# Patient Record
Sex: Female | Born: 1950 | Race: Black or African American | Hispanic: No | Marital: Single | Smoking: Current every day smoker
Health system: Southern US, Community
[De-identification: ages and names within clinical notes are randomized; demographics above are authoritative.]

## PROBLEM LIST (undated history)

## (undated) DIAGNOSIS — N189 Chronic kidney disease, unspecified: Secondary | ICD-10-CM

## (undated) DIAGNOSIS — E785 Hyperlipidemia, unspecified: Secondary | ICD-10-CM

## (undated) DIAGNOSIS — F17200 Nicotine dependence, unspecified, uncomplicated: Secondary | ICD-10-CM

## (undated) DIAGNOSIS — D241 Benign neoplasm of right breast: Secondary | ICD-10-CM

## (undated) DIAGNOSIS — E119 Type 2 diabetes mellitus without complications: Secondary | ICD-10-CM

## (undated) DIAGNOSIS — I10 Essential (primary) hypertension: Secondary | ICD-10-CM

## (undated) HISTORY — DX: Hyperlipidemia, unspecified: E78.5

## (undated) HISTORY — DX: Essential (primary) hypertension: I10

## (undated) HISTORY — DX: Type 2 diabetes mellitus without complications: E11.9

## (undated) HISTORY — PX: BREAST EXCISIONAL BIOPSY: SUR124

---

## 1998-10-22 ENCOUNTER — Encounter: Payer: Self-pay | Admitting: Family Medicine

## 1998-10-22 ENCOUNTER — Ambulatory Visit (HOSPITAL_COMMUNITY): Admission: RE | Admit: 1998-10-22 | Discharge: 1998-10-22 | Payer: Self-pay | Admitting: Family Medicine

## 1998-12-04 HISTORY — PX: TOTAL ABDOMINAL HYSTERECTOMY: SHX209

## 1998-12-13 ENCOUNTER — Other Ambulatory Visit: Admission: RE | Admit: 1998-12-13 | Discharge: 1998-12-13 | Payer: Self-pay | Admitting: Obstetrics

## 1999-01-05 ENCOUNTER — Inpatient Hospital Stay (HOSPITAL_COMMUNITY): Admission: AD | Admit: 1999-01-05 | Discharge: 1999-01-09 | Payer: Self-pay | Admitting: Family Medicine

## 1999-01-17 ENCOUNTER — Encounter: Admission: RE | Admit: 1999-01-17 | Discharge: 1999-04-17 | Payer: Self-pay | Admitting: Family Medicine

## 1999-01-25 ENCOUNTER — Inpatient Hospital Stay (HOSPITAL_COMMUNITY): Admission: RE | Admit: 1999-01-25 | Discharge: 1999-01-28 | Payer: Self-pay | Admitting: Obstetrics

## 1999-01-25 ENCOUNTER — Encounter: Payer: Self-pay | Admitting: Obstetrics

## 2000-03-01 ENCOUNTER — Other Ambulatory Visit: Admission: RE | Admit: 2000-03-01 | Discharge: 2000-03-01 | Payer: Self-pay | Admitting: Obstetrics

## 2000-03-13 ENCOUNTER — Ambulatory Visit (HOSPITAL_COMMUNITY): Admission: RE | Admit: 2000-03-13 | Discharge: 2000-03-13 | Payer: Self-pay | Admitting: Obstetrics

## 2000-03-13 ENCOUNTER — Encounter: Payer: Self-pay | Admitting: Obstetrics

## 2000-03-20 ENCOUNTER — Encounter: Admission: RE | Admit: 2000-03-20 | Discharge: 2000-03-20 | Payer: Self-pay | Admitting: Obstetrics

## 2000-03-20 ENCOUNTER — Encounter: Payer: Self-pay | Admitting: Obstetrics

## 2002-05-21 ENCOUNTER — Encounter: Payer: Self-pay | Admitting: Family Medicine

## 2002-05-21 ENCOUNTER — Ambulatory Visit (HOSPITAL_COMMUNITY): Admission: RE | Admit: 2002-05-21 | Discharge: 2002-05-21 | Payer: Self-pay | Admitting: Family Medicine

## 2002-09-11 ENCOUNTER — Other Ambulatory Visit: Admission: RE | Admit: 2002-09-11 | Discharge: 2002-09-11 | Payer: Self-pay | Admitting: Family Medicine

## 2008-06-17 ENCOUNTER — Ambulatory Visit: Payer: Self-pay | Admitting: Internal Medicine

## 2008-06-17 LAB — CONVERTED CEMR LAB
ALT: 8 units/L (ref 0–35)
Albumin: 4 g/dL (ref 3.5–5.2)
Basophils Absolute: 0 10*3/uL (ref 0.0–0.1)
CO2: 24 meq/L (ref 19–32)
Calcium: 9.5 mg/dL (ref 8.4–10.5)
Chloride: 101 meq/L (ref 96–112)
Creatinine, Ser: 0.56 mg/dL (ref 0.40–1.20)
Hemoglobin: 11.4 g/dL — ABNORMAL LOW (ref 12.0–15.0)
Lymphocytes Relative: 36 % (ref 12–46)
Monocytes Absolute: 0.3 10*3/uL (ref 0.1–1.0)
Neutro Abs: 2.6 10*3/uL (ref 1.7–7.7)
Neutrophils Relative %: 58 % (ref 43–77)
Potassium: 3.6 meq/L (ref 3.5–5.3)
RBC: 3.87 M/uL (ref 3.87–5.11)
RDW: 13.5 % (ref 11.5–15.5)
TSH: 1.273 microintl units/mL (ref 0.350–4.50)
Total Protein: 7 g/dL (ref 6.0–8.3)

## 2008-06-18 ENCOUNTER — Ambulatory Visit (HOSPITAL_COMMUNITY): Admission: RE | Admit: 2008-06-18 | Discharge: 2008-06-18 | Payer: Self-pay | Admitting: Family Medicine

## 2008-06-22 ENCOUNTER — Ambulatory Visit (HOSPITAL_COMMUNITY): Admission: RE | Admit: 2008-06-22 | Discharge: 2008-06-22 | Payer: Self-pay | Admitting: Internal Medicine

## 2008-06-24 ENCOUNTER — Ambulatory Visit: Payer: Self-pay | Admitting: Internal Medicine

## 2008-06-29 ENCOUNTER — Ambulatory Visit: Payer: Self-pay | Admitting: *Deleted

## 2008-07-10 ENCOUNTER — Ambulatory Visit: Payer: Self-pay | Admitting: Internal Medicine

## 2008-12-17 ENCOUNTER — Ambulatory Visit: Payer: Self-pay | Admitting: Internal Medicine

## 2008-12-17 LAB — CONVERTED CEMR LAB
AST: 10 units/L (ref 0–37)
Albumin: 4 g/dL (ref 3.5–5.2)
Alkaline Phosphatase: 45 units/L (ref 39–117)
Basophils Relative: 0 % (ref 0–1)
Eosinophils Absolute: 0.1 10*3/uL (ref 0.0–0.7)
Eosinophils Relative: 2 % (ref 0–5)
HCT: 36.4 % (ref 36.0–46.0)
HDL: 59 mg/dL (ref >39)
LDL Cholesterol: 109 mg/dL — ABNORMAL HIGH (ref 0–99)
MCHC: 34.1 g/dL (ref 30.0–36.0)
MCV: 87.5 fL (ref 78.0–100.0)
Neutrophils Relative %: 41 % — ABNORMAL LOW (ref 43–77)
Platelets: 230 10*3/uL (ref 150–400)
Potassium: 3.7 meq/L (ref 3.5–5.3)
RDW: 13.5 % (ref 11.5–15.5)
Sodium: 144 meq/L (ref 135–145)
Total Bilirubin: 0.7 mg/dL (ref 0.3–1.2)
Total Protein: 7 g/dL (ref 6.0–8.3)
Triglycerides: 77 mg/dL (ref <150)
VLDL: 15 mg/dL (ref 0–40)

## 2008-12-18 ENCOUNTER — Encounter (INDEPENDENT_AMBULATORY_CARE_PROVIDER_SITE_OTHER): Payer: Self-pay | Admitting: Internal Medicine

## 2008-12-23 ENCOUNTER — Ambulatory Visit: Payer: Self-pay | Admitting: Internal Medicine

## 2008-12-24 ENCOUNTER — Ambulatory Visit (HOSPITAL_COMMUNITY): Admission: RE | Admit: 2008-12-24 | Discharge: 2008-12-24 | Payer: Self-pay | Admitting: Internal Medicine

## 2009-01-20 ENCOUNTER — Ambulatory Visit: Payer: Self-pay | Admitting: Internal Medicine

## 2009-01-20 ENCOUNTER — Encounter: Payer: Self-pay | Admitting: Internal Medicine

## 2009-02-01 ENCOUNTER — Encounter: Admission: RE | Admit: 2009-02-01 | Discharge: 2009-02-25 | Payer: Self-pay | Admitting: Internal Medicine

## 2009-07-07 ENCOUNTER — Ambulatory Visit (HOSPITAL_COMMUNITY): Admission: RE | Admit: 2009-07-07 | Discharge: 2009-07-07 | Payer: Self-pay | Admitting: Internal Medicine

## 2009-08-05 ENCOUNTER — Ambulatory Visit: Payer: Self-pay | Admitting: Internal Medicine

## 2009-08-12 ENCOUNTER — Ambulatory Visit: Payer: Self-pay | Admitting: Internal Medicine

## 2009-08-30 ENCOUNTER — Ambulatory Visit: Payer: Self-pay | Admitting: Internal Medicine

## 2009-08-30 LAB — CONVERTED CEMR LAB
ALT: <8 units/L (ref 0–35)
AST: 11 units/L (ref 0–37)
CO2: 24 meq/L (ref 19–32)
Chloride: 110 meq/L (ref 96–112)
Cholesterol: 118 mg/dL (ref 0–200)
Creatinine, Ser: 0.71 mg/dL (ref 0.40–1.20)
Sodium: 144 meq/L (ref 135–145)
Total Bilirubin: 0.5 mg/dL (ref 0.3–1.2)
Total CHOL/HDL Ratio: 2.2
Total Protein: 6.7 g/dL (ref 6.0–8.3)

## 2009-09-09 ENCOUNTER — Ambulatory Visit: Payer: Self-pay | Admitting: Internal Medicine

## 2009-09-09 LAB — CONVERTED CEMR LAB
Cholesterol: 109 mg/dL (ref 0–200)
HDL: 59 mg/dL (ref >39)
Triglycerides: 50 mg/dL (ref <150)

## 2010-03-30 ENCOUNTER — Ambulatory Visit: Payer: Self-pay | Admitting: Family Medicine

## 2010-06-29 ENCOUNTER — Ambulatory Visit: Payer: Self-pay | Admitting: Internal Medicine

## 2010-08-11 ENCOUNTER — Ambulatory Visit: Payer: Self-pay | Admitting: Internal Medicine

## 2010-08-11 LAB — CONVERTED CEMR LAB
BUN: 22 mg/dL (ref 6–23)
CO2: 25 meq/L (ref 19–32)
Chloride: 107 meq/L (ref 96–112)
Creatinine, Ser: 0.88 mg/dL (ref 0.40–1.20)
Glucose, Bld: 138 mg/dL — ABNORMAL HIGH (ref 70–99)
Hgb A1c MFr Bld: 6.2 % — ABNORMAL HIGH (ref <5.7)
LDL Cholesterol: 56 mg/dL (ref 0–99)
Potassium: 5.3 meq/L (ref 3.5–5.3)
VLDL: 11 mg/dL (ref 0–40)

## 2010-09-01 ENCOUNTER — Ambulatory Visit (HOSPITAL_COMMUNITY): Admission: RE | Admit: 2010-09-01 | Discharge: 2010-09-01 | Payer: Self-pay | Admitting: Internal Medicine

## 2011-09-11 ENCOUNTER — Other Ambulatory Visit (HOSPITAL_COMMUNITY): Payer: Self-pay | Admitting: Family Medicine

## 2011-09-11 DIAGNOSIS — Z1231 Encounter for screening mammogram for malignant neoplasm of breast: Secondary | ICD-10-CM

## 2011-09-22 ENCOUNTER — Ambulatory Visit (HOSPITAL_COMMUNITY)
Admission: RE | Admit: 2011-09-22 | Discharge: 2011-09-22 | Disposition: A | Discharge status: Home / Self Care | Payer: Self-pay | Source: Ambulatory Visit | Attending: Family Medicine | Admitting: Family Medicine

## 2011-09-22 DIAGNOSIS — Z1231 Encounter for screening mammogram for malignant neoplasm of breast: Secondary | ICD-10-CM | POA: Insufficient documentation

## 2012-12-26 ENCOUNTER — Emergency Department (HOSPITAL_COMMUNITY)
Admission: EM | Admit: 2012-12-26 | Discharge: 2012-12-26 | Disposition: A | Discharge status: Home / Self Care | Payer: No Typology Code available for payment source | Source: Home / Self Care | Attending: Family Medicine | Admitting: Family Medicine

## 2012-12-26 ENCOUNTER — Encounter (HOSPITAL_COMMUNITY): Payer: Self-pay

## 2012-12-26 DIAGNOSIS — I10 Essential (primary) hypertension: Secondary | ICD-10-CM

## 2012-12-26 DIAGNOSIS — E119 Type 2 diabetes mellitus without complications: Secondary | ICD-10-CM

## 2012-12-26 DIAGNOSIS — E785 Hyperlipidemia, unspecified: Secondary | ICD-10-CM

## 2012-12-26 LAB — LIPID PANEL
Cholesterol: 124 mg/dL (ref 0–200)
HDL: 61 mg/dL (ref >39)
LDL Cholesterol: 54 mg/dL (ref 0–99)
Triglycerides: 45 mg/dL (ref <150)

## 2012-12-26 LAB — COMPREHENSIVE METABOLIC PANEL
ALT: 8 U/L (ref 0–35)
Alkaline Phosphatase: 61 U/L (ref 39–117)
Chloride: 107 mEq/L (ref 96–112)
GFR calc Af Amer: 43 mL/min — ABNORMAL LOW (ref >90)
Glucose, Bld: 74 mg/dL (ref 70–99)
Potassium: 5.3 mEq/L — ABNORMAL HIGH (ref 3.5–5.1)
Sodium: 142 mEq/L (ref 135–145)
Total Bilirubin: 0.2 mg/dL — ABNORMAL LOW (ref 0.3–1.2)
Total Protein: 8.4 g/dL — ABNORMAL HIGH (ref 6.0–8.3)

## 2012-12-26 MED ORDER — METFORMIN HCL 1000 MG PO TABS: 1000.0000 mg | ORAL_TABLET | Freq: Two times a day (BID) | ORAL | Status: DC

## 2012-12-26 MED ORDER — GLIPIZIDE 5 MG PO TABS: 5.0000 mg | ORAL_TABLET | ORAL | Status: DC

## 2012-12-26 MED ORDER — HYDROCHLOROTHIAZIDE 12.5 MG PO TABS: 12.5000 mg | ORAL_TABLET | Freq: Every day | ORAL | Status: DC

## 2012-12-26 MED ORDER — PRAVASTATIN SODIUM 20 MG PO TABS: 20.0000 mg | ORAL_TABLET | Freq: Every day | ORAL | Status: DC

## 2012-12-26 MED ORDER — LISINOPRIL 40 MG PO TABS: 40.0000 mg | ORAL_TABLET | Freq: Every day | ORAL | Status: DC

## 2012-12-26 NOTE — ED Notes (Signed)
[facility] client- history of DM hypertension-needs medication refills

## 2012-12-26 NOTE — Progress Notes (Signed)
Quick Note:  Please call patient and later her kidney function has come back abnormal. Her kidneys function is diminished. This may be from acute dehydration but I'm recommending that she stop taking the metformin and lisinopril for the next several days. Don't restart taking the lisinopril or the metformin until she's had repeat labs done and we tell her that is okay to restart it. I would like for her to continue taking her hydrochlorothiazide but I would like for her to take 2 tablets daily because her potassium is elevated at this time. I would like for her to drink a lot of clear fluids for the next several days to flush her kidneys and I would like her to return to have a lab draw to have her labs rechecked in one week. Her kidney function from 2011 was normal. I am not sure if her kidneys have been declining over the past 2 years because we don't have any labs from 2013 or 2012 to compare.   Gerlene Fee, MD, CDE, [place], [place]   ______

## 2012-12-26 NOTE — Progress Notes (Signed)
Quick Note:  I called patient and notified her of the results and to have her stop lisinopril and metformin until we are able to recheck her labs next week after she rehydrates. She says she was never told that she had any renal insufficiency. Please call patient and have her come in to the office for a nurse visit for labs in 1 week. She needs a CMP test done.    Gerlene Fee, MD, CDE, [place], [place]   ______

## 2012-12-26 NOTE — ED Provider Notes (Signed)
History     CSN: [id number]  Arrival date & time 12/26/12  1034   First MD Initiated Contact with Patient 12/26/12 1120      Chief Complaint  Patient presents with  . Medication Refill    (Consider location/radiation/quality/duration/timing/severity/associated sxs/prior treatment) HPI Pt says that she has been working hard to control her diet.  Pt says that she is taking her medications as prescribed.  She had her labs done with [facility] but it is time to have labs done again.      History reviewed. No pertinent past medical history.  History reviewed. No pertinent past surgical history.  No family history on file.  History  Substance Use Topics  . Smoking status: Light Tobacco Smoker  . Smokeless tobacco: Not on file  . Alcohol Use: No    OB History    Grav Para Term Preterm Abortions TAB SAB Ect Mult Living                 Review of Systems  HENT: Positive for congestion and sneezing.   Musculoskeletal: Positive for arthralgias.  All other systems reviewed and are negative.    Allergies  Review of patient's allergies indicates no known allergies.  Home Medications   Current Outpatient Rx  Name  Route  Sig  Dispense  Refill  . ASPIRIN 81 MG PO TABS   Oral   Take 81 mg by mouth daily.         . ATORVASTATIN CALCIUM 10 MG PO TABS   Oral   Take 10 mg by mouth daily.         Marland Kitchen GLIPIZIDE 5 MG PO TABS   Oral   Take 5 mg by mouth 1 day or 1 dose.         Marland Kitchen LISINOPRIL 40 MG PO TABS   Oral   Take 40 mg by mouth daily.         Marland Kitchen METFORMIN HCL ER (OSM) 1000 MG PO TB24   Oral   Take 1,000 mg by mouth 2 (two) times daily with a meal.           BP 172/72  Pulse 76  Temp 98.1 F (36.7 C) (Oral)  Resp 18  SpO2 100%  Physical Exam  Nursing note and vitals reviewed. Constitutional: She is oriented to person, place, and time. She appears well-developed and well-nourished.  HENT:  Head: Normocephalic and atraumatic.  Eyes: EOM are normal.  Pupils are equal, round, and reactive to light.  Neck: Normal range of motion. Neck supple.  Cardiovascular: Normal rate, regular rhythm and normal heart sounds.   Pulmonary/Chest: Effort normal and breath sounds normal.  Abdominal: Soft. Bowel sounds are normal.  Musculoskeletal: Normal range of motion. She exhibits no edema and no tenderness.  Neurological: She is alert and oriented to person, place, and time.  Skin: Skin is warm and dry.  Psychiatric: She has a normal mood and affect. Her behavior is normal. Judgment and thought content normal.    ED Course  Procedures (including critical care time)  Labs Reviewed - No data to display No results found.   No diagnosis found.   MDM  IMPRESSION  HTN  Hyperlipidemia  Type 2 DM  RECOMMENDATIONS / PLAN Check labs today Added HCTZ 12.5 mg po daily Refilled lisinopril 40 mg po daily and pravastatin Followup lab results  FOLLOW UP 3 months   The patient was given clear instructions to go to ER or return to  medical center if symptoms don't improve, worsen or new problems develop.  The patient verbalized understanding.  The patient was told to call to get lab results if they haven't heard anything in the next week.            Murlean Iba, MD 12/26/12 1216

## 2012-12-31 ENCOUNTER — Telehealth (HOSPITAL_COMMUNITY): Payer: Self-pay

## 2012-12-31 NOTE — Telephone Encounter (Signed)
Message copied by Dorothe Pea on Tue Dec 31, 2012  9:43 AM ------      Message from: Murlean Iba      Created: Thu Dec 26, 2012  6:51 PM       Please call patient and later her kidney function has come back abnormal.  Her kidneys function is diminished.  This may be from acute dehydration but I'm recommending that she stop taking the metformin and lisinopril for the next several days.  Don't restart taking the lisinopril or the metformin until she's had repeat labs done and we tell her that is okay to restart it.  I would like for her to continue taking her hydrochlorothiazide but I would like for her to take 2 tablets daily because her potassium is elevated at this time.  I would like for her to drink a lot of clear fluids for the next several days to flush her kidneys and I would like her to return to have a lab draw to have her labs rechecked in one week.  Her kidney function from 2011 was normal.  I am not sure if her kidneys have been declining over the past 2 years because we don't have any labs from 2013 or 2012 to compare.                  Gerlene Fee, MD, CDE, [place], [place]

## 2013-01-06 ENCOUNTER — Emergency Department (INDEPENDENT_AMBULATORY_CARE_PROVIDER_SITE_OTHER)
Admission: EM | Admit: 2013-01-06 | Discharge: 2013-01-06 | Disposition: A | Discharge status: Home / Self Care | Payer: No Typology Code available for payment source | Source: Home / Self Care

## 2013-01-06 ENCOUNTER — Encounter (HOSPITAL_COMMUNITY): Payer: Self-pay

## 2013-01-06 DIAGNOSIS — I10 Essential (primary) hypertension: Secondary | ICD-10-CM

## 2013-01-06 LAB — COMPREHENSIVE METABOLIC PANEL
AST: 19 U/L (ref 0–37)
CO2: 24 mEq/L (ref 19–32)
Calcium: 10.1 mg/dL (ref 8.4–10.5)
Creatinine, Ser: 1.33 mg/dL — ABNORMAL HIGH (ref 0.50–1.10)
GFR calc Af Amer: 48 mL/min — ABNORMAL LOW (ref >90)
GFR calc non Af Amer: 42 mL/min — ABNORMAL LOW (ref >90)
Glucose, Bld: 63 mg/dL — ABNORMAL LOW (ref 70–99)
Total Protein: 8.2 g/dL (ref 6.0–8.3)

## 2013-01-06 NOTE — ED Notes (Signed)
Pat;ient here for a repeat blood test-cmp

## 2013-01-08 NOTE — Progress Notes (Signed)
Quick Note:  Please call patient and tell her that her kidney function still came back abnormal. Her kidneys have been damaged by her high blood pressure and diabetes. She needs to [place] now because of her diminished kidney function. She can take her blood pressure meds. Her potassium has returned to normal. She should be taking 25 mg of HCTZ (hydrochlorothiazide) now as we had discussed on the phone and lisinopril. She needs to come in for an office visit in 1 month and get labs done when you come in to the clinic in 1 month to recheck BMP.   Gerlene Fee, MD, CDE, [place], [place]   ______

## 2013-01-09 ENCOUNTER — Telehealth (HOSPITAL_COMMUNITY): Payer: Self-pay

## 2013-01-09 NOTE — Telephone Encounter (Signed)
Message copied by Dorothe Pea on Thu Jan 09, 2013  9:30 AM ------      Message from: Murlean Iba      Created: Wed Jan 08, 2013  8:47 AM       Please call patient and tell her that her kidney function still came back abnormal.  Her kidneys have been damaged by her high blood pressure and diabetes.  She needs to [place] now because of her diminished kidney function.  She can take her blood pressure meds.  Her potassium has returned to normal. She should be taking 25 mg of HCTZ (hydrochlorothiazide) now as we had discussed on the phone and lisinopril.  She needs to come in for an office visit in 1 month and get labs done when you come in to the clinic in 1 month to recheck BMP.              Gerlene Fee, MD, CDE, [place], [place]

## 2013-05-07 ENCOUNTER — Ambulatory Visit: Payer: No Typology Code available for payment source | Attending: Family Medicine | Admitting: Family Medicine

## 2013-05-07 VITALS — BP 200/84 | HR 82 | Temp 98.7°F | Resp 14 | Ht 61.75 in | Wt 141.0 lb

## 2013-05-07 DIAGNOSIS — N184 Chronic kidney disease, stage 4 (severe): Secondary | ICD-10-CM | POA: Insufficient documentation

## 2013-05-07 DIAGNOSIS — N183 Chronic kidney disease, stage 3 unspecified: Secondary | ICD-10-CM

## 2013-05-07 DIAGNOSIS — F17200 Nicotine dependence, unspecified, uncomplicated: Secondary | ICD-10-CM

## 2013-05-07 DIAGNOSIS — N189 Chronic kidney disease, unspecified: Secondary | ICD-10-CM | POA: Insufficient documentation

## 2013-05-07 DIAGNOSIS — I10 Essential (primary) hypertension: Secondary | ICD-10-CM

## 2013-05-07 DIAGNOSIS — E119 Type 2 diabetes mellitus without complications: Secondary | ICD-10-CM

## 2013-05-07 DIAGNOSIS — I129 Hypertensive chronic kidney disease with stage 1 through stage 4 chronic kidney disease, or unspecified chronic kidney disease: Secondary | ICD-10-CM | POA: Insufficient documentation

## 2013-05-07 LAB — LIPID PANEL
HDL: 62 mg/dL (ref >39)
LDL Cholesterol: 86 mg/dL (ref 0–99)
Triglycerides: 48 mg/dL (ref <150)
VLDL: 10 mg/dL (ref 0–40)

## 2013-05-07 LAB — COMPREHENSIVE METABOLIC PANEL
ALT: <8 U/L (ref 0–35)
AST: 15 U/L (ref 0–37)
Alkaline Phosphatase: 46 U/L (ref 39–117)
Creat: 1.16 mg/dL — ABNORMAL HIGH (ref 0.50–1.10)
Total Bilirubin: 0.3 mg/dL (ref 0.3–1.2)

## 2013-05-07 MED ORDER — GLIPIZIDE 5 MG PO TABS: 5.0000 mg | ORAL_TABLET | Freq: Every day | ORAL | Status: DC

## 2013-05-07 MED ORDER — ASPIRIN 81 MG PO TABS: 81.0000 mg | ORAL_TABLET | Freq: Every day | ORAL | Status: DC

## 2013-05-07 MED ORDER — HYDROCHLOROTHIAZIDE 12.5 MG PO TABS: 12.5000 mg | ORAL_TABLET | Freq: Every day | ORAL | Status: DC

## 2013-05-07 MED ORDER — LISINOPRIL 40 MG PO TABS: 40.0000 mg | ORAL_TABLET | Freq: Every day | ORAL | Status: DC

## 2013-05-07 MED ORDER — PRAVASTATIN SODIUM 20 MG PO TABS: 20.0000 mg | ORAL_TABLET | Freq: Every day | ORAL | Status: DC

## 2013-05-07 NOTE — Patient Instructions (Signed)
Low Blood Sugar Low blood sugar (hypoglycemia) means that the level of sugar in your blood is lower than it should be. Signs of low blood sugar include:  Getting sweaty.  Feeling hungry.  Feeling dizzy or weak.  Feeling sleepier than normal.  Feeling nervous.  Headaches.  Having a fast heartbeat. Low blood sugar can happen fast and can be an emergency. Your doctor can do tests to check your blood sugar level. You can have low blood sugar and not have diabetes. HOME CARE  Check your blood sugar as told by your doctor. If it is less than 70 mg/dl or as told by your doctor, take 1 of the following:  3 to 4 glucose tablets.   cup clear juice.   cup soda pop, not diet.  1 cup milk.  5 to 6 hard candies.  Recheck blood sugar after 15 minutes. Repeat until it is at the right level.  Eat a snack if it is more than 1 hour until the next meal.  Only take medicine as told by your doctor.  Do not skip meals. Eat on time.  Do not drink alcohol except with meals.  Check your blood glucose before driving.  Check your blood glucose before and after exercise.  Always carry treatment with you, such as glucose pills.  Always wear a medical alert bracelet if you have diabetes. GET HELP RIGHT AWAY IF:   Your blood glucose goes below 70 mg/dl or as told by your doctor, and you:  Are confused.  Are not able to swallow.  Pass out (faint).  You cannot treat yourself. You may need someone to help you.  You have low blood sugar problems often.  You have problems from your medicines.  You are not feeling better after 3 to 4 days.  You have vision changes. MAKE SURE YOU:   Understand these instructions.  Will watch this condition.  Will get help right away if you are not doing well or get worse. Document Released: 02/14/2010 Document Revised: 02/12/2012 Document Reviewed: 02/14/2010 [facility] Patient Information 2014 [place], [place]. Blood Sugar Monitoring,  Adult GLUCOSE METERS FOR SELF-MONITORING OF BLOOD GLUCOSE  It is important to be able to correctly measure your blood sugar (glucose). You can use a blood glucose monitor (a small battery-operated device) to check your glucose level at any time. This allows you and your caregiver to monitor your diabetes and to determine how well your treatment plan is working. The process of monitoring your blood glucose with a glucose meter is called self-monitoring of blood glucose (SMBG). When people with diabetes control their blood sugar, they have better health. To test for glucose with a typical glucose meter, place the disposable strip in the meter. Then place a small sample of blood on the "test strip." The test strip is coated with chemicals that combine with glucose in blood. The meter measures how much glucose is present. The meter displays the glucose level as a number. Several new models can record and store a number of test results. Some models can connect to personal computers to store test results or print them out.  Newer meters are often easier to use than older models. Some meters allow you to get blood from places other than your fingertip. Some new models have automatic timing, error codes, signals, or barcode readers to help with proper adjustment (calibration). Some meters have a large display screen or spoken instructions for people with visual impairments.  INSTRUCTIONS FOR USING GLUCOSE METERS  Wash your hands with soap and warm water, or clean the area with alcohol. Dry your hands completely.  Prick the side of your fingertip with a lancet (a sharp-pointed tool used by hand).  Hold the hand down and gently milk the finger until a small drop of blood appears. Catch the blood with the test strip.  Follow the instructions for inserting the test strip and using the SMBG meter. Most meters require the meter to be turned on and the test strip to be inserted before applying the blood  sample.  Record the test result.  Read the instructions carefully for both the meter and the test strips that go with it. Meter instructions are found in the user manual. Keep this manual to help you solve any problems that may arise. Many meters use "error codes" when there is a problem with the meter, the test strip, or the blood sample on the strip. You will need the manual to understand these error codes and fix the problem.  New devices are available such as laser lancets and meters that can test blood taken from "alternative sites" of the body, other than fingertips. However, you should use standard fingertip testing if your glucose changes rapidly. Also, use standard testing if:  You have eaten, exercised, or taken insulin in the past 2 hours.  You think your glucose is low.  You tend to not feel symptoms of low blood glucose (hypoglycemia).  You are ill or under stress.  Clean the meter as directed by the manufacturer.  Test the meter for accuracy as directed by the manufacturer.  Take your meter with you to your caregiver's office. This way, you can test your glucose in front of your caregiver to make sure you are using the meter correctly. Your caregiver can also take a sample of blood to test using a routine lab method. If values on the glucose meter are close to the lab results, you and your caregiver will see that your meter is working well and you are using good technique. Your caregiver will advise you about what to do if the results do not match. FREQUENCY OF TESTING  Your caregiver will tell you how often you should check your blood glucose. This will depend on your type of diabetes, your current level of diabetes control, and your types of medicines. The following are general guidelines, but your care plan may be different. Record all your readings and the time of day you took them for review with your caregiver.   Diabetes type 1.  When you are using insulin with good  diabetic control (either multiple daily injections or via a pump), you should check your glucose 4 times a day.  If your diabetes is not well controlled, you may need to monitor more frequently, including before meals and 2 hours after meals, at bedtime, and occasionally between 2 a.m. and 3 a.m.  You should always check your glucose before a dose of insulin or before changing the rate on your insulin pump.  Diabetes type 2.  Guidelines for SMBG in diabetes type 2 are not as well defined.  If you are on insulin, follow the guidelines above.  If you are on medicines, but not insulin, and your glucose is not well controlled, you should test at least twice daily.  If you are not on insulin, and your diabetes is controlled with medicines or diet alone, you should test at least once daily, usually before breakfast.  A weekly profile will  help your caregiver advise you on your care plan. The week before your visit, check your glucose before a meal and 2 hours after a meal at least daily. You may want to test before and after a different meal each day so you and your caregiver can tell how well controlled your blood sugars are throughout the course of a 24 hour period.  Gestational diabetes (diabetes during pregnancy).  Frequent testing is often necessary. Accurate timing is important.  If you are not on insulin, check your glucose 4 times a day. Check it before breakfast and 1 hour after the start of each meal.  If you are on insulin, check your glucose 6 times a day. Check it before each meal and 1 hour after the first bite of each meal.  General guidelines.  More frequent testing is required at the start of insulin treatment. Your caregiver will instruct you.  Test your glucose any time you suspect you have low blood sugar (hypoglycemia).  You should test more often when you change medicines, when you have unusual stress or illness, or in other unusual circumstances. OTHER THINGS TO KNOW  ABOUT GLUCOSE METERS  Measurement Range. Most glucose meters are able to read glucose levels over a broad range of values from as low as 0 to as high as 600 mg/dL. If you get an extremely high or low reading from your meter, you should first confirm it with another reading. Report very high or very low readings to your caregiver.  Whole Blood Glucose versus Plasma Glucose. Some older home glucose meters measure glucose in your whole blood. In a lab or when using some newer home glucose meters, the glucose is measured in your plasma (one component of blood). The difference can be important. It is important for you and your caregiver to know whether your meter gives its results as "whole blood equivalent" or "plasma equivalent."  Display of High and Low Glucose Values. Part of learning how to operate a meter is understanding what the meter results mean. Know how high and low glucose concentrations are displayed on your meter.  Factors that Affect Glucose Meter Performance. The accuracy of your test results depends on many factors and varies depending on the brand and type of meter. These factors include:  Low red blood cell count (anemia).  Substances in your blood (such as uric acid, vitamin C, and others).  Environmental factors (temperature, humidity, altitude).  Name-brand versus generic test strips.  Calibration. Make sure your meter is set up properly. It is a good idea to do a calibration test with a control solution recommended by the manufacturer of your meter whenever you begin using a fresh bottle of test strips. This will help verify the accuracy of your meter.  Improperly stored, expired, or defective test strips. Keep your strips in a dry place with the lid on.  Soiled meter.  Inadequate blood sample. NEW TECHNOLOGIES FOR GLUCOSE TESTING Alternative site testing Some glucose meters allow testing blood from alternative sites. These include the:  Upper arm.  Forearm.  Base  of the thumb.  Thigh. Sampling blood from alternative sites may be desirable. However, it may have some limitations. Blood in the fingertips show changes in glucose levels more quickly than blood in other parts of the body. This means that alternative site test results may be different from fingertip test results, not because of the meter's ability to test accurately, but because the actual glucose concentration can be different.  Continuous Glucose  Monitoring Devices to measure your blood glucose continuously are available, and others are in development. These methods can be more expensive than self-monitoring with a glucose meter. However, it is uncertain how effective and reliable these devices are. Your caregiver will advise you if this approach makes sense for you. IF BLOOD SUGARS ARE CONTROLLED, PEOPLE WITH DIABETES REMAIN HEALTHIER.  SMBG is an important part of the treatment plan of patients with diabetes mellitus. Below are reasons for using SMBG:   It confirms that your glucose is at a specific, healthy level.  It detects hypoglycemia and severe hyperglycemia.  It allows you and your caregiver to make adjustments in response to changes in lifestyle for individuals requiring medicine.  It determines the need for starting insulin therapy in temporary diabetes that happens during pregnancy (gestational diabetes). Document Released: 11/23/2003 Document Revised: 02/12/2012 Document Reviewed: 03/16/2011 [facility] Patient Information 2014 [place].  Hypertension Hypertension is another name for high blood pressure. High blood pressure may mean that your heart needs to work harder to pump blood. Blood pressure consists of two numbers, which includes a higher number over a lower number (example: 110/72). HOME CARE   Make lifestyle changes as told by your doctor. This may include weight loss and exercise.  Take your blood pressure medicine every day.  Limit how much salt you  use.  Stop smoking if you smoke.  Do not use drugs.  Talk to your doctor if you are using decongestants or birth control pills. These medicines might make blood pressure higher.  Females should not drink more than 1 alcoholic drink per day. Males should not drink more than 2 alcoholic drinks per day.  See your doctor as told. GET HELP RIGHT AWAY IF:   You have a blood pressure reading with a top number of 180 or higher.  You get a very bad headache.  You get blurred or changing vision.  You feel confused.  You feel weak, numb, or faint.  You get chest or belly (abdominal) pain.  You throw up (vomit).  You cannot breathe very well. MAKE SURE YOU:   Understand these instructions.  Will watch your condition.  Will get help right away if you are not doing well or get worse. Document Released: 05/08/2008 Document Revised: 02/12/2012 Document Reviewed: 05/08/2008 [facility] Patient Information 2014 [place], [place]. Chronic Kidney Disease Chronic kidney disease occurs when the kidneys are damaged over a long period. The kidneys are two organs that lie on either side of the spine between the middle of the back and the front of the abdomen. The kidneys:   Remove wastes and extra water from the blood.   Produce important hormones. These help keep bones strong, regulate blood pressure, and help create red blood cells.   Balance the fluids and chemicals in the blood and tissues. A small amount of kidney damage may not cause problems, but a large amount of damage may make it difficult or impossible for the kidneys to work the way they should. If steps are not taken to slow down the kidney damage or stop it from getting worse, the kidneys may stop working permanently. Most of the time, chronic kidney disease does not go away. However, it can often be controlled, and those with the disease can usually live normal lives. CAUSES  The most common causes of chronic kidney disease are  diabetes and high blood pressure (hypertension). Chronic kidney disease may also be caused by:   Diseases that cause kidneys' filters to become  inflamed.   Diseases that affect the immune system.   Genetic diseases.   Medicines that damage the kidneys, such as anti-inflammatory medicines.  Poisoning or exposure to toxic substances.   A reoccurring kidney or urinary infection.   A problem with urine flow. This may be caused by:   Cancer.   Kidney stones.   An enlarged prostate in males. SYMPTOMS  Because the kidney damage in chronic kidney disease occurs slowly, symptoms develop slowly and may not be obvious until the kidney damage becomes severe. A person may have a kidney disease for years without showing any symptoms. Symptoms can include:   Swelling (edema) of the legs, ankles, or feet.   Tiredness (lethargy).   Nausea or vomiting.   Confusion.   Problems with urination, such as:   Decreased urine production.   Frequent urination, especially at night.   Frequent accidents in children who are potty trained.   Muscle twitches and cramps.   Shortness of breath.  Weakness.   Persistent itchiness.   Loss of appetite.  Metallic taste in the mouth.  Trouble sleeping.  Slowed development in children.  Short stature in children. DIAGNOSIS  Chronic kidney disease may be detected and diagnosed by tests, including blood, urine, imaging, or kidney biopsy tests.  TREATMENT  Most chronic kidney diseases cannot be cured. Treatment usually involves relieving symptoms and preventing or slowing the progression of the disease. Treatment may include:   A special diet. You may need to avoid alcohol and foods thatare salty and high in potassium.   Medicines. These may:   Lower blood pressure.   Relieve anemia.   Relieve swelling.   Protect the bones. HOME CARE INSTRUCTIONS   Follow your prescribed diet.   Only take over-the-counter  or prescription medicines as directed by your caregiver.  Do not take any new medicines (prescription, over-the-counter, or nutritional supplements) unless approved by your caregiver. Many medicines can worsen your kidney damage or need to have the dose adjusted.   Quit smoking if you are a smoker. Talk to your caregiver about a smoking cessation program.   Keep all follow-up appointments as directed by your caregiver. SEEK IMMEDIATE MEDICAL CARE IF:  Your symptoms get worse or you develop new symptoms.   You develop symptoms of end-stage kidney disease. These include:   Headaches.   Abnormally dark or light skin.   Numbness in the hands or feet.   Easy bruising.   Frequent hiccups.   Menstruation stops.   You have a fever.   You have decreased urine production.   You havepain or bleeding when urinating. MAKE SURE YOU:  Understand these instructions.  Will watch your condition.  Will get help right away if you are not doing well or get worse. FOR MORE INFORMATION  American Association of Kidney Patients: BombTimer.gl National Kidney Foundation: www.kidney.[place]: https://mathis.com/ [facility]: www.lifeoptions.org and www.kidneyschool.org Document Released: 08/29/2008 Document Revised: 11/06/2012 Document Reviewed: 07/19/2012 [facility] Patient Information 2014 [place], [place].

## 2013-05-07 NOTE — Progress Notes (Signed)
Patient ID: Andrea Brown, female   DOB: 1951/04/01, 62 y.o.   MRN: [id number]  CC: follow up   HPI: Pt reports that she has been without her medications for several days and requesting refills.  Pt says that she has not been having headaches but no CP, no SOB.  She is still smoking but not willing to try quitting at this time.  Pt denies low blood sugars.  Pt says that she could not afford to get her meds at [facility] after [facility] closed down.   No Known Allergies History reviewed. No pertinent past medical history. Current Outpatient Prescriptions on File Prior to Visit  Medication Sig Dispense Refill  . [DISCONTINUED] atorvastatin (LIPITOR) 10 MG tablet Take 10 mg by mouth daily.       No current facility-administered medications on file prior to visit.   Family History  Problem Relation Age of Onset  . Diabetes Mother   . Hypertension Mother    History   Social History  . Marital Status: Single    Spouse Name: N/A    Number of Children: N/A  . Years of Education: N/A   Occupational History  . Not on file.   Social History Main Topics  . Smoking status: Light Tobacco Smoker  . Smokeless tobacco: Not on file  . Alcohol Use: No  . Drug Use: Not on file  . Sexually Active: Not on file   Other Topics Concern  . Not on file   Social History Narrative  . No narrative on file    Review of Systems  Constitutional: Negative for fever, chills, diaphoresis, activity change, appetite change and fatigue.  HENT: Negative for ear pain, nosebleeds, congestion, facial swelling, rhinorrhea, neck pain, neck stiffness and ear discharge.   Eyes: Negative for pain, discharge, redness, itching and visual disturbance.  Respiratory: Negative for cough, choking, chest tightness, shortness of breath, wheezing and stridor.   Cardiovascular: Negative for chest pain, palpitations and leg swelling.  Gastrointestinal: Negative for abdominal distention.  Genitourinary: Negative for dysuria,  urgency, frequency, hematuria, flank pain, decreased urine volume, difficulty urinating and dyspareunia.  Musculoskeletal: Negative for back pain, joint swelling, arthralgias and gait problem.  Neurological: Negative for dizziness, tremors, seizures, syncope, facial asymmetry, speech difficulty, weakness, light-headedness, numbness and positive headaches.  Hematological: Negative for adenopathy. Does not bruise/bleed easily.  Psychiatric/Behavioral: Negative for hallucinations, behavioral problems, confusion, dysphoric mood, decreased concentration and agitation.    Objective:   Filed Vitals:   05/07/13 0929  BP: 200/84  Pulse: 82  Temp: 98.7 F (37.1 C)  Resp: 14    Physical Exam  Constitutional: Appears well-developed and well-nourished. No distress.  HENT: Normocephalic. External right and left ear normal. Oropharynx is clear and moist. poor dentition Eyes: Conjunctivae and EOM are normal. PERRLA, no scleral icterus.  Neck: Normal ROM. Neck supple. No JVD. No tracheal deviation. No thyromegaly.  CVS: RRR, S1/S2 +, no murmurs, no gallops, no carotid bruit.  Pulmonary: Effort and breath sounds normal, no stridor, rhonchi, wheezes, rales.  Abdominal: Soft. BS +,  no distension, tenderness, rebound or guarding.  Musculoskeletal: Normal range of motion. No edema and no tenderness.  Lymphadenopathy: No lymphadenopathy noted, cervical, inguinal. Neuro: Alert. Normal reflexes, muscle tone coordination. No cranial nerve deficit. Skin: Skin is warm and dry. No rash noted. Not diaphoretic. No erythema. No pallor.  Psychiatric: Normal mood and affect. Behavior, judgment, thought content normal.   Lab Results  Component Value Date   WBC 4.1  12/17/2008   HGB 12.4 12/17/2008   HCT 36.4 12/17/2008   MCV 87.5 12/17/2008   PLT 230 12/17/2008   Lab Results  Component Value Date   CREATININE 1.33* 01/06/2013   BUN 31* 01/06/2013   NA 140 01/06/2013   K 4.9 01/06/2013   CL 105 01/06/2013   CO2 24  01/06/2013    Lab Results  Component Value Date   HGBA1C 5.9* 12/26/2012   Lipid Panel     Component Value Date/Time   CHOL 124 12/26/2012 1152   TRIG 45 12/26/2012 1152   HDL 61 12/26/2012 1152   CHOLHDL 2.0 12/26/2012 1152   VLDL 9 12/26/2012 1152   LDLCALC 54 12/26/2012 1152       Assessment and plan:   Patient Active Problem List   Diagnosis Date Noted  . Type II or unspecified type diabetes mellitus without mention of complication, not stated as uncontrolled 05/07/2013  . CKD (chronic kidney disease) 05/07/2013  . Unspecified essential hypertension 05/07/2013  . Smoker unmotivated to quit 05/07/2013   Pt advised to DC the metformin secondary to CKD and refer to nephrology for evaluation and for assistance with controlling her blood pressure Consider Add amlodipine 5 mg po daily to her blood pressure regimen if no improvement after she restarts her meds Refilled her meds Cautioned pt on hypoglycemia and diabetic foot care instructions given  The patient was counseled on the dangers of tobacco use, and was advised to quit.  Reviewed strategies to maximize success, including removing cigarettes and smoking materials from environment.  Check labs today and follow results  Follow up in 2 weeks for BP recheck   The patient was given clear instructions to go to ER or return to medical center if symptoms don't improve, worsen or new problems develop.  The patient verbalized understanding.  The patient was told to call to get lab results if they haven't heard anything in the next week.    Gerlene Fee, MD, CDE, [place], [place]

## 2013-05-07 NOTE — Progress Notes (Signed)
Patient states she has not had her medication since Sunday. Needs refills on all medication. Patient wants pharmacy changed to [facility]. I have changed in system.

## 2013-05-08 ENCOUNTER — Telehealth: Payer: Self-pay | Admitting: *Deleted

## 2013-05-08 NOTE — Progress Notes (Signed)
Quick Note:  Please inform patient that her labs came back OK. Her kidney function has improved slightly. Her diabetes test A1c came back at 5.6 which is really good. If she has any low blood sugars less than 80 she should stop taking her diabetes pill and call our office and report it. Please work on getting her blood pressure better controlled. Recheck labs in 3 months.   Gerlene Fee, MD, CDE, [place], [place]   ______

## 2013-05-08 NOTE — Telephone Encounter (Signed)
05/08/13 Patient not available message left with daughter Steffanie Dunn  To have patient call clinic for lab results. P.Shadoe Bethel,RN

## 2013-05-14 ENCOUNTER — Telehealth: Payer: Self-pay | Admitting: *Deleted

## 2013-05-14 NOTE — Telephone Encounter (Signed)
05/14/13 Attempt to reach patient not available at [phone] P.[facility]

## 2013-05-15 ENCOUNTER — Telehealth: Payer: Self-pay | Admitting: *Deleted

## 2013-05-15 NOTE — Telephone Encounter (Signed)
05/15/13 Attempt to reach patient at [phone] no answer no voice mail. P,Bette Brienza,RN

## 2013-05-21 ENCOUNTER — Ambulatory Visit: Payer: No Typology Code available for payment source | Attending: Family Medicine

## 2013-05-21 MED ORDER — AMLODIPINE BESYLATE 5 MG PO TABS: 5.0000 mg | ORAL_TABLET | Freq: Every day | ORAL | Status: DC

## 2013-05-21 NOTE — Progress Notes (Unsigned)
Patient presents for BP check. Patient instructed by Dr. Christen Bame to start taking Norvasc 5 mg and return to clinic in one week for office visit.

## 2013-05-28 ENCOUNTER — Encounter: Payer: Self-pay | Admitting: Family Medicine

## 2013-05-28 ENCOUNTER — Ambulatory Visit: Payer: No Typology Code available for payment source | Attending: Family Medicine | Admitting: Family Medicine

## 2013-05-28 VITALS — BP 204/79 | HR 69 | Temp 97.0°F | Resp 18 | Ht 61.5 in | Wt 145.0 lb

## 2013-05-28 DIAGNOSIS — F17200 Nicotine dependence, unspecified, uncomplicated: Secondary | ICD-10-CM

## 2013-05-28 DIAGNOSIS — I10 Essential (primary) hypertension: Secondary | ICD-10-CM

## 2013-05-28 DIAGNOSIS — N183 Chronic kidney disease, stage 3 unspecified: Secondary | ICD-10-CM

## 2013-05-28 DIAGNOSIS — E119 Type 2 diabetes mellitus without complications: Secondary | ICD-10-CM

## 2013-05-28 MED ORDER — AMLODIPINE BESYLATE 5 MG PO TABS: 5.0000 mg | ORAL_TABLET | Freq: Every day | ORAL | Status: DC

## 2013-05-28 NOTE — Progress Notes (Signed)
Patient ID: Andrea Brown, female   DOB: Feb 19, 1951, 62 y.o.   MRN: [id number]  CC: follow up   HPI: Pt says she needs a new Rx for amlodipine because [facility] was not able to fill the prescription.  Pt says that she otherwise has been well.  No Known Allergies No past medical history on file. Current Outpatient Prescriptions on File Prior to Visit  Medication Sig Dispense Refill  . aspirin 81 MG tablet Take 1 tablet (81 mg total) by mouth daily.  30 tablet    . glipiZIDE (GLUCOTROL) 5 MG tablet Take 1 tablet (5 mg total) by mouth daily with breakfast.  30 tablet  4  . hydrochlorothiazide (HYDRODIURIL) 12.5 MG tablet Take 1 tablet (12.5 mg total) by mouth daily.  30 tablet  4  . lisinopril (PRINIVIL,ZESTRIL) 40 MG tablet Take 1 tablet (40 mg total) by mouth daily.  30 tablet  4  . pravastatin (PRAVACHOL) 20 MG tablet Take 1 tablet (20 mg total) by mouth daily.  30 tablet  4  . [DISCONTINUED] atorvastatin (LIPITOR) 10 MG tablet Take 10 mg by mouth daily.       No current facility-administered medications on file prior to visit.   Family History  Problem Relation Age of Onset  . Diabetes Mother   . Hypertension Mother    History   Social History  . Marital Status: Single    Spouse Name: N/A    Number of Children: N/A  . Years of Education: N/A   Occupational History  . Not on file.   Social History Main Topics  . Smoking status: Light Tobacco Smoker  . Smokeless tobacco: Not on file  . Alcohol Use: No  . Drug Use: Not on file  . Sexually Active: Not on file   Other Topics Concern  . Not on file   Social History Narrative  . No narrative on file    Review of Systems  Constitutional: Negative for fever, chills, diaphoresis, activity change, appetite change and fatigue.  HENT: Negative for ear pain, nosebleeds, congestion, facial swelling, rhinorrhea, neck pain, neck stiffness and ear discharge.   Eyes: Negative for pain, discharge, redness, itching and visual disturbance.   Respiratory: Negative for cough, choking, chest tightness, shortness of breath, wheezing and stridor.   Cardiovascular: Negative for chest pain, palpitations and leg swelling.  Gastrointestinal: Negative for abdominal distention.  Genitourinary: Negative for dysuria, urgency, frequency, hematuria, flank pain, decreased urine volume, difficulty urinating and dyspareunia.  Musculoskeletal: Negative for back pain, joint swelling, arthralgias and gait problem.  Neurological: Negative for dizziness, tremors, seizures, syncope, facial asymmetry, speech difficulty, weakness, light-headedness, numbness and headaches.  Hematological: Negative for adenopathy. Does not bruise/bleed easily.  Psychiatric/Behavioral: Negative for hallucinations, behavioral problems, confusion, dysphoric mood, decreased concentration and agitation.    Objective:   Filed Vitals:   05/28/13 1117  BP: 204/79  Pulse: 69  Temp: 97 F (36.1 C)  Resp: 18    Physical Exam  Constitutional: Appears well-developed and well-nourished. No distress.  HENT: Normocephalic. External right and left ear normal. Oropharynx is clear and moist.  Eyes: Conjunctivae and EOM are normal. PERRLA, no scleral icterus.  Neck: Normal ROM. Neck supple. No JVD. No tracheal deviation. No thyromegaly.  CVS: RRR, S1/S2 +, no murmurs, no gallops, no carotid bruit.  Pulmonary: Effort and breath sounds normal, no stridor, rhonchi, wheezes, rales.  Abdominal: Soft. BS +,  no distension, tenderness, rebound or guarding.  Musculoskeletal: Normal range of motion. No edema  and no tenderness.  Lymphadenopathy: No lymphadenopathy noted, cervical, inguinal. Neuro: Alert. Normal reflexes, muscle tone coordination. No cranial nerve deficit. Skin: Skin is warm and dry. No rash noted. Not diaphoretic. No erythema. No pallor.  Psychiatric: Normal mood and affect. Behavior, judgment, thought content normal.   Lab Results  Component Value Date   WBC 4.1  12/17/2008   HGB 12.4 12/17/2008   HCT 36.4 12/17/2008   MCV 87.5 12/17/2008   PLT 230 12/17/2008   Lab Results  Component Value Date   CREATININE 1.16* 05/07/2013   BUN 19 05/07/2013   NA 144 05/07/2013   K 5.0 05/07/2013   CL 111 05/07/2013   CO2 23 05/07/2013    Lab Results  Component Value Date   HGBA1C 5.6 05/07/2013   Lipid Panel     Component Value Date/Time   CHOL 158 05/07/2013 1000   TRIG 48 05/07/2013 1000   HDL 62 05/07/2013 1000   CHOLHDL 2.5 05/07/2013 1000   VLDL 10 05/07/2013 1000   LDLCALC 86 05/07/2013 1000       Assessment and plan:   Patient Active Problem List   Diagnosis Date Noted  . Type II or unspecified type diabetes mellitus without mention of complication, not stated as uncontrolled 05/07/2013  . CKD (chronic kidney disease) 05/07/2013  . Unspecified essential hypertension 05/07/2013  . Smoker unmotivated to quit 05/07/2013   Sent new prescription for amlodipine 5 mg po daily and refills. Reviewed labs with patient today Continue diet plan with low sodium diet discussed with patient today.  Continue monitoring blood pressure closely at home.  strongly encouraged tobacco cessation and counseled extensively today.  RTC in 3 months  The patient was given clear instructions to go to ER or return to medical center if symptoms don't improve, worsen or new problems develop.  The patient verbalized understanding.  The patient was told to call to get any lab results if not heard anything in the next week.    Andrea Fee, MD, CDE, [place], [place]

## 2013-05-28 NOTE — Patient Instructions (Addendum)
DASH Diet The DASH diet stands for "Dietary Approaches to Stop Hypertension." It is a healthy eating plan that has been shown to reduce high blood pressure (hypertension) in as little as 14 days, while also possibly providing other significant health benefits. These other health benefits include reducing the risk of breast cancer after menopause and reducing the risk of type 2 diabetes, heart disease, colon cancer, and stroke. Health benefits also include weight loss and slowing kidney failure in patients with chronic kidney disease.  DIET GUIDELINES  Limit salt (sodium). Your diet should contain less than 1500 mg of sodium daily.  Limit refined or processed carbohydrates. Your diet should include mostly whole grains. Desserts and added sugars should be used sparingly.  Include small amounts of heart-healthy fats. These types of fats include nuts, oils, and tub margarine. Limit saturated and trans fats. These fats have been shown to be harmful in the body. CHOOSING FOODS  The following food groups are based on a 2000 calorie diet. See your Registered Dietitian for individual calorie needs. Grains and Grain Products (6 to 8 servings daily)  Eat More Often: Whole-wheat bread, brown rice, whole-grain or wheat pasta, quinoa, popcorn without added fat or salt (air popped).  Eat Less Often: White bread, white pasta, white rice, cornbread. Vegetables (4 to 5 servings daily)  Eat More Often: Fresh, frozen, and canned vegetables. Vegetables may be raw, steamed, roasted, or grilled with a minimal amount of fat.  Eat Less Often/Avoid: Creamed or fried vegetables. Vegetables in a cheese sauce. Fruit (4 to 5 servings daily)  Eat More Often: All fresh, canned (in natural juice), or frozen fruits. Dried fruits without added sugar. One hundred percent fruit juice ( cup [237 mL] daily).  Eat Less Often: Dried fruits with added sugar. Canned fruit in light or heavy syrup. [employer], Fish, and Poultry (2  servings or less daily. One serving is 3 to 4 oz [85-114 g]).  Eat More Often: Ninety percent or leaner ground beef, tenderloin, sirloin. Round cuts of beef, chicken breast, [place] breast. All fish. Grill, bake, or broil your meat. Nothing should be fried.  Eat Less Often/Avoid: Fatty cuts of meat, [place], or chicken leg, thigh, or wing. Fried cuts of meat or fish. Dairy (2 to 3 servings)  Eat More Often: Low-fat or fat-free milk, low-fat plain or light yogurt, reduced-fat or part-skim cheese.  Eat Less Often/Avoid: Milk (whole, 2%).Whole milk yogurt. Full-fat cheeses. Nuts, Seeds, and Legumes (4 to 5 servings per week)  Eat More Often: All without added salt.  Eat Less Often/Avoid: Salted nuts and seeds, canned beans with added salt. Fats and Sweets (limited)  Eat More Often: Vegetable oils, tub margarines without trans fats, sugar-free gelatin. Mayonnaise and salad dressings.  Eat Less Often/Avoid: Coconut oils, palm oils, butter, stick margarine, cream, half and half, cookies, candy, pie. FOR MORE INFORMATION The Dash Diet Eating Plan: www.dashdiet.org Document Released: 11/09/2011 Document Revised: 02/12/2012 Document Reviewed: 11/09/2011 [facility] Patient Information 2014 [place], [place]. Hypertension Hypertension is another name for high blood pressure. High blood pressure may mean that your heart needs to work harder to pump blood. Blood pressure consists of two numbers, which includes a higher number over a lower number (example: 110/72). HOME CARE   Make lifestyle changes as told by your doctor. This may include weight loss and exercise.  Take your blood pressure medicine every day.  Limit how much salt you use.  Stop smoking if you smoke.  Do not use drugs.  Talk  to your doctor if you are using decongestants or birth control pills. These medicines might make blood pressure higher.  Females should not drink more than 1 alcoholic drink per day. Males should not drink  more than 2 alcoholic drinks per day.  See your doctor as told. GET HELP RIGHT AWAY IF:   You have a blood pressure reading with a top number of 180 or higher.  You get a very bad headache.  You get blurred or changing vision.  You feel confused.  You feel weak, numb, or faint.  You get chest or belly (abdominal) pain.  You throw up (vomit).  You cannot breathe very well. MAKE SURE YOU:   Understand these instructions.  Will watch your condition.  Will get help right away if you are not doing well or get worse. Document Released: 05/08/2008 Document Revised: 02/12/2012 Document Reviewed: 05/08/2008 [facility] Patient Information 2014 [place], [place]. Hypoglycemia (Low Blood Sugar) Hypoglycemia is when the glucose (sugar) in your blood is too low. Hypoglycemia can happen for many reasons. It can happen to people with or without diabetes. Hypoglycemia can develop quickly and can be a medical emergency.  CAUSES  Having hypoglycemia does not mean that you will develop diabetes. Different causes include:  Missed or delayed meals or not enough carbohydrates eaten.  Medication overdose. This could be by accident or deliberate. If by accident, your medication may need to be adjusted or changed.  Exercise or increased activity without adjustments in carbohydrates or medications.  A nerve disorder that affects body functions like your heart rate, blood pressure and digestion (autonomic neuropathy).  A condition where the stomach muscles do not function properly (gastroparesis). Therefore, medications may not absorb properly.  The inability to recognize the signs of hypoglycemia (hypoglycemic unawareness).  Absorption of insulin  may be altered.  Alcohol consumption.  Pregnancy/menstrual cycles/postpartum. This may be due to hormones.  Certain kinds of tumors. This is very rare. SYMPTOMS   Sweating.  Hunger.  Dizziness.  Blurred  vision.  Drowsiness.  Weakness.  Headache.  Rapid heart beat.  Shakiness.  Nervousness. DIAGNOSIS  Diagnosis is made by monitoring blood glucose in one or all of the following ways:  Fingerstick blood glucose monitoring.  Laboratory results. TREATMENT  If you think your blood glucose is low:  Check your blood glucose, if possible. If it is less than 70 mg/dl, take one of the following:  3-4 glucose tablets.   cup juice (prefer clear like apple).   cup "regular" soda pop.  1 cup milk.  -1 tube of glucose gel.  5-6 hard candies.  Do not over treat because your blood glucose (sugar) will only go too high.  Wait 15 minutes and recheck your blood glucose. If it is still less than 70 mg/dl (or below your target range), repeat treatment.  Eat a snack if it is more than one hour until your next meal. Sometimes, your blood glucose may go so low that you are unable to treat yourself. You may need someone to help you. You may even pass out or be unable to swallow. This may require you to get an injection of glucagon, which raises the blood glucose. HOME CARE INSTRUCTIONS  Check blood glucose as recommended by your caregiver.  Take medication as prescribed by your caregiver.  Follow your meal plan. Do not skip meals. Eat on time.  If you are going to drink alcohol, drink it only with meals.  Check your blood glucose before driving.  Check  your blood glucose before and after exercise. If you exercise longer or different than usual, be sure to check blood glucose more frequently.  Always carry treatment with you. Glucose tablets are the easiest to carry.  Always wear medical alert jewelry or carry some form of identification that states that you have diabetes. This will alert people that you have diabetes. If you have hypoglycemia, they will have a better idea on what to do. SEEK MEDICAL CARE IF:   You are having problems keeping your blood sugar at target  range.  You are having frequent episodes of hypoglycemia.  You feel you might be having side effects from your medicines.  You have symptoms of an illness that is not improving after 3-4 days.  You notice a change in vision or a new problem with your vision. SEEK IMMEDIATE MEDICAL CARE IF:   You are a family member or friend of a person whose blood glucose goes below 70 mg/dl and is accompanied by:  Confusion.  A change in mental status.  The inability to swallow.  Passing out. Document Released: 11/20/2005 Document Revised: 02/12/2012 Document Reviewed: 03/18/2012 [facility] Patient Information 2014 [place], [place]. Hypertension As your heart beats, it forces blood through your arteries. This force is your blood pressure. If the pressure is too high, it is called hypertension (HTN) or high blood pressure. HTN is dangerous because you may have it and not know it. High blood pressure may mean that your heart has to work harder to pump blood. Your arteries may be narrow or stiff. The extra work puts you at risk for heart disease, stroke, and other problems.  Blood pressure consists of two numbers, a higher number over a lower, 110/72, for example. It is stated as "110 over 72." The ideal is below 120 for the top number (systolic) and under 80 for the bottom (diastolic). Write down your blood pressure today. You should pay close attention to your blood pressure if you have certain conditions such as:  Heart failure.  Prior heart attack.  Diabetes  Chronic kidney disease.  Prior stroke.  Multiple risk factors for heart disease. To see if you have HTN, your blood pressure should be measured while you are seated with your arm held at the level of the heart. It should be measured at least twice. A one-time elevated blood pressure reading (especially in the Emergency Department) does not mean that you need treatment. There may be conditions in which the blood pressure is different between  your right and left arms. It is important to see your caregiver soon for a recheck. Most people have essential hypertension which means that there is not a specific cause. This type of high blood pressure may be lowered by changing lifestyle factors such as:  Stress.  Smoking.  Lack of exercise.  Excessive weight.  Drug/tobacco/alcohol use.  Eating less salt. Most people do not have symptoms from high blood pressure until it has caused damage to the body. Effective treatment can often prevent, delay or reduce that damage. TREATMENT  When a cause has been identified, treatment for high blood pressure is directed at the cause. There are a large number of medications to treat HTN. These fall into several categories, and your caregiver will help you select the medicines that are best for you. Medications may have side effects. You should review side effects with your caregiver. If your blood pressure stays high after you have made lifestyle changes or started on medicines,   Your  medication(s) may need to be changed.  Other problems may need to be addressed.  Be certain you understand your prescriptions, and know how and when to take your medicine.  Be sure to follow up with your caregiver within the time frame advised (usually within two weeks) to have your blood pressure rechecked and to review your medications.  If you are taking more than one medicine to lower your blood pressure, make sure you know how and at what times they should be taken. Taking two medicines at the same time can result in blood pressure that is too low. SEEK IMMEDIATE MEDICAL CARE IF:  You develop a severe headache, blurred or changing vision, or confusion.  You have unusual weakness or numbness, or a faint feeling.  You have severe chest or abdominal pain, vomiting, or breathing problems. MAKE SURE YOU:   Understand these instructions.  Will watch your condition.  Will get help right away if you are not  doing well or get worse. Document Released: 11/20/2005 Document Revised: 02/12/2012 Document Reviewed: 07/10/2008 [facility] Patient Information 2014 [place].

## 2013-05-28 NOTE — Progress Notes (Signed)
Patient states she was prescribed amlodipine but was not allowed to get Rx due to no instructions on how to take medication by prescriber.

## 2013-08-07 ENCOUNTER — Encounter: Payer: Self-pay | Admitting: Internal Medicine

## 2013-08-07 ENCOUNTER — Ambulatory Visit: Payer: Medicare Other | Attending: Internal Medicine | Admitting: Internal Medicine

## 2013-08-07 VITALS — BP 163/77 | HR 75 | Temp 97.7°F | Resp 16 | Ht 61.0 in | Wt 149.0 lb

## 2013-08-07 DIAGNOSIS — I10 Essential (primary) hypertension: Secondary | ICD-10-CM

## 2013-08-07 DIAGNOSIS — N183 Chronic kidney disease, stage 3 (moderate): Secondary | ICD-10-CM

## 2013-08-07 DIAGNOSIS — N189 Chronic kidney disease, unspecified: Secondary | ICD-10-CM

## 2013-08-07 DIAGNOSIS — F17200 Nicotine dependence, unspecified, uncomplicated: Secondary | ICD-10-CM | POA: Insufficient documentation

## 2013-08-07 DIAGNOSIS — E119 Type 2 diabetes mellitus without complications: Secondary | ICD-10-CM | POA: Insufficient documentation

## 2013-08-07 DIAGNOSIS — E785 Hyperlipidemia, unspecified: Secondary | ICD-10-CM | POA: Insufficient documentation

## 2013-08-07 LAB — POCT GLYCOSYLATED HEMOGLOBIN (HGB A1C): Hemoglobin A1C: 6.5

## 2013-08-07 LAB — GLUCOSE, POCT (MANUAL RESULT ENTRY): POC Glucose: 60 mg/dl — AB (ref 70–99)

## 2013-08-07 MED ORDER — HYDROCHLOROTHIAZIDE 12.5 MG PO TABS: 12.5000 mg | ORAL_TABLET | Freq: Every day | ORAL | Status: DC

## 2013-08-07 MED ORDER — AMLODIPINE BESYLATE 10 MG PO TABS: 10.0000 mg | ORAL_TABLET | Freq: Every day | ORAL | Status: DC

## 2013-08-07 MED ORDER — ASPIRIN 81 MG PO TABS: 81.0000 mg | ORAL_TABLET | Freq: Every day | ORAL | Status: DC

## 2013-08-07 MED ORDER — PRAVASTATIN SODIUM 20 MG PO TABS: 20.0000 mg | ORAL_TABLET | Freq: Every day | ORAL | Status: DC

## 2013-08-07 MED ORDER — LISINOPRIL 40 MG PO TABS: 40.0000 mg | ORAL_TABLET | Freq: Every day | ORAL | Status: DC

## 2013-08-07 MED ORDER — GLIPIZIDE 5 MG PO TABS: 5.0000 mg | ORAL_TABLET | Freq: Every day | ORAL | Status: DC

## 2013-08-07 NOTE — Progress Notes (Signed)
Patient ID: Andrea Brown, female   DOB: [birth year], 62 y.o.   MRN: [id number] Patient Demographics  Andrea Brown, is a 62 y.o. female  [id number]  [id number]  DOB - 08-Apr-1951  Chief Complaint  Patient presents with  . Follow-up  . Hypertension  . Diabetes        Subjective:   Andrea Brown today is here for a follow up visit. Patient has No headache, No chest pain, No abdominal pain - No Nausea, No new weakness tingling or numbness, No Cough - SOB. Spot CBG check 60, patient denies any hypoglycemic symptoms. She states that she did not eat breakfast today. BP has been running a little bit high 163/77 today, denies any headache, dizziness, blurry vision. Declines flu shot. She states she never had one before and she will does not want it.  Objective:    Filed Vitals:   08/07/13 0938  BP: 163/77  Pulse: 75  Temp: 97.7 F (36.5 C)  TempSrc: Oral  Resp: 16  Height: 5\' 1"  (1.549 m)  Weight: 149 lb (67.586 kg)  SpO2: 97%     ALLERGIES:  No Known Allergies  PAST MEDICAL HISTORY: History reviewed. No pertinent past medical history.  MEDICATIONS AT HOME: Prior to Admission medications   Medication Sig Start Date End Date Taking? Authorizing Provider  amLODipine (NORVASC) 10 MG tablet Take 1 tablet (10 mg total) by mouth daily. 08/07/13  Yes Ripudeep Krystal Eaton, MD  aspirin 81 MG tablet Take 1 tablet (81 mg total) by mouth daily. 08/07/13  Yes Ripudeep Krystal Eaton, MD  glipiZIDE (GLUCOTROL) 5 MG tablet Take 1 tablet (5 mg total) by mouth daily with breakfast. 08/07/13  Yes Ripudeep K Rai, MD  hydrochlorothiazide (HYDRODIURIL) 12.5 MG tablet Take 1 tablet (12.5 mg total) by mouth daily. 08/07/13  Yes Ripudeep Krystal Eaton, MD  lisinopril (PRINIVIL,ZESTRIL) 40 MG tablet Take 1 tablet (40 mg total) by mouth daily. 08/07/13  Yes Ripudeep Krystal Eaton, MD  pravastatin (PRAVACHOL) 20 MG tablet Take 1 tablet (20 mg total) by mouth daily. 08/07/13  Yes Ripudeep Krystal Eaton, MD     Exam  General appearance :Awake,  alert, NAD, Speech Clear.  HEENT: Atraumatic and Normocephalic, PERLA Neck: supple, no JVD. No cervical lymphadenopathy.  Chest: Clear to auscultation bilaterally, no wheezing, rales or rhonchi CVS: S1 S2 regular, no murmurs.  Abdomen: soft, NBS, NT, ND, no gaurding, rigidity or rebound. Extremities: no cyanosis or clubbing, B/L Lower Ext shows no edema Neurology: Awake alert, and oriented X 3, CN II-XII intact, Non focal Skin: No Rash or lesions Wounds:N/A    Data Review   Basic Metabolic Panel: No results found for this basename: NA, K, CL, CO2, GLUCOSE, BUN, CREATININE, CALCIUM, MG, PHOS,  in the last 168 hours Liver Function Tests: No results found for this basename: AST, ALT, ALKPHOS, BILITOT, PROT, ALBUMIN,  in the last 168 hours  CBC: No results found for this basename: WBC, NEUTROABS, HGB, HCT, MCV, PLT,  in the last 168 hours  ------------------------------------------------------------------------------------------------------------------ No results found for this basename: HGBA1C,  in the last 72 hours ------------------------------------------------------------------------------------------------------------------ No results found for this basename: CHOL, HDL, LDLCALC, TRIG, CHOLHDL, LDLDIRECT,  in the last 72 hours ------------------------------------------------------------------------------------------------------------------ No results found for this basename: TSH, T4TOTAL, FREET3, T3FREE, THYROIDAB,  in the last 72 hours ------------------------------------------------------------------------------------------------------------------ No results found for this basename: VITAMINB12, FOLATE, FERRITIN, TIBC, IRON, RETICCTPCT,  in the last 72 hours  Coagulation profile  No results found for this basename: INR, PROTIME,  in the last 168 hours    Assessment & Plan   Active Problems: Hypertension: Somewhat elevated - Increase amlodipine to 10 mg daily, continue  lisinopril and HCTZ - Advised patient to continue diet control and exercise daily  Diabetes mellitus: Spot CBG 60, hemoglobin A1c 6.5% Will continue glipizide for now, advised patient to have strict carb modified diet, exercise daily  Hyperlipidemia- Continue pravastatin  Nicotine dependence: - Consultation on smoking cessation, she states that she is already trying to quit by herself  Health maintenance : - Flu shot was advised to the patient however she declined it (states that she never had one and does not want it  - need ophthalmology examination, ambulatory referral sent -  Will check labs at next visit, CBC, BMET, lipid panel   Recommendations: check labs at next visit, ambulatory referral, amlodipine increased to 10 mg  Follow-up in4 months.     RAI,RIPUDEEP M.D. 08/07/2013, 10:03 AM

## 2013-08-07 NOTE — Progress Notes (Signed)
PT HERE FOR 3 MNTH  F/U DIABETES AND HTN WITH MEDICATION MANAGEMENT CONTINUES TO SMOKE BUT STATES SHE HAS CUT BACK TO 1 PACK PER 3 DYS TAKING PRESCRIBED MEDS NEEDS A1C,CBG

## 2013-12-08 ENCOUNTER — Ambulatory Visit: Payer: Medicare Other | Attending: Internal Medicine | Admitting: Internal Medicine

## 2013-12-08 ENCOUNTER — Encounter: Payer: Self-pay | Admitting: Internal Medicine

## 2013-12-08 VITALS — BP 161/71 | HR 75 | Temp 97.5°F | Resp 14 | Ht 61.0 in | Wt 150.0 lb

## 2013-12-08 DIAGNOSIS — N644 Mastodynia: Secondary | ICD-10-CM

## 2013-12-08 DIAGNOSIS — I10 Essential (primary) hypertension: Secondary | ICD-10-CM | POA: Insufficient documentation

## 2013-12-08 DIAGNOSIS — E119 Type 2 diabetes mellitus without complications: Secondary | ICD-10-CM | POA: Insufficient documentation

## 2013-12-08 LAB — CBC WITH DIFFERENTIAL/PLATELET
BASOS ABS: 0 10*3/uL (ref 0.0–0.1)
BASOS PCT: 0 % (ref 0–1)
EOS PCT: 2 % (ref 0–5)
Eosinophils Absolute: 0.1 10*3/uL (ref 0.0–0.7)
HEMATOCRIT: 29.6 % — AB (ref 36.0–46.0)
Hemoglobin: 10 g/dL — ABNORMAL LOW (ref 12.0–15.0)
Lymphocytes Relative: 36 % (ref 12–46)
Lymphs Abs: 1.5 10*3/uL (ref 0.7–4.0)
MCH: 28.2 pg (ref 26.0–34.0)
MCHC: 33.8 g/dL (ref 30.0–36.0)
MCV: 83.4 fL (ref 78.0–100.0)
MONO ABS: 0.2 10*3/uL (ref 0.1–1.0)
Monocytes Relative: 5 % (ref 3–12)
Neutro Abs: 2.4 10*3/uL (ref 1.7–7.7)
Neutrophils Relative %: 57 % (ref 43–77)
PLATELETS: 205 10*3/uL (ref 150–400)
RBC: 3.55 MIL/uL — ABNORMAL LOW (ref 3.87–5.11)
RDW: 15.2 % (ref 11.5–15.5)
WBC: 4.3 10*3/uL (ref 4.0–10.5)

## 2013-12-08 LAB — COMPLETE METABOLIC PANEL WITH GFR
ALBUMIN: 4.3 g/dL (ref 3.5–5.2)
ALK PHOS: 60 U/L (ref 39–117)
ALT: 9 U/L (ref 0–35)
AST: 19 U/L (ref 0–37)
BUN: 39 mg/dL — ABNORMAL HIGH (ref 6–23)
CALCIUM: 9.1 mg/dL (ref 8.4–10.5)
CHLORIDE: 107 meq/L (ref 96–112)
CO2: 24 mEq/L (ref 19–32)
Creat: 1.36 mg/dL — ABNORMAL HIGH (ref 0.50–1.10)
GFR, EST NON AFRICAN AMERICAN: 41 mL/min — AB
GFR, Est African American: 47 mL/min — ABNORMAL LOW
GLUCOSE: 136 mg/dL — AB (ref 70–99)
POTASSIUM: 4.8 meq/L (ref 3.5–5.3)
SODIUM: 141 meq/L (ref 135–145)
TOTAL PROTEIN: 7.1 g/dL (ref 6.0–8.3)
Total Bilirubin: 0.4 mg/dL (ref 0.3–1.2)

## 2013-12-08 LAB — TSH: TSH: 1.529 u[IU]/mL (ref 0.350–4.500)

## 2013-12-08 LAB — GLUCOSE, POCT (MANUAL RESULT ENTRY): POC Glucose: 146 mg/dl — AB (ref 70–99)

## 2013-12-08 LAB — POCT GLYCOSYLATED HEMOGLOBIN (HGB A1C): Hemoglobin A1C: 6.8

## 2013-12-08 MED ORDER — GLIPIZIDE 5 MG PO TABS: 5.0000 mg | ORAL_TABLET | Freq: Every day | ORAL | Status: DC

## 2013-12-08 MED ORDER — AMLODIPINE BESYLATE 10 MG PO TABS: 10.0000 mg | ORAL_TABLET | Freq: Every day | ORAL | Status: DC

## 2013-12-08 MED ORDER — ASPIRIN 81 MG PO TABS: 81.0000 mg | ORAL_TABLET | Freq: Every day | ORAL | Status: DC

## 2013-12-08 MED ORDER — LISINOPRIL 40 MG PO TABS: 40.0000 mg | ORAL_TABLET | Freq: Every day | ORAL | Status: DC

## 2013-12-08 MED ORDER — HYDROCHLOROTHIAZIDE 12.5 MG PO TABS: 12.5000 mg | ORAL_TABLET | Freq: Every day | ORAL | Status: DC

## 2013-12-08 MED ORDER — PRAVASTATIN SODIUM 20 MG PO TABS: 20.0000 mg | ORAL_TABLET | Freq: Every day | ORAL | Status: DC

## 2013-12-08 NOTE — Progress Notes (Signed)
Pt is here for a f/u with a diabetes check up and BP monitoring. BP today is 161/71.

## 2013-12-08 NOTE — Progress Notes (Signed)
Patient ID: Andrea Brown, female   DOB: May 02, 1951, 63 y.o.   MRN: [id number]   CC:  HPI:  63 year old female here for followup of hypertension diabetes, blood pressure still not controlled and is elevated in the 160s. CBG of 140 today. A1c is pending She denies any other symptoms, she is requesting a refill on her medications   No Known Allergies No past medical history on file. Current Outpatient Prescriptions on File Prior to Visit  Medication Sig Dispense Refill  . [DISCONTINUED] atorvastatin (LIPITOR) 10 MG tablet Take 10 mg by mouth daily.       No current facility-administered medications on file prior to visit.   Family History  Problem Relation Age of Onset  . Diabetes Mother   . Hypertension Mother    History   Social History  . Marital Status: Single    Spouse Name: N/A    Number of Children: N/A  . Years of Education: N/A   Occupational History  . Not on file.   Social History Main Topics  . Smoking status: Light Tobacco Smoker  . Smokeless tobacco: Not on file  . Alcohol Use: No  . Drug Use: Not on file  . Sexual Activity: Not on file   Other Topics Concern  . Not on file   Social History Narrative  . No narrative on file    Review of Systems  Constitutional: Negative for fever, chills, diaphoresis, activity change, appetite change and fatigue.  HENT: Negative for ear pain, nosebleeds, congestion, facial swelling, rhinorrhea, neck pain, neck stiffness and ear discharge.   Eyes: Negative for pain, discharge, redness, itching and visual disturbance.  Respiratory: Negative for cough, choking, chest tightness, shortness of breath, wheezing and stridor.   Cardiovascular: Negative for chest pain, palpitations and leg swelling.  Gastrointestinal: Negative for abdominal distention.  Genitourinary: Negative for dysuria, urgency, frequency, hematuria, flank pain, decreased urine volume, difficulty urinating and dyspareunia.  Musculoskeletal: Negative for back  pain, joint swelling, arthralgias and gait problem.  Neurological: Negative for dizziness, tremors, seizures, syncope, facial asymmetry, speech difficulty, weakness, light-headedness, numbness and headaches.  Hematological: Negative for adenopathy. Does not bruise/bleed easily.  Psychiatric/Behavioral: Negative for hallucinations, behavioral problems, confusion, dysphoric mood, decreased concentration and agitation.    Objective:   Filed Vitals:   12/08/13 0915  BP: 161/71  Pulse: 75  Temp: 97.5 F (36.4 C)  Resp: 14    Physical Exam  Constitutional: Appears well-developed and well-nourished. No distress.  HENT: Normocephalic. External right and left ear normal. Oropharynx is clear and moist.  Eyes: Conjunctivae and EOM are normal. PERRLA, no scleral icterus.  Neck: Normal ROM. Neck supple. No JVD. No tracheal deviation. No thyromegaly.  CVS: RRR, S1/S2 +, no murmurs, no gallops, no carotid bruit.  Pulmonary: Effort and breath sounds normal, no stridor, rhonchi, wheezes, rales.  Abdominal: Soft. BS +,  no distension, tenderness, rebound or guarding.  Musculoskeletal: Normal range of motion. No edema and no tenderness.  Lymphadenopathy: No lymphadenopathy noted, cervical, inguinal. Neuro: Alert. Normal reflexes, muscle tone coordination. No cranial nerve deficit. Skin: Skin is warm and dry. No rash noted. Not diaphoretic. No erythema. No pallor.  Psychiatric: Normal mood and affect. Behavior, judgment, thought content normal.   Lab Results  Component Value Date   WBC 4.1 12/17/2008   HGB 12.4 12/17/2008   HCT 36.4 12/17/2008   MCV 87.5 12/17/2008   PLT 230 12/17/2008   Lab Results  Component Value Date   CREATININE 1.16* 05/07/2013  BUN 19 05/07/2013   NA 144 05/07/2013   K 5.0 05/07/2013   CL 111 05/07/2013   CO2 23 05/07/2013    Lab Results  Component Value Date   HGBA1C 6.5 08/07/2013   Lipid Panel     Component Value Date/Time   CHOL 158 05/07/2013 1000   TRIG 48 05/07/2013 1000    HDL 62 05/07/2013 1000   CHOLHDL 2.5 05/07/2013 1000   VLDL 10 05/07/2013 1000   LDLCALC 86 05/07/2013 1000       Assessment and plan:   Patient Active Problem List   Diagnosis Date Noted  . Nicotine dependence 08/07/2013  . Other and unspecified hyperlipidemia 08/07/2013  . Type II or unspecified type diabetes mellitus without mention of complication, not stated as uncontrolled 05/07/2013  . CKD (chronic kidney disease) 05/07/2013  . Unspecified essential hypertension 05/07/2013  . Smoker unmotivated to quit 05/07/2013        Diabetes Continue current regimen She states that her metformin was discontinued last year    Hypertension Blood pressure somewhat elevated however the changes in the medications are being made given the fact the patient has gained some weight and is motivated to lose weight before her next appointment  Establish care Refusing colonoscopy Refusing flu vaccination Mammogram scheduled Refusing colonoscopy   The patient was given clear instructions to go to ER or return to medical center if symptoms don't improve, worsen or new problems develop. The patient verbalized understanding. The patient was told to call to get any lab results if not heard anything in the next week.

## 2013-12-10 ENCOUNTER — Telehealth: Payer: Self-pay | Admitting: *Deleted

## 2013-12-10 NOTE — Telephone Encounter (Signed)
Scheduled appointment for pt to come in for a follow up appointment to complete a CBC and anemia panel for 01/12/2014 at 9:15am.

## 2013-12-10 NOTE — Telephone Encounter (Signed)
Message copied by Dene Nazir, [place] R on Wed Dec 10, 2013  3:40 PM ------      Message from: Allyson Sabal MD, [facility]      Created: Wed Dec 10, 2013  3:20 PM       Please notify patient kidney function is stable for last one year. Patient is mildly anemic. Patient would need repeat CBC, anemia panel in one month. please schedule this ------

## 2013-12-12 ENCOUNTER — Ambulatory Visit
Admission: RE | Admit: 2013-12-12 | Discharge: 2013-12-12 | Disposition: A | Discharge status: Home / Self Care | Payer: Medicare HMO | Source: Ambulatory Visit | Attending: Internal Medicine | Admitting: Internal Medicine

## 2013-12-12 ENCOUNTER — Other Ambulatory Visit: Payer: Self-pay | Admitting: Internal Medicine

## 2013-12-12 DIAGNOSIS — E119 Type 2 diabetes mellitus without complications: Secondary | ICD-10-CM

## 2013-12-12 DIAGNOSIS — I10 Essential (primary) hypertension: Secondary | ICD-10-CM

## 2013-12-12 DIAGNOSIS — N644 Mastodynia: Secondary | ICD-10-CM

## 2013-12-15 ENCOUNTER — Ambulatory Visit (INDEPENDENT_AMBULATORY_CARE_PROVIDER_SITE_OTHER): Payer: Medicare Other | Admitting: Home Health Services

## 2013-12-15 DIAGNOSIS — E119 Type 2 diabetes mellitus without complications: Secondary | ICD-10-CM

## 2013-12-15 NOTE — Progress Notes (Signed)
DIABETES Pt came in to have a retinal scan per diabetic care.   Image was taken and submitted to [facility]-DR. Cathren Laine for reading.    Results will be available in 1-2 weeks.  Results will be given to PCP for review and to contact patient.  Vinnie Level

## 2013-12-16 ENCOUNTER — Ambulatory Visit: Payer: Medicare Other | Admitting: Home Health Services

## 2013-12-23 ENCOUNTER — Telehealth: Payer: Self-pay | Admitting: *Deleted

## 2013-12-23 NOTE — Telephone Encounter (Signed)
Message copied by Lamonta Cypress, [place] R on Tue Dec 23, 2013  1:06 PM ------      Message from: Allyson Sabal MD, [facility]      Created: Mon Dec 22, 2013 10:54 AM       Mammogram normal, repeat mammogram in one year ------

## 2014-01-12 ENCOUNTER — Ambulatory Visit: Payer: Medicare Other | Attending: Internal Medicine

## 2014-01-12 DIAGNOSIS — R7989 Other specified abnormal findings of blood chemistry: Secondary | ICD-10-CM

## 2014-01-12 LAB — CBC WITH DIFFERENTIAL/PLATELET
BASOS ABS: 0 10*3/uL (ref 0.0–0.1)
BASOS PCT: 0 % (ref 0–1)
Eosinophils Absolute: 0.1 10*3/uL (ref 0.0–0.7)
Eosinophils Relative: 2 % (ref 0–5)
HCT: 30.3 % — ABNORMAL LOW (ref 36.0–46.0)
Hemoglobin: 10.1 g/dL — ABNORMAL LOW (ref 12.0–15.0)
Lymphocytes Relative: 42 % (ref 12–46)
Lymphs Abs: 2.1 10*3/uL (ref 0.7–4.0)
MCH: 27.6 pg (ref 26.0–34.0)
MCHC: 33.3 g/dL (ref 30.0–36.0)
MCV: 82.8 fL (ref 78.0–100.0)
MONOS PCT: 6 % (ref 3–12)
Monocytes Absolute: 0.3 10*3/uL (ref 0.1–1.0)
NEUTROS ABS: 2.5 10*3/uL (ref 1.7–7.7)
NEUTROS PCT: 50 % (ref 43–77)
Platelets: 210 10*3/uL (ref 150–400)
RBC: 3.66 MIL/uL — ABNORMAL LOW (ref 3.87–5.11)
RDW: 15.3 % (ref 11.5–15.5)
WBC: 5.1 10*3/uL (ref 4.0–10.5)

## 2014-01-12 LAB — ANEMIA PANEL
%SAT: 21 % (ref 20–55)
ABS RETIC: 43.9 10*3/uL (ref 19.0–186.0)
FERRITIN: 32 ng/mL (ref 10–291)
Folate: 14.2 ng/mL
Iron: 70 ug/dL (ref 42–145)
RBC.: 3.66 MIL/uL — ABNORMAL LOW (ref 3.87–5.11)
RETIC CT PCT: 1.2 % (ref 0.4–2.3)
TIBC: 326 ug/dL (ref 250–470)
UIBC: 256 ug/dL (ref 125–400)
Vitamin B-12: 296 pg/mL (ref 211–911)

## 2014-01-16 ENCOUNTER — Telehealth: Payer: Self-pay | Admitting: Emergency Medicine

## 2014-01-16 NOTE — Telephone Encounter (Signed)
Message copied by Ricci Barker on Fri Jan 16, 2014  3:59 PM ------      Message from: Tresa Garter      Created: Fri Jan 16, 2014 12:08 PM       Please inform patient that her hemoglobin level is still low at 10.1 but stable from what it was one month ago ------

## 2014-01-16 NOTE — Telephone Encounter (Signed)
Pt given lab results. Verbalized understanding

## 2014-03-09 ENCOUNTER — Ambulatory Visit: Payer: Medicare HMO | Attending: Internal Medicine | Admitting: Internal Medicine

## 2014-03-09 ENCOUNTER — Encounter: Payer: Self-pay | Admitting: Internal Medicine

## 2014-03-09 VITALS — BP 159/78 | HR 74 | Temp 98.0°F | Resp 16 | Ht 61.0 in | Wt 152.0 lb

## 2014-03-09 DIAGNOSIS — F17200 Nicotine dependence, unspecified, uncomplicated: Secondary | ICD-10-CM

## 2014-03-09 DIAGNOSIS — Z09 Encounter for follow-up examination after completed treatment for conditions other than malignant neoplasm: Secondary | ICD-10-CM | POA: Insufficient documentation

## 2014-03-09 DIAGNOSIS — I10 Essential (primary) hypertension: Secondary | ICD-10-CM

## 2014-03-09 DIAGNOSIS — N189 Chronic kidney disease, unspecified: Secondary | ICD-10-CM | POA: Insufficient documentation

## 2014-03-09 DIAGNOSIS — I129 Hypertensive chronic kidney disease with stage 1 through stage 4 chronic kidney disease, or unspecified chronic kidney disease: Secondary | ICD-10-CM | POA: Insufficient documentation

## 2014-03-09 DIAGNOSIS — E119 Type 2 diabetes mellitus without complications: Secondary | ICD-10-CM | POA: Insufficient documentation

## 2014-03-09 LAB — POCT GLYCOSYLATED HEMOGLOBIN (HGB A1C): HEMOGLOBIN A1C: 7.3

## 2014-03-09 LAB — GLUCOSE, POCT (MANUAL RESULT ENTRY): POC Glucose: 151 mg/dl — AB (ref 70–99)

## 2014-03-09 MED ORDER — GLIPIZIDE 5 MG PO TABS: 5.0000 mg | ORAL_TABLET | Freq: Two times a day (BID) | ORAL | Status: DC

## 2014-03-09 NOTE — Progress Notes (Signed)
Pt is here following up on her diabetes and HTN.  

## 2014-03-09 NOTE — Progress Notes (Signed)
Patient ID: Andrea Brown, female   DOB: 15-May-1951, 63 y.o.   MRN: [id number]   Andrea Brown, is a 63 y.o. female  [id number]  [id number]  DOB - Apr 13, 1951  Chief Complaint  Patient presents with  . Follow-up        Subjective:   Andrea Brown is a 63 y.o. female here today for a follow up visit. Patient is here for medication refills and for routine followup of diabetes and hypertension. She has no complaint today. She has not be compliant with medication especially the antihypertensives, she continues to smoke heavily about one pack of cigarettes per day and she's not ready to quit. She reports no side effects to medication, she has good urinary output, no change in bowel habit. She had her mammogram and ophthalmologic examination in January of 2015, she also had her Pap smear up to date. Patient has No headache, No chest pain, No abdominal pain - No Nausea, No new weakness tingling or numbness, No Cough - SOB.  Problem  Diabetes  Essential Hypertension, Benign    ALLERGIES: No Known Allergies  PAST MEDICAL HISTORY: History reviewed. No pertinent past medical history.  MEDICATIONS AT HOME: Prior to Admission medications   Medication Sig Start Date End Date Taking? Authorizing Provider  amLODipine (NORVASC) 10 MG tablet Take 1 tablet (10 mg total) by mouth daily. 12/08/13  Yes Reyne Dumas, MD  aspirin 81 MG tablet Take 1 tablet (81 mg total) by mouth daily. 12/08/13  Yes Reyne Dumas, MD  glipiZIDE (GLUCOTROL) 5 MG tablet Take 1 tablet (5 mg total) by mouth 2 (two) times daily before a meal. 03/09/14  Yes Angelica Chessman, MD  hydrochlorothiazide (HYDRODIURIL) 12.5 MG tablet Take 1 tablet (12.5 mg total) by mouth daily. 12/08/13  Yes Reyne Dumas, MD  lisinopril (PRINIVIL,ZESTRIL) 40 MG tablet Take 1 tablet (40 mg total) by mouth daily. 12/08/13  Yes Reyne Dumas, MD  simvastatin (ZOCOR) 10 MG tablet Take 10 mg by mouth daily.   Yes Historical Provider, MD  pravastatin (PRAVACHOL) 20 MG  tablet Take 1 tablet (20 mg total) by mouth daily. 12/08/13   Reyne Dumas, MD     Objective:   Filed Vitals:   03/09/14 0903  BP: 159/78  Pulse: 74  Temp: 98 F (36.7 C)  TempSrc: Oral  Resp: 16  Height: 5\' 1"  (1.549 m)  Weight: 152 lb (68.947 kg)  SpO2: 97%    Exam General appearance : Awake, alert, not in any distress. Speech Clear. Not toxic looking HEENT: Atraumatic and Normocephalic, pupils equally reactive to light and accomodation Neck: supple, no JVD. No cervical lymphadenopathy.  Chest:Good air entry bilaterally, no added sounds  CVS: S1 S2 regular, no murmurs.  Abdomen: Bowel sounds present, Non tender and not distended with no gaurding, rigidity or rebound. Extremities: B/L Lower Ext shows no edema, both legs are warm to touch Neurology: Awake alert, and oriented X 3, CN II-XII intact, Non focal Skin:No Rash Wounds:N/A  Data Review Lab Results  Component Value Date   HGBA1C 7.3 03/09/2014   HGBA1C 6.8 12/08/2013   HGBA1C 6.5 08/07/2013     Assessment & Plan   1. Diabetes  - Glucose (CBG) - HgB A1c has gone up to 7.3% from 6.8% 3 months ago  2. Essential hypertension, benign Patient has not taken her medications today Patient will continue amlodipine 10 mg tablet by mouth daily and hydrochlorothiazide 12.5 mg tablet by mouth daily with lisinopril 40 mg tablet by mouth daily,  patient was counseled extensively on compliance with medications She has been counseled also about nutrition specifically on DASH diet  3. Nicotine dependence Patient was again counseled extensively on smoking cessation, she said she's not ready to quit  4. CKD (chronic kidney disease) We'll repeat - COMPLETE METABOLIC PANEL WITH GFR - Lipid panel  5. Type II or unspecified type diabetes mellitus without mention of complication, not stated as uncontrolled Blood sugar is uncontrolled, with increased glipizide to 5 mg tablet by mouth twice a day - glipiZIDE (GLUCOTROL) 5 MG tablet;  Take 1 tablet (5 mg total) by mouth 2 (two) times daily before a meal.  Dispense: 180 tablet; Refill: 3    Return in about 3 months (around 06/08/2014), or if symptoms worsen or fail to improve, for Hemoglobin A1C and Follow up, DM, Follow up HTN.  The patient was given clear instructions to go to ER or return to medical center if symptoms don't improve, worsen or new problems develop. The patient verbalized understanding. The patient was told to call to get lab results if they haven't heard anything in the next week.   This note has been created with Surveyor, quantity. Any transcriptional errors are unintentional.    Angelica Chessman, MD, [place], [place], [place] and [place], [place]   03/09/2014, 9:45 AM

## 2014-06-03 ENCOUNTER — Other Ambulatory Visit: Payer: Self-pay | Admitting: Internal Medicine

## 2014-06-08 ENCOUNTER — Ambulatory Visit: Payer: Medicare HMO | Attending: Internal Medicine | Admitting: Internal Medicine

## 2014-06-08 ENCOUNTER — Encounter: Payer: Self-pay | Admitting: Internal Medicine

## 2014-06-08 VITALS — BP 133/75 | HR 75 | Temp 98.6°F | Resp 16 | Ht 61.0 in | Wt 154.0 lb

## 2014-06-08 DIAGNOSIS — I10 Essential (primary) hypertension: Secondary | ICD-10-CM

## 2014-06-08 DIAGNOSIS — Z1211 Encounter for screening for malignant neoplasm of colon: Secondary | ICD-10-CM | POA: Diagnosis not present

## 2014-06-08 DIAGNOSIS — I129 Hypertensive chronic kidney disease with stage 1 through stage 4 chronic kidney disease, or unspecified chronic kidney disease: Secondary | ICD-10-CM | POA: Diagnosis not present

## 2014-06-08 DIAGNOSIS — E785 Hyperlipidemia, unspecified: Secondary | ICD-10-CM | POA: Insufficient documentation

## 2014-06-08 DIAGNOSIS — N183 Chronic kidney disease, stage 3 unspecified: Secondary | ICD-10-CM | POA: Insufficient documentation

## 2014-06-08 DIAGNOSIS — E119 Type 2 diabetes mellitus without complications: Secondary | ICD-10-CM | POA: Diagnosis not present

## 2014-06-08 LAB — GLUCOSE, POCT (MANUAL RESULT ENTRY): POC GLUCOSE: 59 mg/dL — AB (ref 70–99)

## 2014-06-08 LAB — POCT GLYCOSYLATED HEMOGLOBIN (HGB A1C): Hemoglobin A1C: 6.6

## 2014-06-08 MED ORDER — AMLODIPINE BESYLATE 10 MG PO TABS: 10.0000 mg | ORAL_TABLET | Freq: Every day | ORAL | Status: DC

## 2014-06-08 MED ORDER — SIMVASTATIN 10 MG PO TABS: 10.0000 mg | ORAL_TABLET | Freq: Every day | ORAL | Status: DC

## 2014-06-08 MED ORDER — HYDROCHLOROTHIAZIDE 12.5 MG PO TABS: 12.5000 mg | ORAL_TABLET | Freq: Every day | ORAL | Status: DC

## 2014-06-08 MED ORDER — GLIPIZIDE 5 MG PO TABS: 5.0000 mg | ORAL_TABLET | Freq: Two times a day (BID) | ORAL | Status: DC

## 2014-06-08 MED ORDER — LISINOPRIL 40 MG PO TABS: 40.0000 mg | ORAL_TABLET | Freq: Every day | ORAL | Status: DC

## 2014-06-08 NOTE — Patient Instructions (Signed)
DASH Eating Plan DASH stands for "Dietary Approaches to Stop Hypertension." The DASH eating plan is a healthy eating plan that has been shown to reduce high blood pressure (hypertension). Additional health benefits may include reducing the risk of type 2 diabetes mellitus, heart disease, and stroke. The DASH eating plan may also help with weight loss. WHAT DO I NEED TO KNOW ABOUT THE DASH EATING PLAN? For the DASH eating plan, you will follow these general guidelines:  Choose foods with a percent daily value for sodium of less than 5% (as listed on the food label).  Use salt-free seasonings or herbs instead of table salt or sea salt.  Check with your health care provider or pharmacist before using salt substitutes.  Eat lower-sodium products, often labeled as "lower sodium" or "no salt added."  Eat fresh foods.  Eat more vegetables, fruits, and low-fat dairy products.  Choose whole grains. Look for the word "whole" as the first word in the ingredient list.  Choose fish and skinless chicken or turkey more often than red meat. Limit fish, poultry, and meat to 6 oz (170 g) each day.  Limit sweets, desserts, sugars, and sugary drinks.  Choose heart-healthy fats.  Limit cheese to 1 oz (28 g) per day.  Eat more home-cooked food and less restaurant, buffet, and fast food.  Limit fried foods.  Cook foods using methods other than frying.  Limit canned vegetables. If you do use them, rinse them well to decrease the sodium.  When eating at a restaurant, ask that your food be prepared with less salt, or no salt if possible. WHAT FOODS CAN I EAT? Seek help from a dietitian for individual calorie needs. Grains Whole grain or whole wheat bread. Brown rice. Whole grain or whole wheat pasta. Quinoa, bulgur, and whole grain cereals. Low-sodium cereals. Corn or whole wheat flour tortillas. Whole grain cornbread. Whole grain crackers. Low-sodium crackers. Vegetables Fresh or frozen vegetables  (raw, steamed, roasted, or grilled). Low-sodium or reduced-sodium tomato and vegetable juices. Low-sodium or reduced-sodium tomato sauce and paste. Low-sodium or reduced-sodium canned vegetables.  Fruits All fresh, canned (in natural juice), or frozen fruits. Meat and Other Protein Products Ground beef (85% or leaner), grass-fed beef, or beef trimmed of fat. Skinless chicken or turkey. Ground chicken or turkey. Pork trimmed of fat. All fish and seafood. Eggs. Dried beans, peas, or lentils. Unsalted nuts and seeds. Unsalted canned beans. Dairy Low-fat dairy products, such as skim or 1% milk, 2% or reduced-fat cheeses, low-fat ricotta or cottage cheese, or plain low-fat yogurt. Low-sodium or reduced-sodium cheeses. Fats and Oils Tub margarines without trans fats. Light or reduced-fat mayonnaise and salad dressings (reduced sodium). Avocado. Safflower, olive, or canola oils. Natural peanut or almond butter. Other Unsalted popcorn and pretzels. The items listed above may not be a complete list of recommended foods or beverages. Contact your dietitian for more options. WHAT FOODS ARE NOT RECOMMENDED? Grains White bread. White pasta. White rice. Refined cornbread. Bagels and croissants. Crackers that contain trans fat. Vegetables Creamed or fried vegetables. Vegetables in a cheese sauce. Regular canned vegetables. Regular canned tomato sauce and paste. Regular tomato and vegetable juices. Fruits Dried fruits. Canned fruit in light or heavy syrup. Fruit juice. Meat and Other Protein Products Fatty cuts of meat. Ribs, chicken wings, bacon, sausage, bologna, salami, chitterlings, fatback, hot dogs, bratwurst, and packaged luncheon meats. Salted nuts and seeds. Canned beans with salt. Dairy Whole or 2% milk, cream, half-and-half, and cream cheese. Whole-fat or sweetened yogurt. Full-fat   cheeses or blue cheese. Nondairy creamers and whipped toppings. Processed cheese, cheese spreads, or cheese  curds. Condiments Onion and garlic salt, seasoned salt, table salt, and sea salt. Canned and packaged gravies. Worcestershire sauce. Tartar sauce. Barbecue sauce. Teriyaki sauce. Soy sauce, including reduced sodium. Steak sauce. Fish sauce. Oyster sauce. Cocktail sauce. Horseradish. Ketchup and mustard. Meat flavorings and tenderizers. Bouillon cubes. Hot sauce. Tabasco sauce. Marinades. Taco seasonings. Relishes. Fats and Oils Butter, stick margarine, lard, shortening, ghee, and bacon fat. Coconut, palm kernel, or palm oils. Regular salad dressings. Other Pickles and olives. Salted popcorn and pretzels. The items listed above may not be a complete list of foods and beverages to avoid. Contact your dietitian for more information. WHERE CAN I FIND MORE INFORMATION? National Heart, Lung, and Blood Institute: travelstabloid.com Document Released: 11/09/2011 Document Revised: 11/25/2013 Document Reviewed: 09/24/2013 [facility] Patient Information 2015 [place], [place]. This information is not intended to replace advice given to you by your health care provider. Make sure you discuss any questions you have with your health care provider. Hypertension Hypertension, commonly called high blood pressure, is when the force of blood pumping through your arteries is too strong. Your arteries are the blood vessels that carry blood from your heart throughout your body. A blood pressure reading consists of a higher number over a lower number, such as 110/72. The higher number (systolic) is the pressure inside your arteries when your heart pumps. The lower number (diastolic) is the pressure inside your arteries when your heart relaxes. Ideally you want your blood pressure below 120/80. Hypertension forces your heart to work harder to pump blood. Your arteries may become narrow or stiff. Having hypertension puts you at risk for heart disease, stroke, and other problems.  RISK  FACTORS Some risk factors for high blood pressure are controllable. Others are not.  Risk factors you cannot control include:   Race. You may be at higher risk if you are African American.  Age. Risk increases with age.  Gender. Men are at higher risk than women before age 46 years. After age 78, women are at higher risk than men. Risk factors you can control include:  Not getting enough exercise or physical activity.  Being overweight.  Getting too much fat, sugar, calories, or salt in your diet.  Drinking too much alcohol. SIGNS AND SYMPTOMS Hypertension does not usually cause signs or symptoms. Extremely high blood pressure (hypertensive crisis) may cause headache, anxiety, shortness of breath, and nosebleed. DIAGNOSIS  To check if you have hypertension, your health care provider will measure your blood pressure while you are seated, with your arm held at the level of your heart. It should be measured at least twice using the same arm. Certain conditions can cause a difference in blood pressure between your right and left arms. A blood pressure reading that is higher than normal on one occasion does not mean that you need treatment. If one blood pressure reading is high, ask your health care provider about having it checked again. TREATMENT  Treating high blood pressure includes making lifestyle changes and possibly taking medication. Living a healthy lifestyle can help lower high blood pressure. You may need to change some of your habits. Lifestyle changes may include:  Following the DASH diet. This diet is high in fruits, vegetables, and whole grains. It is low in salt, red meat, and added sugars.  Getting at least 2 1/2 hours of brisk physical activity every week.  Losing weight if necessary.  Not smoking.  Limiting alcoholic beverages.  Learning ways to reduce stress. If lifestyle changes are not enough to get your blood pressure under control, your health care provider  may prescribe medicine. You may need to take more than one. Work closely with your health care provider to understand the risks and benefits. HOME CARE INSTRUCTIONS  Have your blood pressure rechecked as directed by your health care provider.   Only take medicine as directed by your health care provider. Follow the directions carefully. Blood pressure medicines must be taken as prescribed. The medicine does not work as well when you skip doses. Skipping doses also puts you at risk for problems.   Do not smoke.   Monitor your blood pressure at home as directed by your health care provider. SEEK MEDICAL CARE IF:   You think you are having a reaction to medicines taken.  You have recurrent headaches or feel dizzy.  You have swelling in your ankles.  You have trouble with your vision. SEEK IMMEDIATE MEDICAL CARE IF:  You develop a severe headache or confusion.  You have unusual weakness, numbness, or feel faint.  You have severe chest or abdominal pain.  You vomit repeatedly.  You have trouble breathing. MAKE SURE YOU:   Understand these instructions.  Will watch your condition.  Will get help right away if you are not doing well or get worse. Document Released: 11/20/2005 Document Revised: 11/25/2013 Document Reviewed: 09/12/2013 [facility] Patient Information 2015 [place], [place]. This information is not intended to replace advice given to you by your health care provider. Make sure you discuss any questions you have with your health care provider. Diabetes and Exercise Exercising regularly is important. It is not just about losing weight. It has many health benefits, such as:  Improving your overall fitness, flexibility, and endurance.  Increasing your bone density.  Helping with weight control.  Decreasing your body fat.  Increasing your muscle strength.  Reducing stress and tension.  Improving your overall health. People with diabetes who exercise gain  additional benefits because exercise:  Reduces appetite.  Improves the body's use of blood sugar (glucose).  Helps lower or control blood glucose.  Decreases blood pressure.  Helps control blood lipids (such as cholesterol and triglycerides).  Improves the body's use of the hormone insulin by:  Increasing the body's insulin sensitivity.  Reducing the body's insulin needs.  Decreases the risk for heart disease because exercising:  Lowers cholesterol and triglycerides levels.  Increases the levels of good cholesterol (such as high-density lipoproteins [HDL]) in the body.  Lowers blood glucose levels. YOUR ACTIVITY PLAN  Choose an activity that you enjoy and set realistic goals. Your health care provider or diabetes educator can help you make an activity plan that works for you. You can break activities into 2 or 3 sessions throughout the day. Doing so is as good as one long session. Exercise ideas include:  Taking the dog for a walk.  Taking the stairs instead of the elevator.  Dancing to your favorite song.  Doing your favorite exercise with a friend. RECOMMENDATIONS FOR EXERCISING WITH TYPE 1 OR TYPE 2 DIABETES   Check your blood glucose before exercising. If blood glucose levels are greater than 240 mg/dL, check for urine ketones. Do not exercise if ketones are present.  Avoid injecting insulin into areas of the body that are going to be exercised. For example, avoid injecting insulin into:  The arms when playing tennis.  The legs when jogging.  Keep a record of:  Food intake before and after you exercise.  Expected peak times of insulin action.  Blood glucose levels before and after you exercise.  The type and amount of exercise you have done.  Review your records with your health care provider. Your health care provider will help you to develop guidelines for adjusting food intake and insulin amounts before and after exercising.  If you take insulin or oral  hypoglycemic agents, watch for signs and symptoms of hypoglycemia. They include:  Dizziness.  Shaking.  Sweating.  Chills.  Confusion.  Drink plenty of water while you exercise to prevent dehydration or heat stroke. Body water is lost during exercise and must be replaced.  Talk to your health care provider before starting an exercise program to make sure it is safe for you. Remember, almost any type of activity is better than none. Document Released: 02/10/2004 Document Revised: 07/23/2013 Document Reviewed: 04/29/2013 [facility] Patient Information 2015 [place], [place]. This information is not intended to replace advice given to you by your health care provider. Make sure you discuss any questions you have with your health care provider. Diabetes Mellitus and Food It is important for you to manage your blood sugar (glucose) level. Your blood glucose level can be greatly affected by what you eat. Eating healthier foods in the appropriate amounts throughout the day at about the same time each day will help you control your blood glucose level. It can also help slow or prevent worsening of your diabetes mellitus. Healthy eating may even help you improve the level of your blood pressure and reach or maintain a healthy weight.  HOW CAN FOOD AFFECT ME? Carbohydrates Carbohydrates affect your blood glucose level more than any other type of food. Your dietitian will help you determine how many carbohydrates to eat at each meal and teach you how to count carbohydrates. Counting carbohydrates is important to keep your blood glucose at a healthy level, especially if you are using insulin or taking certain medicines for diabetes mellitus. Alcohol Alcohol can cause sudden decreases in blood glucose (hypoglycemia), especially if you use insulin or take certain medicines for diabetes mellitus. Hypoglycemia can be a life-threatening condition. Symptoms of hypoglycemia (sleepiness, dizziness, and  disorientation) are similar to symptoms of having too much alcohol.  If your health care provider has given you approval to drink alcohol, do so in moderation and use the following guidelines:  Women should not have more than one drink per day, and men should not have more than two drinks per day. One drink is equal to:  12 oz of beer.  5 oz of wine.  1 oz of hard liquor.  Do not drink on an empty stomach.  Keep yourself hydrated. Have water, diet soda, or unsweetened iced tea.  Regular soda, juice, and other mixers might contain a lot of carbohydrates and should be counted. WHAT FOODS ARE NOT RECOMMENDED? As you make food choices, it is important to remember that all foods are not the same. Some foods have fewer nutrients per serving than other foods, even though they might have the same number of calories or carbohydrates. It is difficult to get your body what it needs when you eat foods with fewer nutrients. Examples of foods that you should avoid that are high in calories and carbohydrates but low in nutrients include:  Trans fats (most processed foods list trans fats on the Nutrition Facts label).  Regular soda.  Juice.  Candy.  Sweets, such as cake, pie, doughnuts, and cookies.  Fried foods. WHAT FOODS CAN I EAT? Have nutrient-rich foods, which will nourish your body and keep you healthy. The food you should eat also will depend on several factors, including:  The calories you need.  The medicines you take.  Your weight.  Your blood glucose level.  Your blood pressure level.  Your cholesterol level. You also should eat a variety of foods, including:  Protein, such as meat, poultry, fish, tofu, nuts, and seeds (lean animal proteins are best).  Fruits.  Vegetables.  Dairy products, such as milk, cheese, and yogurt (low fat is best).  Breads, grains, pasta, cereal, rice, and beans.  Fats such as olive oil, trans fat-free margarine, canola oil, avocado, and  olives. DOES EVERYONE WITH DIABETES MELLITUS HAVE THE SAME MEAL PLAN? Because every person with diabetes mellitus is different, there is not one meal plan that works for everyone. It is very important that you meet with a dietitian who will help you create a meal plan that is just right for you. Document Released: 08/17/2005 Document Revised: 11/25/2013 Document Reviewed: 10/17/2013 [facility] Patient Information 2015 [place], [place]. This information is not intended to replace advice given to you by your health care provider. Make sure you discuss any questions you have with your health care provider.

## 2014-06-08 NOTE — Progress Notes (Signed)
Patient ID: Andrea Brown, female   DOB: Apr 09, 1951, 63 y.o.   MRN: [id number]   Andrea Brown, is a 63 y.o. female  [id number]  [id number]  DOB - [birth year]  Chief Complaint  Patient presents with  . Follow-up        Subjective:   Andrea Brown is a 63 y.o. female here today for a follow up visit. Pleasant woman known to have high blood pressure, diabetes, and dyslipidemia. She is here today for refill of her medications on her regular followup. She has no complaints. She has not had colonoscopy done, she is scared of the procedure because no one has actually explained to her what is involved. She continues to smoke cigarette about half a pack per day and she's not ready to quit because she said that is the only habit she has, she does not drink alcohol, she does not do drugs. Patient has No headache, No chest pain, No abdominal pain - No Nausea, No new weakness tingling or numbness, No Cough - SOB.  Problem  Dyslipidemia    ALLERGIES: No Known Allergies  PAST MEDICAL HISTORY: History reviewed. No pertinent past medical history.  MEDICATIONS AT HOME: Prior to Admission medications   Medication Sig Start Date End Date Taking? Authorizing Provider  amLODipine (NORVASC) 10 MG tablet Take 1 tablet (10 mg total) by mouth daily. 06/08/14  Yes Angelica Chessman, MD  aspirin 81 MG tablet Take 1 tablet (81 mg total) by mouth daily. 12/08/13  Yes Reyne Dumas, MD  glipiZIDE (GLUCOTROL) 5 MG tablet Take 1 tablet (5 mg total) by mouth 2 (two) times daily before a meal. 06/08/14  Yes Angelica Chessman, MD  hydrochlorothiazide (HYDRODIURIL) 12.5 MG tablet Take 1 tablet (12.5 mg total) by mouth daily. 06/08/14  Yes Angelica Chessman, MD  lisinopril (PRINIVIL,ZESTRIL) 40 MG tablet Take 1 tablet (40 mg total) by mouth daily. 06/08/14  Yes Angelica Chessman, MD  pravastatin (PRAVACHOL) 20 MG tablet Take 1 tablet (20 mg total) by mouth daily. 12/08/13  Yes Reyne Dumas, MD  simvastatin (ZOCOR) 10 MG tablet Take  1 tablet (10 mg total) by mouth daily at 6 PM. 06/08/14  Yes Angelica Chessman, MD     Objective:   Filed Vitals:   06/08/14 0908  BP: 133/75  Pulse: 75  Temp: 98.6 F (37 C)  TempSrc: Oral  Resp: 16  Height: 5\' 1"  (1.549 m)  Weight: 154 lb (69.854 kg)  SpO2: 100%    Exam General appearance : Awake, alert, not in any distress. Speech Clear. Not toxic looking, poor dentition HEENT: Atraumatic and Normocephalic, pupils equally reactive to light and accomodation Neck: supple, no JVD. No cervical lymphadenopathy.  Chest:Good air entry bilaterally, no added sounds  CVS: S1 S2 regular, no murmurs.  Abdomen: Bowel sounds present, Non tender and not distended with no gaurding, rigidity or rebound. Extremities: B/L Lower Ext shows no edema, both legs are warm to touch Neurology: Awake alert, and oriented X 3, CN II-XII intact, Non focal Skin:No Rash Wounds:N/A  Data Review Lab Results  Component Value Date   HGBA1C 6.6 06/08/2014   HGBA1C 7.3 03/09/2014   HGBA1C 6.8 12/08/2013     Assessment & Plan   1. Type 2 diabetes mellitus without complication  - Glucose (CBG) was 59, patient is fasting - HgB A1c is 6.6% today better than several 7.3% 3 months ago  2. Essential hypertension, benign  - COMPLETE METABOLIC PANEL WITH GFR - amLODipine (NORVASC) 10 MG tablet; Take 1  tablet (10 mg total) by mouth daily.  Dispense: 90 tablet; Refill: 3 - hydrochlorothiazide (HYDRODIURIL) 12.5 MG tablet; Take 1 tablet (12.5 mg total) by mouth daily.  Dispense: 90 tablet; Refill: 3 - lisinopril (PRINIVIL,ZESTRIL) 40 MG tablet; Take 1 tablet (40 mg total) by mouth daily.  Dispense: 90 tablet; Refill: 3  3. CKD (chronic kidney disease), stage 3 (moderate) We will check CMP today, if the creatinine level is high we may need to discontinue lisinopril  4. Type II or unspecified type diabetes mellitus without mention of complication, not stated as uncontrolled  - glipiZIDE (GLUCOTROL) 5 MG tablet;  Take 1 tablet (5 mg total) by mouth 2 (two) times daily before a meal.  Dispense: 180 tablet; Refill: 3  5. Dyslipidemia  - Lipid Panel - simvastatin (ZOCOR) 10 MG tablet; Take 1 tablet (10 mg total) by mouth daily at 6 PM.  Dispense: 90 tablet; Refill: 3  6. Colon cancer screening  - [facility] COLONOSCOPY - Ambulatory referral to Gastroenterology  Patient was extensively counseled on nutrition and exercise Patient was counseled again on smoking cessation  Return in about 3 months (around 09/08/2014), or if symptoms worsen or fail to improve, for Hemoglobin A1C and Follow up, DM, Follow up HTN.  The patient was given clear instructions to go to ER or return to medical center if symptoms don't improve, worsen or new problems develop. The patient verbalized understanding. The patient was told to call to get lab results if they haven't heard anything in the next week.   This note has been created with Surveyor, quantity. Any transcriptional errors are unintentional.    Angelica Chessman, MD, [place], [place], [place] and [place], [place]   06/08/2014, 10:06 AM

## 2014-06-08 NOTE — Progress Notes (Signed)
Pt is here following up on her HTN and diabetes. Pt has no C.C. today

## 2014-06-09 LAB — COMPLETE METABOLIC PANEL WITH GFR
ALT: 8 U/L (ref 0–35)
AST: 16 U/L (ref 0–37)
Albumin: 4.1 g/dL (ref 3.5–5.2)
Alkaline Phosphatase: 54 U/L (ref 39–117)
BILIRUBIN TOTAL: 0.3 mg/dL (ref 0.2–1.2)
BUN: 39 mg/dL — AB (ref 6–23)
CO2: 20 mEq/L (ref 19–32)
Calcium: 9.2 mg/dL (ref 8.4–10.5)
Chloride: 110 mEq/L (ref 96–112)
Creat: 1.67 mg/dL — ABNORMAL HIGH (ref 0.50–1.10)
GFR, EST NON AFRICAN AMERICAN: 32 mL/min — AB
GFR, Est African American: 37 mL/min — ABNORMAL LOW
GLUCOSE: 142 mg/dL — AB (ref 70–99)
Potassium: 5.2 mEq/L (ref 3.5–5.3)
SODIUM: 142 meq/L (ref 135–145)
Total Protein: 7.2 g/dL (ref 6.0–8.3)

## 2014-06-09 LAB — LIPID PANEL
Cholesterol: 119 mg/dL (ref 0–200)
HDL: 48 mg/dL (ref >39)
LDL Cholesterol: 59 mg/dL (ref 0–99)
Total CHOL/HDL Ratio: 2.5 Ratio
Triglycerides: 62 mg/dL (ref <150)
VLDL: 12 mg/dL (ref 0–40)

## 2014-06-10 ENCOUNTER — Encounter: Payer: Self-pay | Admitting: Internal Medicine

## 2014-06-17 ENCOUNTER — Telehealth: Payer: Self-pay | Admitting: Emergency Medicine

## 2014-06-17 NOTE — Telephone Encounter (Signed)
Message copied by Ricci Barker on Wed Jun 17, 2014  6:01 PM ------      Message from: Angelica Chessman E      Created: Wed Jun 17, 2014  5:24 PM       Patient that from her laboratory tests results, there is evidence of kidney damage from diabetes. We'll continue to controlled blood sugar and monitor kidney function going forward. ------

## 2014-06-17 NOTE — Telephone Encounter (Signed)
Attempted to reach pt with lab results but no answer. Will retry later.

## 2014-07-15 ENCOUNTER — Other Ambulatory Visit: Payer: Self-pay | Admitting: Internal Medicine

## 2014-07-15 DIAGNOSIS — I10 Essential (primary) hypertension: Secondary | ICD-10-CM

## 2014-07-29 ENCOUNTER — Other Ambulatory Visit: Payer: Self-pay | Admitting: *Deleted

## 2014-07-29 ENCOUNTER — Telehealth: Payer: Self-pay | Admitting: Internal Medicine

## 2014-07-29 DIAGNOSIS — E785 Hyperlipidemia, unspecified: Secondary | ICD-10-CM

## 2014-07-29 DIAGNOSIS — I10 Essential (primary) hypertension: Secondary | ICD-10-CM

## 2014-07-29 DIAGNOSIS — E119 Type 2 diabetes mellitus without complications: Secondary | ICD-10-CM

## 2014-07-29 MED ORDER — LISINOPRIL 40 MG PO TABS: 40.0000 mg | ORAL_TABLET | Freq: Every day | ORAL | Status: DC

## 2014-07-29 MED ORDER — HYDROCHLOROTHIAZIDE 12.5 MG PO TABS: 12.5000 mg | ORAL_TABLET | Freq: Every day | ORAL | Status: DC

## 2014-07-29 MED ORDER — SIMVASTATIN 10 MG PO TABS: 10.0000 mg | ORAL_TABLET | Freq: Every day | ORAL | Status: DC

## 2014-07-29 MED ORDER — GLIPIZIDE 5 MG PO TABS: 5.0000 mg | ORAL_TABLET | Freq: Two times a day (BID) | ORAL | Status: DC

## 2014-07-29 MED ORDER — AMLODIPINE BESYLATE 10 MG PO TABS: ORAL_TABLET | ORAL | Status: DC

## 2014-07-29 NOTE — Telephone Encounter (Signed)
I reorder her medication and sent them to [facility] so she can have her rx mailed to her.

## 2014-07-29 NOTE — Telephone Encounter (Signed)
Pt. Calling because she needs to change her medications changed to go through [facility] , pt would like all of her current medications changed to the [facility] . Please f/u with pt.

## 2014-08-05 ENCOUNTER — Ambulatory Visit (AMBULATORY_SURGERY_CENTER): Payer: Commercial Managed Care - HMO | Admitting: *Deleted

## 2014-08-05 VITALS — Ht 62.0 in | Wt 151.0 lb

## 2014-08-05 DIAGNOSIS — Z1211 Encounter for screening for malignant neoplasm of colon: Secondary | ICD-10-CM

## 2014-08-05 MED ORDER — MOVIPREP 100 G PO SOLR: ORAL | Status: DC

## 2014-08-05 NOTE — Progress Notes (Addendum)
Patient denies any allergies to eggs or soy. Patient denies any problems with anesthesia/sedation. Patient denies any oxygen use at home and does not take any diet/weight loss medications. No computer use per patient.

## 2014-08-19 ENCOUNTER — Ambulatory Visit (AMBULATORY_SURGERY_CENTER): Payer: Commercial Managed Care - HMO | Admitting: Internal Medicine

## 2014-08-19 ENCOUNTER — Encounter: Payer: Self-pay | Admitting: Internal Medicine

## 2014-08-19 VITALS — BP 132/76 | HR 55 | Temp 96.8°F | Resp 24 | Ht 62.0 in | Wt 151.0 lb

## 2014-08-19 DIAGNOSIS — D124 Benign neoplasm of descending colon: Secondary | ICD-10-CM

## 2014-08-19 DIAGNOSIS — D126 Benign neoplasm of colon, unspecified: Secondary | ICD-10-CM

## 2014-08-19 DIAGNOSIS — Z1211 Encounter for screening for malignant neoplasm of colon: Secondary | ICD-10-CM

## 2014-08-19 LAB — GLUCOSE, CAPILLARY
GLUCOSE-CAPILLARY: 79 mg/dL (ref 70–99)
GLUCOSE-CAPILLARY: 142 mg/dL — AB (ref 70–99)

## 2014-08-19 MED ORDER — DEXTROSE 5 % IV SOLN: INTRAVENOUS | Status: DC

## 2014-08-19 MED ORDER — SODIUM CHLORIDE 0.9 % IV SOLN: 500.0000 mL | INTRAVENOUS | Status: DC

## 2014-08-19 NOTE — Progress Notes (Signed)
Prtocedure ends, to recovery, report given and VSS.

## 2014-08-19 NOTE — Progress Notes (Signed)
Called to room to assist during endoscopic procedure.  Patient ID and intended procedure confirmed with present staff. Received instructions for my participation in the procedure from the performing physician.  

## 2014-08-19 NOTE — Op Note (Signed)
[facility]  [employer]. [place], [id number]   COLONOSCOPY PROCEDURE REPORT  PATIENT: Andrea Brown, Andrea Brown  MR#: [id number] BIRTHDATE: 1951/09/20 , 31  yrs. old GENDER: Female ENDOSCOPIST: Jerene Bears, MD REFERRED [id number] Doreene Burke, M.D. PROCEDURE DATE:  08/19/2014 PROCEDURE:   Colonoscopy with cold biopsy polypectomy First Screening Colonoscopy - Avg.  risk and is 50 yrs.  old or older Yes.  Prior Negative Screening - Now for repeat screening. N/A  History of Adenoma - Now for follow-up colonoscopy & has been > or = to 3 yrs.  N/A  Polyps Removed Today? Yes. ASA CLASS:   Class II INDICATIONS:average risk screening and first colonoscopy. MEDICATIONS: MAC sedation, administered by CRNA and Propofol (Diprivan) 310 mg IV  DESCRIPTION OF PROCEDURE:   After the risks benefits and alternatives of the procedure were thoroughly explained, informed consent was obtained.  A digital rectal exam revealed no rectal mass.   The LB [id number]-[id number]  endoscope was introduced through the anus and advanced to the cecum, which was identified by both the appendix and ileocecal valve. No adverse events experienced. The quality of the prep was good, using MoviPrep  The instrument was then slowly withdrawn as the colon was fully examined.  COLON FINDINGS: A sessile polyp measuring 3 mm in size was found at the splenic flexure.  A polypectomy was performed with cold forceps.  The resection was complete and the polyp tissue was completely retrieved.   There was mild scattered diverticulosis noted at the splenic flexure, in the descending colon, and sigmoid colon.  Retroflexion was not performed due to a narrow rectal vault. The time to cecum=6 minutes 41 seconds.  Withdrawal time=10 minutes 38 seconds.  The scope was withdrawn and the procedure completed. COMPLICATIONS: There were no complications.  ENDOSCOPIC IMPRESSION: 1.   Sessile polyp measuring 3 mm in size was found at the  splenic flexure; polypectomy was performed with cold forceps 2.   There was mild diverticulosis noted at the splenic flexure, in the descending colon, and sigmoid colon  RECOMMENDATIONS: 1.  Await pathology results 2.  High fiber diet 3.  If the polyp removed today is proven to be an adenomatous (pre-cancerous) polyp, you will need a repeat colonoscopy in 5 years.  Otherwise you should continue to follow colorectal cancer screening guidelines for "routine risk" patients with colonoscopy in 10 years.  You will receive a letter within 1-2 weeks with the results of your biopsy as well as final recommendations.  Please call my office if you have not received a letter after 3 weeks.   eSigned:  Jerene Bears, MD 08/19/2014 10:49 AM   cc: The Patient and Angelica Chessman MD

## 2014-08-19 NOTE — Progress Notes (Signed)
Pt's blood sugar was 79 on admission.  No sx noted per the pt.  Hung D5W IV.  Holland Falling, CRNA was advised of this.  Only had 5 minutes from last blood sugar taken and they were ready to take her to the procedure room.  He said no need to recheck blood sugar before procedure. maw

## 2014-08-20 ENCOUNTER — Telehealth: Payer: Self-pay

## 2014-08-20 NOTE — Telephone Encounter (Signed)
  Follow up Call-  Call back number 08/19/2014  Post procedure Call Back phone  # [phone] hm  Permission to leave phone message Yes     Patient questions:  Do you have a fever, pain , or abdominal swelling? No. Pain Score  0 *  Have you tolerated food without any problems? Yes.    Have you been able to return to your normal activities? Yes.    Do you have any questions about your discharge instructions: Diet   No. Medications  No. Follow up visit  No.  Do you have questions or concerns about your Care? No.  Actions: * If pain score is 4 or above: No action needed, pain <4.  No problems per the pt. maw

## 2014-08-25 ENCOUNTER — Encounter: Payer: Self-pay | Admitting: Internal Medicine

## 2014-09-08 ENCOUNTER — Encounter: Payer: Self-pay | Admitting: Internal Medicine

## 2014-09-08 ENCOUNTER — Ambulatory Visit: Payer: Commercial Managed Care - HMO | Attending: Internal Medicine | Admitting: Internal Medicine

## 2014-09-08 VITALS — BP 156/74 | HR 74 | Temp 98.7°F | Resp 16 | Ht 61.5 in | Wt 149.0 lb

## 2014-09-08 DIAGNOSIS — E785 Hyperlipidemia, unspecified: Secondary | ICD-10-CM | POA: Diagnosis not present

## 2014-09-08 DIAGNOSIS — Z7982 Long term (current) use of aspirin: Secondary | ICD-10-CM | POA: Insufficient documentation

## 2014-09-08 DIAGNOSIS — N183 Chronic kidney disease, stage 3 (moderate): Secondary | ICD-10-CM | POA: Diagnosis not present

## 2014-09-08 DIAGNOSIS — E119 Type 2 diabetes mellitus without complications: Secondary | ICD-10-CM | POA: Diagnosis not present

## 2014-09-08 DIAGNOSIS — I129 Hypertensive chronic kidney disease with stage 1 through stage 4 chronic kidney disease, or unspecified chronic kidney disease: Secondary | ICD-10-CM | POA: Insufficient documentation

## 2014-09-08 DIAGNOSIS — I10 Essential (primary) hypertension: Secondary | ICD-10-CM | POA: Diagnosis not present

## 2014-09-08 LAB — GLUCOSE, POCT (MANUAL RESULT ENTRY): POC GLUCOSE: 120 mg/dL — AB (ref 70–99)

## 2014-09-08 LAB — POCT GLYCOSYLATED HEMOGLOBIN (HGB A1C): Hemoglobin A1C: 6.3

## 2014-09-08 NOTE — Progress Notes (Signed)
Patient ID: Andrea Brown, female   DOB: December 14, 1950, 63 y.o.   MRN: [id number]   Andrea Brown, is a 63 y.o. female  [id number]  [id number]  DOB - [birth year]  Chief Complaint  Patient presents with  . Follow-up        Subjective:   Andrea Brown is a 63 y.o. female here today for a follow up visit. Patient has history of hypertension, diabetes mellitus, hyperlipidemia, here today for routine follow-up. Patient has no complaint today. Patient claims compliance with medications. Blood pressure is controlled. Blood sugars are better managed. Last hemoglobin A1c was 6.6%.Patient has No headache, No chest pain, No abdominal pain - No Nausea, No new weakness tingling or numbness, No Cough - SOB.  No problems updated.  ALLERGIES: No Known Allergies  PAST MEDICAL HISTORY: Past Medical History  Diagnosis Date  . Hypertension   . Hyperlipidemia   . Diabetes mellitus without complication     MEDICATIONS AT HOME: Prior to Admission medications   Medication Sig Start Date End Date Taking? Authorizing Provider  amLODipine (NORVASC) 10 MG tablet TAKE 1 TABLET BY MOUTH ONCE DAILY 07/29/14  Yes Tresa Garter, MD  aspirin 81 MG tablet Take 1 tablet (81 mg total) by mouth daily. 12/08/13  Yes Reyne Dumas, MD  glipiZIDE (GLUCOTROL) 5 MG tablet Take 1 tablet (5 mg total) by mouth 2 (two) times daily before a meal. 07/29/14  Yes Delara Shepheard E Doreene Burke, MD  hydrochlorothiazide (HYDRODIURIL) 12.5 MG tablet Take 1 tablet (12.5 mg total) by mouth daily. 07/29/14  Yes Tresa Garter, MD  lisinopril (PRINIVIL,ZESTRIL) 40 MG tablet Take 1 tablet (40 mg total) by mouth daily. 07/29/14  Yes Tresa Garter, MD  pravastatin (PRAVACHOL) 20 MG tablet Take 1 tablet (20 mg total) by mouth daily. 12/08/13  Yes Reyne Dumas, MD  simvastatin (ZOCOR) 10 MG tablet Take 1 tablet (10 mg total) by mouth daily at 6 PM. 07/29/14  Yes Tresa Garter, MD     Objective:   Filed Vitals:   09/08/14 0940  BP:  156/74  Pulse: 74  Temp: 98.7 F (37.1 C)  TempSrc: Oral  Resp: 16  Height: 5' 1.5" (1.562 m)  Weight: 149 lb (67.586 kg)  SpO2: 98%    Exam General appearance : Awake, alert, not in any distress. Speech Clear. Not toxic looking HEENT: Atraumatic and Normocephalic, pupils equally reactive to light and accomodation Neck: supple, no JVD. No cervical lymphadenopathy.  Chest:Good air entry bilaterally, no added sounds  CVS: S1 S2 regular, no murmurs.  Abdomen: Bowel sounds present, Non tender and not distended with no gaurding, rigidity or rebound. Extremities: B/L Lower Ext shows no edema, both legs are warm to touch Neurology: Awake alert, and oriented X 3, CN II-XII intact, Non focal Skin:No Rash Wounds:N/A  Data Review Lab Results  Component Value Date   HGBA1C 6.3 09/08/2014   HGBA1C 6.6 06/08/2014   HGBA1C 7.3 03/09/2014     Assessment & Plan   1. Type 2 diabetes mellitus without complication  - Glucose (CBG) - HgB A1c  Aim for 2-3 Carb Choices per meal (30-45 grams) +/- 1 either way  Aim for 0-15 Carbs per snack if hungry  Include protein in moderation with your meals and snacks  Consider reading food labels for Total Carbohydrate and Fat Grams of foods  Consider checking BG at alternate times per day  Continue taking medication as directed Fruit Punch - find one with no sugar  Measure and  decrease portions of carbohydrate foods  Make your plate and don't go back for seconds   2. CKD (chronic kidney disease), stage 3 (moderate) Follow-up with nephrologist  3. Essential hypertension, benign  - We have discussed target BP range and blood pressure goal - I have advised patient to check BP regularly and to call us back or report to clinic if the numbers are consistently higher than 140/90  - We discussed the importance of compliance with medical therapy and DASH diet recommended, consequences of uncontrolled hypertension discussed.  - continue current BP  medications   Return in about 3 months (around 12/09/2014), or if symptoms worsen or fail to improve, for Hemoglobin A1C and Follow up, DM, Follow up HTN.  The patient was given clear instructions to go to ER or return to medical center if symptoms don't improve, worsen or new problems develop. The patient verbalized understanding. The patient was told to call to get lab results if they haven't heard anything in the next week.   This note has been created with Surveyor, quantity. Any transcriptional errors are unintentional.    Angelica Chessman, MD, [facility], [place], [place] [facility] [place], [place]   09/08/2014, 10:19 AM

## 2014-09-08 NOTE — Progress Notes (Signed)
Pt is here following up on her HTN and diabetes.

## 2014-09-08 NOTE — Patient Instructions (Signed)
Hypertension Hypertension, commonly called high blood pressure, is when the force of blood pumping through your arteries is too strong. Your arteries are the blood vessels that carry blood from your heart throughout your body. A blood pressure reading consists of a higher number over a lower number, such as 110/72. The higher number (systolic) is the pressure inside your arteries when your heart pumps. The lower number (diastolic) is the pressure inside your arteries when your heart relaxes. Ideally you want your blood pressure below 120/80. Hypertension forces your heart to work harder to pump blood. Your arteries may become narrow or stiff. Having hypertension puts you at risk for heart disease, stroke, and other problems.  RISK FACTORS Some risk factors for high blood pressure are controllable. Others are not.  Risk factors you cannot control include:   Race. You may be at higher risk if you are African American.  Age. Risk increases with age.  Gender. Men are at higher risk than women before age 45 years. After age 65, women are at higher risk than men. Risk factors you can control include:  Not getting enough exercise or physical activity.  Being overweight.  Getting too much fat, sugar, calories, or salt in your diet.  Drinking too much alcohol. SIGNS AND SYMPTOMS Hypertension does not usually cause signs or symptoms. Extremely high blood pressure (hypertensive crisis) may cause headache, anxiety, shortness of breath, and nosebleed. DIAGNOSIS  To check if you have hypertension, your health care provider will measure your blood pressure while you are seated, with your arm held at the level of your heart. It should be measured at least twice using the same arm. Certain conditions can cause a difference in blood pressure between your right and left arms. A blood pressure reading that is higher than normal on one occasion does not mean that you need treatment. If one blood pressure reading  is high, ask your health care provider about having it checked again. TREATMENT  Treating high blood pressure includes making lifestyle changes and possibly taking medicine. Living a healthy lifestyle can help lower high blood pressure. You may need to change some of your habits. Lifestyle changes may include:  Following the DASH diet. This diet is high in fruits, vegetables, and whole grains. It is low in salt, red meat, and added sugars.  Getting at least 2 hours of brisk physical activity every week.  Losing weight if necessary.  Not smoking.  Limiting alcoholic beverages.  Learning ways to reduce stress. If lifestyle changes are not enough to get your blood pressure under control, your health care provider may prescribe medicine. You may need to take more than one. Work closely with your health care provider to understand the risks and benefits. HOME CARE INSTRUCTIONS  Have your blood pressure rechecked as directed by your health care provider.   Take medicines only as directed by your health care provider. Follow the directions carefully. Blood pressure medicines must be taken as prescribed. The medicine does not work as well when you skip doses. Skipping doses also puts you at risk for problems.   Do not smoke.   Monitor your blood pressure at home as directed by your health care provider. SEEK MEDICAL CARE IF:   You think you are having a reaction to medicines taken.  You have recurrent headaches or feel dizzy.  You have swelling in your ankles.  You have trouble with your vision. SEEK IMMEDIATE MEDICAL CARE IF:  You develop a severe headache or confusion.    You have unusual weakness, numbness, or feel faint.  You have severe chest or abdominal pain.  You vomit repeatedly.  You have trouble breathing. MAKE SURE YOU:   Understand these instructions.  Will watch your condition.  Will get help right away if you are not doing well or get worse. Document  Released: 11/20/2005 Document Revised: 04/06/2014 Document Reviewed: 09/12/2013 [employer] Patient Information 2015 [employer]. This information is not intended to replace advice given to you by your health care provider. Make sure you discuss any questions you have with your health care provider. DASH Eating Plan DASH stands for "Dietary Approaches to Stop Hypertension." The DASH eating plan is a healthy eating plan that has been shown to reduce high blood pressure (hypertension). Additional health benefits may include reducing the risk of type 2 diabetes mellitus, heart disease, and stroke. The DASH eating plan may also help with weight loss. WHAT DO I NEED TO KNOW ABOUT THE DASH EATING PLAN? For the DASH eating plan, you will follow these general guidelines:  Choose foods with a percent daily value for sodium of less than 5% (as listed on the food label).  Use salt-free seasonings or herbs instead of table salt or sea salt.  Check with your health care provider or pharmacist before using salt substitutes.  Eat lower-sodium products, often labeled as "lower sodium" or "no salt added."  Eat fresh foods.  Eat more vegetables, fruits, and low-fat dairy products.  Choose whole grains. Look for the word "whole" as the first word in the ingredient list.  Choose fish and skinless chicken or turkey more often than red meat. Limit fish, poultry, and meat to 6 oz (170 g) each day.  Limit sweets, desserts, sugars, and sugary drinks.  Choose heart-healthy fats.  Limit cheese to 1 oz (28 g) per day.  Eat more home-cooked food and less restaurant, buffet, and fast food.  Limit fried foods.  Cook foods using methods other than frying.  Limit canned vegetables. If you do use them, rinse them well to decrease the sodium.  When eating at a restaurant, ask that your food be prepared with less salt, or no salt if possible. WHAT FOODS CAN I EAT? Seek help from a dietitian for individual  calorie needs. Grains Whole grain or whole wheat bread. Brown rice. Whole grain or whole wheat pasta. Quinoa, bulgur, and whole grain cereals. Low-sodium cereals. Corn or whole wheat flour tortillas. Whole grain cornbread. Whole grain crackers. Low-sodium crackers. Vegetables Fresh or frozen vegetables (raw, steamed, roasted, or grilled). Low-sodium or reduced-sodium tomato and vegetable juices. Low-sodium or reduced-sodium tomato sauce and paste. Low-sodium or reduced-sodium canned vegetables.  Fruits All fresh, canned (in natural juice), or frozen fruits. Meat and Other Protein Products Ground beef (85% or leaner), grass-fed beef, or beef trimmed of fat. Skinless chicken or turkey. Ground chicken or turkey. Pork trimmed of fat. All fish and seafood. Eggs. Dried beans, peas, or lentils. Unsalted nuts and seeds. Unsalted canned beans. Dairy Low-fat dairy products, such as skim or 1% milk, 2% or reduced-fat cheeses, low-fat ricotta or cottage cheese, or plain low-fat yogurt. Low-sodium or reduced-sodium cheeses. Fats and Oils Tub margarines without trans fats. Light or reduced-fat mayonnaise and salad dressings (reduced sodium). Avocado. Safflower, olive, or canola oils. Natural peanut or almond butter. Other Unsalted popcorn and pretzels. The items listed above may not be a complete list of recommended foods or beverages. Contact your dietitian for more options. WHAT FOODS ARE NOT RECOMMENDED? Grains White bread.   White pasta. White rice. Refined cornbread. Bagels and croissants. Crackers that contain trans fat. Vegetables Creamed or fried vegetables. Vegetables in a cheese sauce. Regular canned vegetables. Regular canned tomato sauce and paste. Regular tomato and vegetable juices. Fruits Dried fruits. Canned fruit in light or heavy syrup. Fruit juice. Meat and Other Protein Products Fatty cuts of meat. Ribs, chicken wings, bacon, sausage, bologna, salami, chitterlings, fatback, hot dogs,  bratwurst, and packaged luncheon meats. Salted nuts and seeds. Canned beans with salt. Dairy Whole or 2% milk, cream, half-and-half, and cream cheese. Whole-fat or sweetened yogurt. Full-fat cheeses or blue cheese. Nondairy creamers and whipped toppings. Processed cheese, cheese spreads, or cheese curds. Condiments Onion and garlic salt, seasoned salt, table salt, and sea salt. Canned and packaged gravies. Worcestershire sauce. Tartar sauce. Barbecue sauce. Teriyaki sauce. Soy sauce, including reduced sodium. Steak sauce. Fish sauce. Oyster sauce. Cocktail sauce. Horseradish. Ketchup and mustard. Meat flavorings and tenderizers. Bouillon cubes. Hot sauce. Tabasco sauce. Marinades. Taco seasonings. Relishes. Fats and Oils Butter, stick margarine, lard, shortening, ghee, and bacon fat. Coconut, palm kernel, or palm oils. Regular salad dressings. Other Pickles and olives. Salted popcorn and pretzels. The items listed above may not be a complete list of foods and beverages to avoid. Contact your dietitian for more information. WHERE CAN I FIND MORE INFORMATION? National Heart, Lung, and Blood Institute: travelstabloid.com Document Released: 11/09/2011 Document Revised: 04/06/2014 Document Reviewed: 09/24/2013 [facility] Patient Information 2015 [place], [place]. This information is not intended to replace advice given to you by your health care provider. Make sure you discuss any questions you have with your health care provider. Diabetes Mellitus and Food It is important for you to manage your blood sugar (glucose) level. Your blood glucose level can be greatly affected by what you eat. Eating healthier foods in the appropriate amounts throughout the day at about the same time each day will help you control your blood glucose level. It can also help slow or prevent worsening of your diabetes mellitus. Healthy eating may even help you improve the level of your blood pressure  and reach or maintain a healthy weight.  HOW CAN FOOD AFFECT ME? Carbohydrates Carbohydrates affect your blood glucose level more than any other type of food. Your dietitian will help you determine how many carbohydrates to eat at each meal and teach you how to count carbohydrates. Counting carbohydrates is important to keep your blood glucose at a healthy level, especially if you are using insulin or taking certain medicines for diabetes mellitus. Alcohol Alcohol can cause sudden decreases in blood glucose (hypoglycemia), especially if you use insulin or take certain medicines for diabetes mellitus. Hypoglycemia can be a life-threatening condition. Symptoms of hypoglycemia (sleepiness, dizziness, and disorientation) are similar to symptoms of having too much alcohol.  If your health care provider has given you approval to drink alcohol, do so in moderation and use the following guidelines:  Women should not have more than one drink per day, and men should not have more than two drinks per day. One drink is equal to:  12 oz of beer.  5 oz of wine.  1 oz of hard liquor.  Do not drink on an empty stomach.  Keep yourself hydrated. Have water, diet soda, or unsweetened iced tea.  Regular soda, juice, and other mixers might contain a lot of carbohydrates and should be counted. WHAT FOODS ARE NOT RECOMMENDED? As you make food choices, it is important to remember that all foods are not the same. Some  foods have fewer nutrients per serving than other foods, even though they might have the same number of calories or carbohydrates. It is difficult to get your body what it needs when you eat foods with fewer nutrients. Examples of foods that you should avoid that are high in calories and carbohydrates but low in nutrients include:  Trans fats (most processed foods list trans fats on the Nutrition Facts label).  Regular soda.  Juice.  Candy.  Sweets, such as cake, pie, doughnuts, and  cookies.  Fried foods. WHAT FOODS CAN I EAT? Have nutrient-rich foods, which will nourish your body and keep you healthy. The food you should eat also will depend on several factors, including:  The calories you need.  The medicines you take.  Your weight.  Your blood glucose level.  Your blood pressure level.  Your cholesterol level. You also should eat a variety of foods, including:  Protein, such as meat, poultry, fish, tofu, nuts, and seeds (lean animal proteins are best).  Fruits.  Vegetables.  Dairy products, such as milk, cheese, and yogurt (low fat is best).  Breads, grains, pasta, cereal, rice, and beans.  Fats such as olive oil, trans fat-free margarine, canola oil, avocado, and olives. DOES EVERYONE WITH DIABETES MELLITUS HAVE THE SAME MEAL PLAN? Because every person with diabetes mellitus is different, there is not one meal plan that works for everyone. It is very important that you meet with a dietitian who will help you create a meal plan that is just right for you. Document Released: 08/17/2005 Document Revised: 11/25/2013 Document Reviewed: 10/17/2013 [facility] Patient Information 2015 [place], [place]. This information is not intended to replace advice given to you by your health care provider. Make sure you discuss any questions you have with your health care provider. Diabetes and Exercise Exercising regularly is important. It is not just about losing weight. It has many health benefits, such as:  Improving your overall fitness, flexibility, and endurance.  Increasing your bone density.  Helping with weight control.  Decreasing your body fat.  Increasing your muscle strength.  Reducing stress and tension.  Improving your overall health. People with diabetes who exercise gain additional benefits because exercise:  Reduces appetite.  Improves the body's use of blood sugar (glucose).  Helps lower or control blood glucose.  Decreases blood  pressure.  Helps control blood lipids (such as cholesterol and triglycerides).  Improves the body's use of the hormone insulin by:  Increasing the body's insulin sensitivity.  Reducing the body's insulin needs.  Decreases the risk for heart disease because exercising:  Lowers cholesterol and triglycerides levels.  Increases the levels of good cholesterol (such as high-density lipoproteins [HDL]) in the body.  Lowers blood glucose levels. YOUR ACTIVITY PLAN  Choose an activity that you enjoy and set realistic goals. Your health care provider or diabetes educator can help you make an activity plan that works for you. Exercise regularly as directed by your health care provider. This includes:  Performing resistance training twice a week such as push-ups, sit-ups, lifting weights, or using resistance bands.  Performing 150 minutes of cardio exercises each week such as walking, running, or playing sports.  Staying active and spending no more than 90 minutes at one time being inactive. Even short bursts of exercise are good for you. Three 10-minute sessions spread throughout the day are just as beneficial as a single 30-minute session. Some exercise ideas include:  Taking the dog for a walk.  Taking the stairs instead of  the elevator.  Dancing to your favorite song.  Doing an exercise video.  Doing your favorite exercise with a friend. RECOMMENDATIONS FOR EXERCISING WITH TYPE 1 OR TYPE 2 DIABETES   Check your blood glucose before exercising. If blood glucose levels are greater than 240 mg/dL, check for urine ketones. Do not exercise if ketones are present.  Avoid injecting insulin into areas of the body that are going to be exercised. For example, avoid injecting insulin into:  The arms when playing tennis.  The legs when jogging.  Keep a record of:  Food intake before and after you exercise.  Expected peak times of insulin action.  Blood glucose levels before and after  you exercise.  The type and amount of exercise you have done.  Review your records with your health care provider. Your health care provider will help you to develop guidelines for adjusting food intake and insulin amounts before and after exercising.  If you take insulin or oral hypoglycemic agents, watch for signs and symptoms of hypoglycemia. They include:  Dizziness.  Shaking.  Sweating.  Chills.  Confusion.  Drink plenty of water while you exercise to prevent dehydration or heat stroke. Body water is lost during exercise and must be replaced.  Talk to your health care provider before starting an exercise program to make sure it is safe for you. Remember, almost any type of activity is better than none. Document Released: 02/10/2004 Document Revised: 04/06/2014 Document Reviewed: 04/29/2013 [facility] Patient Information 2015 [place], [place]. This information is not intended to replace advice given to you by your health care provider. Make sure you discuss any questions you have with your health care provider.

## 2015-01-04 ENCOUNTER — Other Ambulatory Visit: Payer: Self-pay | Admitting: Internal Medicine

## 2015-01-14 ENCOUNTER — Encounter: Payer: Self-pay | Admitting: Internal Medicine

## 2015-01-14 ENCOUNTER — Ambulatory Visit: Payer: Commercial Managed Care - HMO | Attending: Internal Medicine | Admitting: Internal Medicine

## 2015-01-14 VITALS — BP 161/75 | HR 71 | Temp 98.0°F | Resp 16 | Ht 62.0 in | Wt 149.0 lb

## 2015-01-14 DIAGNOSIS — Z7982 Long term (current) use of aspirin: Secondary | ICD-10-CM | POA: Diagnosis not present

## 2015-01-14 DIAGNOSIS — F17210 Nicotine dependence, cigarettes, uncomplicated: Secondary | ICD-10-CM | POA: Diagnosis not present

## 2015-01-14 DIAGNOSIS — E785 Hyperlipidemia, unspecified: Secondary | ICD-10-CM | POA: Diagnosis not present

## 2015-01-14 DIAGNOSIS — I10 Essential (primary) hypertension: Secondary | ICD-10-CM | POA: Diagnosis not present

## 2015-01-14 DIAGNOSIS — E119 Type 2 diabetes mellitus without complications: Secondary | ICD-10-CM | POA: Insufficient documentation

## 2015-01-14 LAB — GLUCOSE, POCT (MANUAL RESULT ENTRY): POC GLUCOSE: 132 mg/dL — AB (ref 70–99)

## 2015-01-14 LAB — POCT GLYCOSYLATED HEMOGLOBIN (HGB A1C): Hemoglobin A1C: 6

## 2015-01-14 MED ORDER — SIMVASTATIN 10 MG PO TABS: 10.0000 mg | ORAL_TABLET | Freq: Every day | ORAL | Status: DC

## 2015-01-14 MED ORDER — LISINOPRIL 40 MG PO TABS: 40.0000 mg | ORAL_TABLET | Freq: Every day | ORAL | Status: DC

## 2015-01-14 MED ORDER — HYDROCHLOROTHIAZIDE 25 MG PO TABS: 25.0000 mg | ORAL_TABLET | Freq: Every day | ORAL | Status: DC

## 2015-01-14 MED ORDER — AMLODIPINE BESYLATE 10 MG PO TABS: 10.0000 mg | ORAL_TABLET | Freq: Every day | ORAL | Status: DC

## 2015-01-14 MED ORDER — GLIPIZIDE 5 MG PO TABS: 5.0000 mg | ORAL_TABLET | Freq: Two times a day (BID) | ORAL | Status: DC

## 2015-01-14 NOTE — Progress Notes (Signed)
Patient ID: Andrea Brown, female   DOB: 10/22/1951, 64 y.o.   MRN: [id number]   Andrea Brown, is a 64 y.o. female  [id number]  [id number]  DOB - November 03, 1951  Chief Complaint  Patient presents with  . Follow-up        Subjective:   Andrea Brown is a 64 y.o. female here today for a follow up visit. Patient has history of hypertension, diabetes mellitus without complication, and hyperlipidemia, here today for routine follow-up of diabetes and hypertension. Patient has no complaint, compliant with all medications, up-to-date on her mammogram and colonoscopy. She has stopped have a Pap smear. Patient continues to smoke cigarettes about half a pack per day. She drinks alcohol occasionally. Patient has No headache, No chest pain, No abdominal pain - No Nausea, No new weakness tingling or numbness, No Cough - SOB.  No problems updated.  ALLERGIES: No Known Allergies  PAST MEDICAL HISTORY: Past Medical History  Diagnosis Date  . Hypertension   . Hyperlipidemia   . Diabetes mellitus without complication     MEDICATIONS AT HOME: Prior to Admission medications   Medication Sig Start Date End Date Taking? Authorizing Provider  amLODipine (NORVASC) 10 MG tablet Take 1 tablet (10 mg total) by mouth daily. 01/14/15  Yes Tresa Garter, MD  aspirin 81 MG tablet Take 1 tablet (81 mg total) by mouth daily. 12/08/13  Yes Reyne Dumas, MD  glipiZIDE (GLUCOTROL) 5 MG tablet Take 1 tablet (5 mg total) by mouth 2 (two) times daily before a meal. 01/14/15  Yes Feiga Nadel E Doreene Burke, MD  hydrochlorothiazide (HYDRODIURIL) 25 MG tablet Take 1 tablet (25 mg total) by mouth daily. 01/14/15  Yes Tresa Garter, MD  lisinopril (PRINIVIL,ZESTRIL) 40 MG tablet Take 1 tablet (40 mg total) by mouth daily. 01/14/15  Yes Tresa Garter, MD  simvastatin (ZOCOR) 10 MG tablet Take 1 tablet (10 mg total) by mouth daily at 6 PM. 01/14/15  Yes Tresa Garter, MD     Objective:   Filed Vitals:   01/14/15  0928  BP: 161/75  Pulse: 71  Temp: 98 F (36.7 C)  TempSrc: Oral  Resp: 16  Height: 5\' 2"  (1.575 m)  Weight: 149 lb (67.586 kg)  SpO2: 100%    Exam General appearance : Awake, alert, not in any distress. Speech Clear. Not toxic looking HEENT: Atraumatic and Normocephalic, pupils equally reactive to light and accomodation Neck: supple, no JVD. No cervical lymphadenopathy.  Chest:Good air entry bilaterally, no added sounds  CVS: S1 S2 regular, no murmurs.  Abdomen: Bowel sounds present, Non tender and not distended with no gaurding, rigidity or rebound. Extremities: B/L Lower Ext shows no edema, both legs are warm to touch Neurology: Awake alert, and oriented X 3, CN II-XII intact, Non focal Skin:No Rash Wounds:N/A  Data Review Lab Results  Component Value Date   HGBA1C 6.0 01/14/2015   HGBA1C 6.3 09/08/2014   HGBA1C 6.6 06/08/2014     Assessment & Plan   1. Type 2 diabetes mellitus without complication  - Glucose (CBG) - HgB A1c is 6.0% today Refill - glipiZIDE (GLUCOTROL) 5 MG tablet; Take 1 tablet (5 mg total) by mouth 2 (two) times daily before a meal.  Dispense: 180 tablet; Refill: 3  2. Essential hypertension, benign Refill - amLODipine (NORVASC) 10 MG tablet; Take 1 tablet (10 mg total) by mouth daily.  Dispense: 90 tablet; Refill: 3 - hydrochlorothiazide (HYDRODIURIL) 25 MG tablet; Take 1 tablet (25 mg total) by  mouth daily.  Dispense: 90 tablet; Refill: 3 - lisinopril (PRINIVIL,ZESTRIL) 40 MG tablet; Take 1 tablet (40 mg total) by mouth daily.  Dispense: 90 tablet; Refill: 3  3. Dyslipidemia Refill - simvastatin (ZOCOR) 10 MG tablet; Take 1 tablet (10 mg total) by mouth daily at 6 PM.  Dispense: 90 tablet; Refill: 3   Patient was counseled extensively about nutrition and exercise Patient was extensively counseled on smoking cessation.  Return in about 3 months (around 04/14/2015), or if symptoms worsen or fail to improve, for Hemoglobin A1C and Follow up,  DM, Follow up HTN.  The patient was given clear instructions to go to ER or return to medical center if symptoms don't improve, worsen or new problems develop. The patient verbalized understanding. The patient was told to call to get lab results if they haven't heard anything in the next week.   This note has been created with Surveyor, quantity. Any transcriptional errors are unintentional.    Angelica Chessman, MD, [place], [place], [place] and [facility] [place], [place]   01/14/2015, 10:21 AM

## 2015-01-14 NOTE — Progress Notes (Signed)
Pt is here following up on her HTN, diabetes and her hyperlipidemia. Pt has no C.C. Today. Pt is a current smoker.

## 2015-01-14 NOTE — Patient Instructions (Signed)
Hypertension Hypertension, commonly called high blood pressure, is when the force of blood pumping through your arteries is too strong. Your arteries are the blood vessels that carry blood from your heart throughout your body. A blood pressure reading consists of a higher number over a lower number, such as 110/72. The higher number (systolic) is the pressure inside your arteries when your heart pumps. The lower number (diastolic) is the pressure inside your arteries when your heart relaxes. Ideally you want your blood pressure below 120/80. Hypertension forces your heart to work harder to pump blood. Your arteries may become narrow or stiff. Having hypertension puts you at risk for heart disease, stroke, and other problems.  RISK FACTORS Some risk factors for high blood pressure are controllable. Others are not.  Risk factors you cannot control include:   Race. You may be at higher risk if you are African American.  Age. Risk increases with age.  Gender. Men are at higher risk than women before age 45 years. After age 65, women are at higher risk than men. Risk factors you can control include:  Not getting enough exercise or physical activity.  Being overweight.  Getting too much fat, sugar, calories, or salt in your diet.  Drinking too much alcohol. SIGNS AND SYMPTOMS Hypertension does not usually cause signs or symptoms. Extremely high blood pressure (hypertensive crisis) may cause headache, anxiety, shortness of breath, and nosebleed. DIAGNOSIS  To check if you have hypertension, your health care provider will measure your blood pressure while you are seated, with your arm held at the level of your heart. It should be measured at least twice using the same arm. Certain conditions can cause a difference in blood pressure between your right and left arms. A blood pressure reading that is higher than normal on one occasion does not mean that you need treatment. If one blood pressure reading  is high, ask your health care provider about having it checked again. TREATMENT  Treating high blood pressure includes making lifestyle changes and possibly taking medicine. Living a healthy lifestyle can help lower high blood pressure. You may need to change some of your habits. Lifestyle changes may include:  Following the DASH diet. This diet is high in fruits, vegetables, and whole grains. It is low in salt, red meat, and added sugars.  Getting at least 2 hours of brisk physical activity every week.  Losing weight if necessary.  Not smoking.  Limiting alcoholic beverages.  Learning ways to reduce stress. If lifestyle changes are not enough to get your blood pressure under control, your health care provider may prescribe medicine. You may need to take more than one. Work closely with your health care provider to understand the risks and benefits. HOME CARE INSTRUCTIONS  Have your blood pressure rechecked as directed by your health care provider.   Take medicines only as directed by your health care provider. Follow the directions carefully. Blood pressure medicines must be taken as prescribed. The medicine does not work as well when you skip doses. Skipping doses also puts you at risk for problems.   Do not smoke.   Monitor your blood pressure at home as directed by your health care provider. SEEK MEDICAL CARE IF:   You think you are having a reaction to medicines taken.  You have recurrent headaches or feel dizzy.  You have swelling in your ankles.  You have trouble with your vision. SEEK IMMEDIATE MEDICAL CARE IF:  You develop a severe headache or confusion.    You have unusual weakness, numbness, or feel faint.  You have severe chest or abdominal pain.  You vomit repeatedly.  You have trouble breathing. MAKE SURE YOU:   Understand these instructions.  Will watch your condition.  Will get help right away if you are not doing well or get worse. Document  Released: 11/20/2005 Document Revised: 04/06/2014 Document Reviewed: 09/12/2013 ExitCare Patient Information 2015 [employer]. This information is not intended to replace advice given to you by your health care provider. Make sure you discuss any questions you have with your health care provider. DASH Eating Plan DASH stands for "Dietary Approaches to Stop Hypertension." The DASH eating plan is a healthy eating plan that has been shown to reduce high blood pressure (hypertension). Additional health benefits may include reducing the risk of type 2 diabetes mellitus, heart disease, and stroke. The DASH eating plan may also help with weight loss. WHAT DO I NEED TO KNOW ABOUT THE DASH EATING PLAN? For the DASH eating plan, you will follow these general guidelines:  Choose foods with a percent daily value for sodium of less than 5% (as listed on the food label).  Use salt-free seasonings or herbs instead of table salt or sea salt.  Check with your health care provider or pharmacist before using salt substitutes.  Eat lower-sodium products, often labeled as "lower sodium" or "no salt added."  Eat fresh foods.  Eat more vegetables, fruits, and low-fat dairy products.  Choose whole grains. Look for the word "whole" as the first word in the ingredient list.  Choose fish and skinless chicken or turkey more often than red meat. Limit fish, poultry, and meat to 6 oz (170 g) each day.  Limit sweets, desserts, sugars, and sugary drinks.  Choose heart-healthy fats.  Limit cheese to 1 oz (28 g) per day.  Eat more home-cooked food and less restaurant, buffet, and fast food.  Limit fried foods.  Cook foods using methods other than frying.  Limit canned vegetables. If you do use them, rinse them well to decrease the sodium.  When eating at a restaurant, ask that your food be prepared with less salt, or no salt if possible. WHAT FOODS CAN I EAT? Seek help from a dietitian for individual  calorie needs. Grains Whole grain or whole wheat bread. Brown rice. Whole grain or whole wheat pasta. Quinoa, bulgur, and whole grain cereals. Low-sodium cereals. Corn or whole wheat flour tortillas. Whole grain cornbread. Whole grain crackers. Low-sodium crackers. Vegetables Fresh or frozen vegetables (raw, steamed, roasted, or grilled). Low-sodium or reduced-sodium tomato and vegetable juices. Low-sodium or reduced-sodium tomato sauce and paste. Low-sodium or reduced-sodium canned vegetables.  Fruits All fresh, canned (in natural juice), or frozen fruits. Meat and Other Protein Products Ground beef (85% or leaner), grass-fed beef, or beef trimmed of fat. Skinless chicken or turkey. Ground chicken or turkey. Pork trimmed of fat. All fish and seafood. Eggs. Dried beans, peas, or lentils. Unsalted nuts and seeds. Unsalted canned beans. Dairy Low-fat dairy products, such as skim or 1% milk, 2% or reduced-fat cheeses, low-fat ricotta or cottage cheese, or plain low-fat yogurt. Low-sodium or reduced-sodium cheeses. Fats and Oils Tub margarines without trans fats. Light or reduced-fat mayonnaise and salad dressings (reduced sodium). Avocado. Safflower, olive, or canola oils. Natural peanut or almond butter. Other Unsalted popcorn and pretzels. The items listed above may not be a complete list of recommended foods or beverages. Contact your dietitian for more options. WHAT FOODS ARE NOT RECOMMENDED? Grains White bread.   White pasta. White rice. Refined cornbread. Bagels and croissants. Crackers that contain trans fat. Vegetables Creamed or fried vegetables. Vegetables in a cheese sauce. Regular canned vegetables. Regular canned tomato sauce and paste. Regular tomato and vegetable juices. Fruits Dried fruits. Canned fruit in light or heavy syrup. Fruit juice. Meat and Other Protein Products Fatty cuts of meat. Ribs, chicken wings, bacon, sausage, bologna, salami, chitterlings, fatback, hot dogs,  bratwurst, and packaged luncheon meats. Salted nuts and seeds. Canned beans with salt. Dairy Whole or 2% milk, cream, half-and-half, and cream cheese. Whole-fat or sweetened yogurt. Full-fat cheeses or blue cheese. Nondairy creamers and whipped toppings. Processed cheese, cheese spreads, or cheese curds. Condiments Onion and garlic salt, seasoned salt, table salt, and sea salt. Canned and packaged gravies. Worcestershire sauce. Tartar sauce. Barbecue sauce. Teriyaki sauce. Soy sauce, including reduced sodium. Steak sauce. Fish sauce. Oyster sauce. Cocktail sauce. Horseradish. Ketchup and mustard. Meat flavorings and tenderizers. Bouillon cubes. Hot sauce. Tabasco sauce. Marinades. Taco seasonings. Relishes. Fats and Oils Butter, stick margarine, lard, shortening, ghee, and bacon fat. Coconut, palm kernel, or palm oils. Regular salad dressings. Other Pickles and olives. Salted popcorn and pretzels. The items listed above may not be a complete list of foods and beverages to avoid. Contact your dietitian for more information. WHERE CAN I FIND MORE INFORMATION? National Heart, Lung, and [employer] Institute: www.nhlbi.nih.gov/health/health-topics/topics/dash/ Document Released: 11/09/2011 Document Revised: 04/06/2014 Document Reviewed: 09/24/2013 ExitCare Patient Information 2015 [employer]. This information is not intended to replace advice given to you by your health care provider. Make sure you discuss any questions you have with your health care provider. Diabetes and Exercise Exercising regularly is important. It is not just about losing weight. It has many health benefits, such as:  Improving your overall fitness, flexibility, and endurance.  Increasing your bone density.  Helping with weight control.  Decreasing your body fat.  Increasing your muscle strength.  Reducing stress and tension.  Improving your overall health. People with diabetes who exercise gain additional benefits  because exercise:  Reduces appetite.  Improves the body's use of blood sugar (glucose).  Helps lower or control blood glucose.  Decreases blood pressure.  Helps control blood lipids (such as cholesterol and triglycerides).  Improves the body's use of the hormone insulin by:  Increasing the body's insulin sensitivity.  Reducing the body's insulin needs.  Decreases the risk for heart disease because exercising:  Lowers cholesterol and triglycerides levels.  Increases the levels of good cholesterol (such as high-density lipoproteins [HDL]) in the body.  Lowers blood glucose levels. YOUR ACTIVITY PLAN  Choose an activity that you enjoy and set realistic goals. Your health care provider or diabetes educator can help you make an activity plan that works for you. Exercise regularly as directed by your health care provider. This includes:  Performing resistance training twice a week such as push-ups, sit-ups, lifting weights, or using resistance bands.  Performing 150 minutes of cardio exercises each week such as walking, running, or playing sports.  Staying active and spending no more than 90 minutes at one time being inactive. Even short bursts of exercise are good for you. Three 10-minute sessions spread throughout the day are just as beneficial as a single 30-minute session. Some exercise ideas include:  Taking the dog for a walk.  Taking the stairs instead of the elevator.  Dancing to your favorite song.  Doing an exercise video.  Doing your favorite exercise with a friend. RECOMMENDATIONS FOR EXERCISING WITH TYPE 1 OR TYPE   2 DIABETES   Check your blood glucose before exercising. If blood glucose levels are greater than 240 mg/dL, check for urine ketones. Do not exercise if ketones are present.  Avoid injecting insulin into areas of the body that are going to be exercised. For example, avoid injecting insulin into:  The arms when playing tennis.  The legs when  jogging.  Keep a record of:  Food intake before and after you exercise.  Expected peak times of insulin action.  Blood glucose levels before and after you exercise.  The type and amount of exercise you have done.  Review your records with your health care provider. Your health care provider will help you to develop guidelines for adjusting food intake and insulin amounts before and after exercising.  If you take insulin or oral hypoglycemic agents, watch for signs and symptoms of hypoglycemia. They include:  Dizziness.  Shaking.  Sweating.  Chills.  Confusion.  Drink plenty of water while you exercise to prevent dehydration or heat stroke. Body water is lost during exercise and must be replaced.  Talk to your health care provider before starting an exercise program to make sure it is safe for you. Remember, almost any type of activity is better than none. Document Released: 02/10/2004 Document Revised: 04/06/2014 Document Reviewed: 04/29/2013 ExitCare Patient Information 2015 [employer]. This information is not intended to replace advice given to you by your health care provider. Make sure you discuss any questions you have with your health care provider. Basic Carbohydrate Counting for Diabetes Mellitus Carbohydrate counting is a method for keeping track of the amount of carbohydrates you eat. Eating carbohydrates naturally increases the level of sugar (glucose) in your blood, so it is important for you to know the amount that is okay for you to have in every meal. Carbohydrate counting helps keep the level of glucose in your blood within normal limits. The amount of carbohydrates allowed is different for every person. A dietitian can help you calculate the amount that is right for you. Once you know the amount of carbohydrates you can have, you can count the carbohydrates in the foods you want to eat. Carbohydrates are found in the following foods:  Grains, such as breads  and cereals.  Dried beans and soy products.  Starchy vegetables, such as potatoes, peas, and corn.  Fruit and fruit juices.  Milk and yogurt.  Sweets and snack foods, such as cake, cookies, candy, chips, soft drinks, and fruit drinks. CARBOHYDRATE COUNTING There are two ways to count the carbohydrates in your food. You can use either of the methods or a combination of both. Reading the "Nutrition Facts" on Packaged Food The "Nutrition Facts" is an area that is included on the labels of almost all packaged food and beverages in the [place]. It includes the serving size of that food or beverage and information about the nutrients in each serving of the food, including the grams (g) of carbohydrate per serving.  Decide the number of servings of this food or beverage that you will be able to eat or drink. Multiply that number of servings by the number of grams of carbohydrate that is listed on the label for that serving. The total will be the amount of carbohydrates you will be having when you eat or drink this food or beverage. Learning Standard Serving Sizes of Food When you eat food that is not packaged or does not include "Nutrition Facts" on the label, you need to measure the servings   in order to count the amount of carbohydrates.A serving of most carbohydrate-rich foods contains about 15 g of carbohydrates. The following list includes serving sizes of carbohydrate-rich foods that provide 15 g ofcarbohydrate per serving:   1 slice of bread (1 oz) or 1 six-inch tortilla.    of a hamburger bun or English muffin.  4-6 crackers.   cup unsweetened dry cereal.    cup hot cereal.   cup rice or pasta.    cup mashed potatoes or  of a large baked potato.  1 cup fresh fruit or one small piece of fruit.    cup canned or frozen fruit or fruit juice.  1 cup milk.   cup plain fat-free yogurt or yogurt sweetened with artificial sweeteners.   cup cooked dried beans or  starchy vegetable, such as peas, corn, or potatoes.  Decide the number of standard-size servings that you will eat. Multiply that number of servings by 15 (the grams of carbohydrates in that serving). For example, if you eat 2 cups of strawberries, you will have eaten 2 servings and 30 g of carbohydrates (2 servings x 15 g = 30 g). For foods such as soups and casseroles, in which more than one food is mixed in, you will need to count the carbohydrates in each food that is included. EXAMPLE OF CARBOHYDRATE COUNTING Sample Dinner  3 oz chicken breast.   cup of brown rice.   cup of corn.  1 cup milk.   1 cup strawberries with sugar-free whipped topping.  Carbohydrate Calculation Step 1: Identify the foods that contain carbohydrates:   Rice.   Corn.   Milk.   Strawberries. Step 2:Calculate the number of servings eaten of each:   2 servings of rice.   1 serving of corn.   1 serving of milk.   1 serving of strawberries. Step 3: Multiply each of those number of servings by 15 g:   2 servings of rice x 15 g = 30 g.   1 serving of corn x 15 g = 15 g.   1 serving of milk x 15 g = 15 g.   1 serving of strawberries x 15 g = 15 g. Step 4: Add together all of the amounts to find the total grams of carbohydrates eaten: 30 g + 15 g + 15 g + 15 g = 75 g. Document Released: 11/20/2005 Document Revised: 04/06/2014 Document Reviewed: 10/17/2013 ExitCare Patient Information 2015 [employer]. This information is not intended to replace advice given to you by your health care provider. Make sure you discuss any questions you have with your health care provider.  

## 2016-02-10 ENCOUNTER — Other Ambulatory Visit: Payer: Self-pay | Admitting: Internal Medicine

## 2016-03-07 ENCOUNTER — Other Ambulatory Visit: Payer: Self-pay | Admitting: Internal Medicine

## 2016-05-11 ENCOUNTER — Other Ambulatory Visit: Payer: Self-pay | Admitting: Internal Medicine

## 2016-09-10 ENCOUNTER — Ambulatory Visit (HOSPITAL_COMMUNITY)
Admission: EM | Admit: 2016-09-10 | Discharge: 2016-09-10 | Disposition: A | Discharge status: Home / Self Care | Payer: Medicare Other | Attending: Internal Medicine | Admitting: Internal Medicine

## 2016-09-10 ENCOUNTER — Encounter (HOSPITAL_COMMUNITY): Payer: Self-pay | Admitting: Family Medicine

## 2016-09-10 DIAGNOSIS — M7052 Other bursitis of knee, left knee: Secondary | ICD-10-CM

## 2016-09-10 MED ORDER — NAPROXEN 375 MG PO TABS: 375.0000 mg | ORAL_TABLET | Freq: Two times a day (BID) | ORAL | 0 refills | Status: AC

## 2016-09-10 MED ORDER — TRAMADOL HCL 50 MG PO TABS: 50.0000 mg | ORAL_TABLET | Freq: Four times a day (QID) | ORAL | 0 refills | Status: DC | PRN

## 2016-09-10 NOTE — ED Provider Notes (Signed)
[facility]    CSN: [id number] Arrival date & time: 09/10/16  1158     History   Chief Complaint Chief Complaint  Patient presents with  . Knee Pain    HPI Andrea Brown is a 64 y.o. female. She presents today with left knee pain. Her left knee bothers her on and off, and has for a long time. She washed her kitchen floor on hands and knees 2 days ago, had a little discomfort yesterday and today says the discomfort in the left knee is almost unbearable. Particularly painful with weightbearing, and with movement of the knee. No prior history of gout, but she does have hypertension and and she does take a fluid pill. No family history of gout. No fever. Feels okay otherwise  HPI  Past Medical History:  Diagnosis Date  . Diabetes mellitus without complication ([place])   . Hyperlipidemia   . Hypertension     Patient Active Problem List   Diagnosis Date Noted  . Dyslipidemia 06/08/2014  . Diabetes ([place]) 03/09/2014  . Essential hypertension, benign 03/09/2014  . Nicotine dependence 08/07/2013  . Other and unspecified hyperlipidemia 08/07/2013  . Type II or unspecified type diabetes mellitus without mention of complication, not stated as uncontrolled 05/07/2013  . CKD (chronic kidney disease) 05/07/2013  . Unspecified essential hypertension 05/07/2013  . Smoker unmotivated to quit 05/07/2013    Past Surgical History:  Procedure Laterality Date  . TOTAL ABDOMINAL HYSTERECTOMY  2000     Home Medications    Prior to Admission medications   Medication Sig Start Date End Date Taking? Authorizing Provider  amLODipine (NORVASC) 10 MG tablet Take 1 tablet (10 mg total) by mouth daily. Must have office visit for refills 05/11/16   Tresa Garter, MD  aspirin 81 MG tablet Take 1 tablet (81 mg total) by mouth daily. 12/08/13   Reyne Dumas, MD  glipiZIDE (GLUCOTROL) 5 MG tablet Take 1 tablet (5 mg total) by mouth 2 (two) times daily before a meal. Must have office visit for  refills 05/11/16   Tresa Garter, MD  hydrochlorothiazide (HYDRODIURIL) 25 MG tablet Take 1 tablet (25 mg total) by mouth daily. Must have office visit for refills 05/11/16   Tresa Garter, MD  lisinopril (PRINIVIL,ZESTRIL) 40 MG tablet Take 1 tablet (40 mg total) by mouth daily. Must have office visit for refills 05/11/16   Tresa Garter, MD  naproxen (NAPROSYN) 375 MG tablet Take 1 tablet (375 mg total) by mouth 2 (two) times daily. 09/10/16 09/24/16  Sherlene Shams, MD  simvastatin (ZOCOR) 10 MG tablet Take 1 tablet (10 mg total) by mouth daily at 6 PM. Must have office visit for refills 02/10/16   Tresa Garter, MD  traMADol (ULTRAM) 50 MG tablet Take 1 tablet (50 mg total) by mouth every 6 (six) hours as needed. 09/10/16   Sherlene Shams, MD    Family History Family History  Problem Relation Age of Onset  . Diabetes Mother   . Hypertension Mother   . Colon cancer Neg Hx   . Esophageal cancer Neg Hx   . Pancreatic cancer Neg Hx   . Rectal cancer Neg Hx   . Stomach cancer Neg Hx     Social History Social History  Substance Use Topics  . Smoking status: Current Every Day Smoker    Packs/day: 0.25    Types: Cigarettes  . Smokeless tobacco: Never Used  . Alcohol use No  Allergies   Review of patient's allergies indicates no known allergies.   Review of Systems Review of Systems  All other systems reviewed and are negative.    Physical Exam Triage Vital Signs ED Triage Vitals  Enc Vitals Group     BP 09/10/16 1243 159/71     Pulse Rate 09/10/16 1243 68     Resp 09/10/16 1243 16     Temp 09/10/16 1243 97.8 F (36.6 C)     Temp Source 09/10/16 1243 Oral     SpO2 09/10/16 1243 99 %     Weight --      Height --      Pain Score 09/10/16 1249 10   Updated Vital Signs BP 159/71 (BP Location: Left Arm)   Pulse 68   Temp 97.8 F (36.6 C) (Oral)   Resp 16   SpO2 99%  Physical Exam  Constitutional: She is oriented to person, place, and time. No  distress.  Alert, nicely groomed  HENT:  Head: Atraumatic.  Eyes:  Conjugate gaze, no eye redness/drainage  Neck: Neck supple.  Cardiovascular: Normal rate.   Pulmonary/Chest: No respiratory distress.  Abdominal: She exhibits no distension.  Musculoskeletal: Normal range of motion.  No leg swelling Subtle warmth of the left knee to palpation compared to the right Subtle fullness/?bursitis inferior and medial to the patella of the left knee compared to the right Excellent range of motion left knee compared to right, symmetric, full including flexion and extension No focal tenderness to palpation  Neurological: She is alert and oriented to person, place, and time.  Skin: Skin is warm and dry.  No cyanosis  Nursing note and vitals reviewed.    UC Treatments / Results   Procedures Procedures (including critical care time)   Final Clinical Impressions(s) / UC Diagnoses   Final diagnoses:  Bursitis of left knee, unspecified bursa   Ice may help with discomfort, 5-10 minutes 2-4 times daily.  Prescription for naproxen (anti inflammatory) should help pain/mild swelling.  Pain could be coming from bursitis (from working on hands/knees recently) or gout/pseudogout.  Recheck or followup with Dr Doreene Burke for further evaluation if not improving in the next several days.  New Prescriptions New Prescriptions   NAPROXEN (NAPROSYN) 375 MG TABLET    Take 1 tablet (375 mg total) by mouth 2 (two) times daily.   TRAMADOL (ULTRAM) 50 MG TABLET    Take 1 tablet (50 mg total) by mouth every 6 (six) hours as needed.     Sherlene Shams, MD 09/14/16 [phone]

## 2016-09-10 NOTE — ED Triage Notes (Signed)
Pt here for left knee pain that has been going for a while but worsening since yesterday. Denies any injury. Knee isn't swollen.

## 2016-09-10 NOTE — Discharge Instructions (Addendum)
Ice may help with discomfort, 5-10 minutes 2-4 times daily.  Prescription for naproxen (anti inflammatory) should help pain/mild swelling.  Pain could be coming from bursitis (from working on hands/knees recently) or gout/pseudogout.  Recheck or followup with Dr Doreene Burke for further evaluation if not improving in the next several days.

## 2016-09-25 ENCOUNTER — Telehealth: Payer: Self-pay | Admitting: Internal Medicine

## 2016-09-25 MED ORDER — GLIPIZIDE 5 MG PO TABS: 5.0000 mg | ORAL_TABLET | Freq: Two times a day (BID) | ORAL | 0 refills | Status: DC

## 2016-09-25 MED ORDER — HYDROCHLOROTHIAZIDE 25 MG PO TABS: 25.0000 mg | ORAL_TABLET | Freq: Every day | ORAL | 0 refills | Status: DC

## 2016-09-25 MED ORDER — SIMVASTATIN 10 MG PO TABS: 10.0000 mg | ORAL_TABLET | Freq: Every day | ORAL | 0 refills | Status: DC

## 2016-09-25 MED ORDER — LISINOPRIL 40 MG PO TABS: 40.0000 mg | ORAL_TABLET | Freq: Every day | ORAL | 0 refills | Status: DC

## 2016-09-25 MED ORDER — AMLODIPINE BESYLATE 10 MG PO TABS: 10.0000 mg | ORAL_TABLET | Freq: Every day | ORAL | 0 refills | Status: DC

## 2016-09-25 NOTE — Telephone Encounter (Signed)
Refilled medications x 1 week

## 2016-09-25 NOTE — Telephone Encounter (Signed)
Pt. Called requesting a refill on all her current current medication.  Pt. Has scheduled an appointment for next Thursday but does not Have enough medication. Pt. Would like her medication sent to the  [facility] pharmacy and would like enough to last her until she comes In for her appointment. Please f/u

## 2016-10-05 ENCOUNTER — Ambulatory Visit: Payer: Medicare Other | Attending: Internal Medicine | Admitting: Internal Medicine

## 2016-10-05 ENCOUNTER — Encounter: Payer: Self-pay | Admitting: Internal Medicine

## 2016-10-05 VITALS — BP 121/63 | HR 82 | Temp 98.1°F | Resp 18 | Ht 61.0 in | Wt 139.8 lb

## 2016-10-05 DIAGNOSIS — Z7982 Long term (current) use of aspirin: Secondary | ICD-10-CM | POA: Diagnosis not present

## 2016-10-05 DIAGNOSIS — I10 Essential (primary) hypertension: Secondary | ICD-10-CM | POA: Insufficient documentation

## 2016-10-05 DIAGNOSIS — Z1231 Encounter for screening mammogram for malignant neoplasm of breast: Secondary | ICD-10-CM | POA: Diagnosis not present

## 2016-10-05 DIAGNOSIS — E785 Hyperlipidemia, unspecified: Secondary | ICD-10-CM | POA: Diagnosis not present

## 2016-10-05 DIAGNOSIS — Z79899 Other long term (current) drug therapy: Secondary | ICD-10-CM | POA: Insufficient documentation

## 2016-10-05 DIAGNOSIS — Z7984 Long term (current) use of oral hypoglycemic drugs: Secondary | ICD-10-CM | POA: Diagnosis not present

## 2016-10-05 DIAGNOSIS — E119 Type 2 diabetes mellitus without complications: Secondary | ICD-10-CM | POA: Diagnosis not present

## 2016-10-05 DIAGNOSIS — Z1239 Encounter for other screening for malignant neoplasm of breast: Secondary | ICD-10-CM | POA: Insufficient documentation

## 2016-10-05 DIAGNOSIS — F17210 Nicotine dependence, cigarettes, uncomplicated: Secondary | ICD-10-CM | POA: Diagnosis not present

## 2016-10-05 LAB — LIPID PANEL
CHOL/HDL RATIO: 3.7 ratio (ref <=5.0)
Cholesterol: 138 mg/dL (ref 125–200)
HDL: 37 mg/dL — AB (ref >=46)
LDL CALC: 82 mg/dL (ref <130)
Triglycerides: 97 mg/dL (ref <150)
VLDL: 19 mg/dL (ref <30)

## 2016-10-05 LAB — CBC WITH DIFFERENTIAL/PLATELET
BASOS ABS: 0 {cells}/uL (ref 0–200)
Basophils Relative: 0 %
EOS PCT: 2 %
Eosinophils Absolute: 110 cells/uL (ref 15–500)
HCT: 34.2 % — ABNORMAL LOW (ref 35.0–45.0)
Hemoglobin: 11.2 g/dL — ABNORMAL LOW (ref 11.7–15.5)
Lymphocytes Relative: 40 %
Lymphs Abs: 2200 cells/uL (ref 850–3900)
MCH: 28.2 pg (ref 27.0–33.0)
MCHC: 32.7 g/dL (ref 32.0–36.0)
MCV: 86.1 fL (ref 80.0–100.0)
MONOS PCT: 5 %
MPV: 11.1 fL (ref 7.5–12.5)
Monocytes Absolute: 275 cells/uL (ref 200–950)
NEUTROS PCT: 53 %
Neutro Abs: 2915 cells/uL (ref 1500–7800)
PLATELETS: 255 10*3/uL (ref 140–400)
RBC: 3.97 MIL/uL (ref 3.80–5.10)
RDW: 15.1 % — AB (ref 11.0–15.0)
WBC: 5.5 10*3/uL (ref 3.8–10.8)

## 2016-10-05 LAB — COMPLETE METABOLIC PANEL WITH GFR
ALK PHOS: 54 U/L (ref 33–130)
ALT: 8 U/L (ref 6–29)
AST: 14 U/L (ref 10–35)
Albumin: 4.3 g/dL (ref 3.6–5.1)
BUN: 50 mg/dL — ABNORMAL HIGH (ref 7–25)
CO2: 21 mmol/L (ref 20–31)
Calcium: 9.2 mg/dL (ref 8.6–10.4)
Chloride: 110 mmol/L (ref 98–110)
Creat: 2.31 mg/dL — ABNORMAL HIGH (ref 0.50–0.99)
GFR, EST NON AFRICAN AMERICAN: 21 mL/min — AB (ref >=60)
GFR, Est African American: 24 mL/min — ABNORMAL LOW (ref >=60)
GLUCOSE: 112 mg/dL — AB (ref 65–99)
POTASSIUM: 5.4 mmol/L — AB (ref 3.5–5.3)
SODIUM: 141 mmol/L (ref 135–146)
Total Bilirubin: 0.4 mg/dL (ref 0.2–1.2)
Total Protein: 7.4 g/dL (ref 6.1–8.1)

## 2016-10-05 LAB — GLUCOSE, POCT (MANUAL RESULT ENTRY): POC Glucose: 99 mg/dl (ref 70–99)

## 2016-10-05 LAB — POCT GLYCOSYLATED HEMOGLOBIN (HGB A1C): Hemoglobin A1C: 5.9

## 2016-10-05 MED ORDER — SIMVASTATIN 10 MG PO TABS: 10.0000 mg | ORAL_TABLET | Freq: Every day | ORAL | 3 refills | Status: DC

## 2016-10-05 MED ORDER — GLIPIZIDE 5 MG PO TABS: 5.0000 mg | ORAL_TABLET | Freq: Two times a day (BID) | ORAL | 3 refills | Status: DC

## 2016-10-05 MED ORDER — LISINOPRIL 40 MG PO TABS: 40.0000 mg | ORAL_TABLET | Freq: Every day | ORAL | 3 refills | Status: DC

## 2016-10-05 MED ORDER — AMLODIPINE BESYLATE 10 MG PO TABS: 10.0000 mg | ORAL_TABLET | Freq: Every day | ORAL | 3 refills | Status: DC

## 2016-10-05 MED ORDER — HYDROCHLOROTHIAZIDE 25 MG PO TABS: 25.0000 mg | ORAL_TABLET | Freq: Every day | ORAL | 3 refills | Status: DC

## 2016-10-05 NOTE — Progress Notes (Signed)
Andrea Brown, is a 66 y.o. female  QMG:[id number]  [id number]  DOB - Mar 15, 1951  Chief Complaint  Patient presents with  . Diabetes  . Hypertension      Subjective:   Andrea Brown is a 65 y.o. female with history of hypertension, diabetes mellitus without complication, and hyperlipidemia here today for a follow up visit and medication refills. She banged her left shin on a table at home last month, went to the ED, was treated with NSAIDs and she feels better. She has no new complaint today. BP and Blood Sugar are controlled, HbA1C is down to 5.9% on current regimen, no hypoglycemic episode. Patient continues to smoke cigarettes about half a pack per day. She drinks alcohol occasionally. Patient is due for mammogram. Patient has No headache, No chest pain, No abdominal pain - No Nausea, No new weakness tingling or numbness, No Cough - SOB.  Problem  Breast Cancer Screening   ALLERGIES: No Known Allergies  PAST MEDICAL HISTORY: Past Medical History:  Diagnosis Date  . Diabetes mellitus without complication ([place])   . Hyperlipidemia   . Hypertension    MEDICATIONS AT HOME: Prior to Admission medications   Medication Sig Start Date End Date Taking? Authorizing Provider  amLODipine (NORVASC) 10 MG tablet Take 1 tablet (10 mg total) by mouth daily. 10/05/16  Yes Tresa Garter, MD  aspirin 81 MG tablet Take 1 tablet (81 mg total) by mouth daily. 12/08/13  Yes Reyne Dumas, MD  glipiZIDE (GLUCOTROL) 5 MG tablet Take 1 tablet (5 mg total) by mouth 2 (two) times daily before a meal. 10/05/16  Yes Jakolby Sedivy E Doreene Burke, MD  hydrochlorothiazide (HYDRODIURIL) 25 MG tablet Take 1 tablet (25 mg total) by mouth daily. 10/05/16  Yes Tresa Garter, MD  lisinopril (PRINIVIL,ZESTRIL) 40 MG tablet Take 1 tablet (40 mg total) by mouth daily. 10/05/16  Yes Tresa Garter, MD  simvastatin (ZOCOR) 10 MG tablet Take 1 tablet (10 mg total) by mouth daily at 6 PM. 10/05/16  Yes Zaila Crew E Doreene Burke,  MD  traMADol (ULTRAM) 50 MG tablet Take 1 tablet (50 mg total) by mouth every 6 (six) hours as needed. 09/10/16  Yes Sherlene Shams, MD   Objective:   Vitals:   10/05/16 0959  BP: 121/63  Pulse: 82  Resp: 18  Temp: 98.1 F (36.7 C)  TempSrc: Oral  SpO2: 100%  Weight: 139 lb 12.8 oz (63.4 kg)  Height: 5\' 1"  (1.549 m)   Exam General appearance : Awake, alert, not in any distress. Speech Clear. Not toxic looking HEENT: Atraumatic and Normocephalic, pupils equally reactive to light and accomodation Neck: Supple, no JVD. No cervical lymphadenopathy.  Chest: Good air entry bilaterally, no added sounds  CVS: S1 S2 regular, no murmurs.  Abdomen: Bowel sounds present, Non tender and not distended with no gaurding, rigidity or rebound. Extremities: B/L Lower Ext shows no edema, both legs are warm to touch Neurology: Awake alert, and oriented X 3, CN II-XII intact, Non focal Skin: No Rash  Data Review Lab Results  Component Value Date   HGBA1C 5.9 10/05/2016   HGBA1C 6.0 01/14/2015   HGBA1C 6.3 09/08/2014   Assessment & Plan   1. Type 2 diabetes mellitus without complication, without long-term current use of insulin (HCC)  - POCT A1C - Glucose (CBG) - Microalbumin/Creatinine Ratio, Urine - glipiZIDE (GLUCOTROL) 5 MG tablet; Take 1 tablet (5 mg total) by mouth 2 (two) times daily before a meal.  Dispense: 180 tablet;  Refill: 3 - CBC with Differential/Platelet - Urinalysis, Complete  Aim for 30 minutes of exercise most days. Rethink what you drink. Water is great! Aim for 2-3 Carb Choices per meal (30-45 grams) +/- 1 either way  Aim for 0-15 Carbs per snack if hungry  Include protein in moderation with your meals and snacks  Consider reading food labels for Total Carbohydrate and Fat Grams of foods  Consider checking BG at alternate times per day  Continue taking medication as directed Be mindful about how much sugar you are adding to beverages and other foods. Fruit Punch  - find one with no sugar  Measure and decrease portions of carbohydrate foods  Make your plate and don't go back for seconds  2. Essential hypertension, benign  - lisinopril (PRINIVIL,ZESTRIL) 40 MG tablet; Take 1 tablet (40 mg total) by mouth daily.  Dispense: 90 tablet; Refill: 3 - hydrochlorothiazide (HYDRODIURIL) 25 MG tablet; Take 1 tablet (25 mg total) by mouth daily.  Dispense: 90 tablet; Refill: 3 - amLODipine (NORVASC) 10 MG tablet; Take 1 tablet (10 mg total) by mouth daily.  Dispense: 90 tablet; Refill: 3 - COMPLETE METABOLIC PANEL WITH GFR - Urinalysis, Complete  We have discussed target BP range and blood pressure goal. I have advised patient to check BP regularly and to call us back or report to clinic if the numbers are consistently higher than 140/90. We discussed the importance of compliance with medical therapy and DASH diet recommended, consequences of uncontrolled hypertension discussed.  - continue current BP medications  3. Dyslipidemia  - simvastatin (ZOCOR) 10 MG tablet; Take 1 tablet (10 mg total) by mouth daily at 6 PM.  Dispense: 90 tablet; Refill: 3 - Lipid panel  We have discussed target BP range and blood pressure goal. I have advised patient to check BP regularly and to call us back or report to clinic if the numbers are consistently higher than 140/90. We discussed the importance of compliance with medical therapy and DASH diet recommended, consequences of uncontrolled hypertension discussed.  - continue current BP medications  4. Breast cancer screening  - MM Digital Screening; Future  Patient have been counseled extensively about nutrition and exercise. Other issues discussed during this visit include: low cholesterol diet, weight control and daily exercise, foot care, annual eye examinations at Ophthalmology, importance of adherence with medications and regular follow-up. We also discussed long term complications of uncontrolled diabetes and hypertension.    Return in about 6 months (around 04/04/2017) for Routine Follow Up, Follow up HTN, Hemoglobin A1C and Follow up, DM.  The patient was given clear instructions to go to ER or return to medical center if symptoms don't improve, worsen or new problems develop. The patient verbalized understanding. The patient was told to call to get lab results if they haven't heard anything in the next week.   This note has been created with Surveyor, quantity. Any transcriptional errors are unintentional.    Angelica Chessman, MD, [place], Karilyn Cota, [place] [facility] [place], [place]   10/05/2016, 10:49 AM

## 2016-10-05 NOTE — Progress Notes (Signed)
Patient is here for FU A1C DM  Patient complains of left chin pain being present after banging it on a table at home. Pain is tender and scaled at a 3.  Patient declined flu vaccine today.  Patient has taken medication today. Patient has not eaten today.

## 2016-10-05 NOTE — Patient Instructions (Addendum)
Hypertension Hypertension, commonly called high blood pressure, is when the force of blood pumping through your arteries is too strong. Your arteries are the blood vessels that carry blood from your heart throughout your body. A blood pressure reading consists of a higher number over a lower number, such as 110/72. The higher number (systolic) is the pressure inside your arteries when your heart pumps. The lower number (diastolic) is the pressure inside your arteries when your heart relaxes. Ideally you want your blood pressure below 120/80. Hypertension forces your heart to work harder to pump blood. Your arteries may become narrow or stiff. Having untreated or uncontrolled hypertension can cause heart attack, stroke, kidney disease, and other problems. RISK FACTORS Some risk factors for high blood pressure are controllable. Others are not.  Risk factors you cannot control include:   Race. You may be at higher risk if you are African American.  Age. Risk increases with age.  Gender. Men are at higher risk than women before age 45 years. After age 65, women are at higher risk than men. Risk factors you can control include:  Not getting enough exercise or physical activity.  Being overweight.  Getting too much fat, sugar, calories, or salt in your diet.  Drinking too much alcohol. SIGNS AND SYMPTOMS Hypertension does not usually cause signs or symptoms. Extremely high blood pressure (hypertensive crisis) may cause headache, anxiety, shortness of breath, and nosebleed. DIAGNOSIS To check if you have hypertension, your health care provider will measure your blood pressure while you are seated, with your arm held at the level of your heart. It should be measured at least twice using the same arm. Certain conditions can cause a difference in blood pressure between your right and left arms. A blood pressure reading that is higher than normal on one occasion does not mean that you need treatment. If  it is not clear whether you have high blood pressure, you may be asked to return on a different day to have your blood pressure checked again. Or, you may be asked to monitor your blood pressure at home for 1 or more weeks. TREATMENT Treating high blood pressure includes making lifestyle changes and possibly taking medicine. Living a healthy lifestyle can help lower high blood pressure. You may need to change some of your habits. Lifestyle changes may include:  Following the DASH diet. This diet is high in fruits, vegetables, and whole grains. It is low in salt, red meat, and added sugars.  Keep your sodium intake below 2,300 mg per day.  Getting at least 30-45 minutes of aerobic exercise at least 4 times per week.  Losing weight if necessary.  Not smoking.  Limiting alcoholic beverages.  Learning ways to reduce stress. Your health care provider may prescribe medicine if lifestyle changes are not enough to get your blood pressure under control, and if one of the following is true:  You are 18-59 years of age and your systolic blood pressure is above 140.  You are 60 years of age or older, and your systolic blood pressure is above 150.  Your diastolic blood pressure is above 90.  You have diabetes, and your systolic blood pressure is over 140 or your diastolic blood pressure is over 90.  You have kidney disease and your blood pressure is above 140/90.  You have heart disease and your blood pressure is above 140/90. Your personal target blood pressure may vary depending on your medical conditions, your age, and other factors. HOME CARE INSTRUCTIONS    Have your blood pressure rechecked as directed by your health care provider.   Take medicines only as directed by your health care provider. Follow the directions carefully. Blood pressure medicines must be taken as prescribed. The medicine does not work as well when you skip doses. Skipping doses also puts you at risk for  problems.  Do not smoke.   Monitor your blood pressure at home as directed by your health care provider. SEEK MEDICAL CARE IF:   You think you are having a reaction to medicines taken.  You have recurrent headaches or feel dizzy.  You have swelling in your ankles.  You have trouble with your vision. SEEK IMMEDIATE MEDICAL CARE IF:  You develop a severe headache or confusion.  You have unusual weakness, numbness, or feel faint.  You have severe chest or abdominal pain.  You vomit repeatedly.  You have trouble breathing. MAKE SURE YOU:   Understand these instructions.  Will watch your condition.  Will get help right away if you are not doing well or get worse.   This information is not intended to replace advice given to you by your health care provider. Make sure you discuss any questions you have with your health care provider.   Document Released: 11/20/2005 Document Revised: 04/06/2015 Document Reviewed: 09/12/2013 Elsevier Interactive Patient Education 2016 [place]. Diabetes and Foot Care Diabetes may cause you to have problems because of poor blood supply (circulation) to your feet and legs. This may cause the skin on your feet to become thinner, break easier, and heal more slowly. Your skin may become dry, and the skin may peel and crack. You may also have nerve damage in your legs and feet causing decreased feeling in them. You may not notice minor injuries to your feet that could lead to infections or more serious problems. Taking care of your feet is one of the most important things you can do for yourself.  HOME CARE INSTRUCTIONS  Wear shoes at all times, even in the house. Do not go barefoot. Bare feet are easily injured.  Check your feet daily for blisters, cuts, and redness. If you cannot see the bottom of your feet, use a mirror or ask someone for help.  Wash your feet with warm water (do not use hot water) and mild soap. Then pat your feet and the  areas between your toes until they are completely dry. Do not soak your feet as this can dry your skin.  Apply a moisturizing lotion or petroleum jelly (that does not contain alcohol and is unscented) to the skin on your feet and to dry, brittle toenails. Do not apply lotion between your toes.  Trim your toenails straight across. Do not dig under them or around the cuticle. File the edges of your nails with an emery board or nail file.  Do not cut corns or calluses or try to remove them with medicine.  Wear clean socks or stockings every day. Make sure they are not too tight. Do not wear knee-high stockings since they may decrease blood flow to your legs.  Wear shoes that fit properly and have enough cushioning. To break in new shoes, wear them for just a few hours a day. This prevents you from injuring your feet. Always look in your shoes before you put them on to be sure there are no objects inside.  Do not cross your legs. This may decrease the blood flow to your feet.  If you find a minor scrape,  cut, or break in the skin on your feet, keep it and the skin around it clean and dry. These areas may be cleansed with mild soap and water. Do not cleanse the area with peroxide, alcohol, or iodine.  When you remove an adhesive bandage, be sure not to damage the skin around it.  If you have a wound, look at it several times a day to make sure it is healing.  Do not use heating pads or hot water bottles. They may burn your skin. If you have lost feeling in your feet or legs, you may not know it is happening until it is too late.  Make sure your health care provider performs a complete foot exam at least annually or more often if you have foot problems. Report any cuts, sores, or bruises to your health care provider immediately. SEEK MEDICAL CARE IF:   You have an injury that is not healing.  You have cuts or breaks in the skin.  You have an ingrown nail.  You notice redness on your legs or  feet.  You feel burning or tingling in your legs or feet.  You have pain or cramps in your legs and feet.  Your legs or feet are numb.  Your feet always feel cold. SEEK IMMEDIATE MEDICAL CARE IF:   There is increasing redness, swelling, or pain in or around a wound.  There is a red line that goes up your leg.  Pus is coming from a wound.  You develop a fever or as directed by your health care provider.  You notice a bad smell coming from an ulcer or wound.   This information is not intended to replace advice given to you by your health care provider. Make sure you discuss any questions you have with your health care provider.   Document Released: 11/17/2000 Document Revised: 07/23/2013 Document Reviewed: 04/29/2013 Elsevier Interactive Patient Education [employer].

## 2016-10-06 LAB — URINALYSIS, COMPLETE
Bilirubin Urine: NEGATIVE
CASTS: NONE SEEN [LPF]
CRYSTALS: NONE SEEN [HPF]
Glucose, UA: NEGATIVE
KETONES UR: NEGATIVE
Nitrite: NEGATIVE
SPECIFIC GRAVITY, URINE: 1.012 (ref 1.001–1.035)
Yeast: NONE SEEN [HPF]
pH: 8.5 — ABNORMAL HIGH (ref 5.0–8.0)

## 2016-10-06 LAB — MICROALBUMIN / CREATININE URINE RATIO
Creatinine, Urine: 116 mg/dL (ref 20–320)
MICROALB UR: 1.6 mg/dL
MICROALB/CREAT RATIO: 14 ug/mg{creat} (ref <30)

## 2016-10-10 ENCOUNTER — Ambulatory Visit
Admission: RE | Admit: 2016-10-10 | Discharge: 2016-10-10 | Disposition: A | Discharge status: Home / Self Care | Payer: Medicare Other | Source: Ambulatory Visit | Attending: Internal Medicine | Admitting: Internal Medicine

## 2016-10-10 DIAGNOSIS — Z1239 Encounter for other screening for malignant neoplasm of breast: Secondary | ICD-10-CM

## 2016-10-10 DIAGNOSIS — Z1231 Encounter for screening mammogram for malignant neoplasm of breast: Secondary | ICD-10-CM | POA: Diagnosis not present

## 2016-10-11 ENCOUNTER — Other Ambulatory Visit: Payer: Self-pay | Admitting: Internal Medicine

## 2016-10-11 ENCOUNTER — Telehealth: Payer: Self-pay | Admitting: *Deleted

## 2016-10-11 DIAGNOSIS — N184 Chronic kidney disease, stage 4 (severe): Secondary | ICD-10-CM

## 2016-10-11 DIAGNOSIS — N342 Other urethritis: Secondary | ICD-10-CM

## 2016-10-11 MED ORDER — SULFAMETHOXAZOLE-TRIMETHOPRIM 400-80 MG PO TABS: 1.0000 | ORAL_TABLET | Freq: Two times a day (BID) | ORAL | 0 refills | Status: AC

## 2016-10-11 NOTE — Telephone Encounter (Signed)
-----   Message from Tresa Garter, MD sent at 10/11/2016 12:16 PM EST ----- Kidney function has worsened, needs kidney specialist referral. Evidence of mild Urinary Tract Infection, antibiotic called ti the pharmacy for pick up. Cholesterol is normal, hemoglobin is stable.

## 2016-10-11 NOTE — Telephone Encounter (Signed)
Patient verified DOB Patient is aware of kidney function worsening. Patient is aware of referral being placed to nephrology and patient will receive a call regarding the appointment time and date. Patient is aware of cholesterol being normal and hemoglobin being stable. Patient advised to avoid alcohol and OTC Ibuprofen and to flush her kidneys with water. Patient expressed her understanding and had no further questions at this time. Patient is also aware of mild UTI being present and Bactrim being ordered through patients mail service.

## 2016-10-13 ENCOUNTER — Telehealth: Payer: Self-pay | Admitting: *Deleted

## 2016-10-13 NOTE — Telephone Encounter (Signed)
-----   Message from Tresa Garter, MD sent at 10/12/2016  9:38 AM EST ----- Please inform patient that her screening mammogram shows no evidence of malignancy. Recommend screening mammogram in one year

## 2016-10-13 NOTE — Telephone Encounter (Signed)
Patient verified DOB Patient is aware of mammogram showing no evidence of malignancy and a recommended screening be completed in a year.  No further questions at this time.

## 2016-10-31 ENCOUNTER — Other Ambulatory Visit: Payer: Self-pay | Admitting: Nephrology

## 2016-10-31 DIAGNOSIS — N184 Chronic kidney disease, stage 4 (severe): Secondary | ICD-10-CM

## 2016-10-31 DIAGNOSIS — E1129 Type 2 diabetes mellitus with other diabetic kidney complication: Secondary | ICD-10-CM | POA: Diagnosis not present

## 2016-10-31 DIAGNOSIS — F17200 Nicotine dependence, unspecified, uncomplicated: Secondary | ICD-10-CM | POA: Diagnosis not present

## 2016-10-31 DIAGNOSIS — I129 Hypertensive chronic kidney disease with stage 1 through stage 4 chronic kidney disease, or unspecified chronic kidney disease: Secondary | ICD-10-CM | POA: Diagnosis not present

## 2016-11-07 ENCOUNTER — Ambulatory Visit
Admission: RE | Admit: 2016-11-07 | Discharge: 2016-11-07 | Disposition: A | Discharge status: Home / Self Care | Payer: Medicare Other | Source: Ambulatory Visit | Attending: Nephrology | Admitting: Nephrology

## 2016-11-07 DIAGNOSIS — N189 Chronic kidney disease, unspecified: Secondary | ICD-10-CM | POA: Diagnosis not present

## 2016-11-07 DIAGNOSIS — N184 Chronic kidney disease, stage 4 (severe): Secondary | ICD-10-CM

## 2017-05-04 DIAGNOSIS — F17200 Nicotine dependence, unspecified, uncomplicated: Secondary | ICD-10-CM | POA: Diagnosis not present

## 2017-05-04 DIAGNOSIS — E1129 Type 2 diabetes mellitus with other diabetic kidney complication: Secondary | ICD-10-CM | POA: Diagnosis not present

## 2017-05-04 DIAGNOSIS — N184 Chronic kidney disease, stage 4 (severe): Secondary | ICD-10-CM | POA: Diagnosis not present

## 2017-05-04 DIAGNOSIS — I129 Hypertensive chronic kidney disease with stage 1 through stage 4 chronic kidney disease, or unspecified chronic kidney disease: Secondary | ICD-10-CM | POA: Diagnosis not present

## 2017-10-01 ENCOUNTER — Telehealth: Payer: Self-pay | Admitting: Internal Medicine

## 2017-10-01 DIAGNOSIS — E119 Type 2 diabetes mellitus without complications: Secondary | ICD-10-CM

## 2017-10-01 DIAGNOSIS — E785 Hyperlipidemia, unspecified: Secondary | ICD-10-CM

## 2017-10-01 DIAGNOSIS — I10 Essential (primary) hypertension: Secondary | ICD-10-CM

## 2017-10-01 MED ORDER — GLIPIZIDE 5 MG PO TABS: 5.0000 mg | ORAL_TABLET | Freq: Two times a day (BID) | ORAL | 0 refills | Status: DC

## 2017-10-01 MED ORDER — LISINOPRIL 40 MG PO TABS: 40.0000 mg | ORAL_TABLET | Freq: Every day | ORAL | 0 refills | Status: DC

## 2017-10-01 MED ORDER — SIMVASTATIN 10 MG PO TABS: 10.0000 mg | ORAL_TABLET | Freq: Every day | ORAL | 0 refills | Status: DC

## 2017-10-01 MED ORDER — AMLODIPINE BESYLATE 10 MG PO TABS: 10.0000 mg | ORAL_TABLET | Freq: Every day | ORAL | 0 refills | Status: DC

## 2017-10-01 NOTE — Telephone Encounter (Signed)
Pt called to request refill  simvastatin (ZOCOR) 10 MG tablet  glipiZIDE (GLUCOTROL) 5 MG tablet  lisinopril (PRINIVIL,ZESTRIL) 40 MG tablet amLODipine (NORVASC) 10 MG tablet  Pt has an appointment for 10/17/2017 she needs a refill until then please follow up.

## 2017-10-01 NOTE — Telephone Encounter (Signed)
Refilled to last until appt. Must have OV for further refills.

## 2017-10-05 ENCOUNTER — Other Ambulatory Visit: Payer: Self-pay | Admitting: Internal Medicine

## 2017-10-16 ENCOUNTER — Other Ambulatory Visit: Payer: Self-pay | Admitting: Internal Medicine

## 2017-10-16 DIAGNOSIS — Z1231 Encounter for screening mammogram for malignant neoplasm of breast: Secondary | ICD-10-CM

## 2017-10-17 ENCOUNTER — Encounter: Payer: Self-pay | Admitting: Internal Medicine

## 2017-10-17 ENCOUNTER — Ambulatory Visit: Payer: Medicare Other | Attending: Internal Medicine | Admitting: Internal Medicine

## 2017-10-17 ENCOUNTER — Ambulatory Visit
Admission: RE | Admit: 2017-10-17 | Discharge: 2017-10-17 | Disposition: A | Discharge status: Home / Self Care | Payer: Medicare Other | Source: Ambulatory Visit | Attending: Internal Medicine | Admitting: Internal Medicine

## 2017-10-17 VITALS — BP 156/67 | HR 71 | Temp 97.8°F | Resp 18 | Ht 61.0 in | Wt 141.0 lb

## 2017-10-17 DIAGNOSIS — I10 Essential (primary) hypertension: Secondary | ICD-10-CM

## 2017-10-17 DIAGNOSIS — Z7984 Long term (current) use of oral hypoglycemic drugs: Secondary | ICD-10-CM | POA: Insufficient documentation

## 2017-10-17 DIAGNOSIS — Z1231 Encounter for screening mammogram for malignant neoplasm of breast: Secondary | ICD-10-CM | POA: Diagnosis not present

## 2017-10-17 DIAGNOSIS — Z79899 Other long term (current) drug therapy: Secondary | ICD-10-CM | POA: Diagnosis not present

## 2017-10-17 DIAGNOSIS — Z7982 Long term (current) use of aspirin: Secondary | ICD-10-CM | POA: Insufficient documentation

## 2017-10-17 DIAGNOSIS — E785 Hyperlipidemia, unspecified: Secondary | ICD-10-CM

## 2017-10-17 DIAGNOSIS — E1121 Type 2 diabetes mellitus with diabetic nephropathy: Secondary | ICD-10-CM

## 2017-10-17 LAB — POCT GLYCOSYLATED HEMOGLOBIN (HGB A1C): Hemoglobin A1C: 5.6

## 2017-10-17 LAB — GLUCOSE, POCT (MANUAL RESULT ENTRY): POC Glucose: 111 mg/dl — AB (ref 70–99)

## 2017-10-17 MED ORDER — GLIPIZIDE 5 MG PO TABS: 5.0000 mg | ORAL_TABLET | Freq: Two times a day (BID) | ORAL | 3 refills | Status: DC

## 2017-10-17 MED ORDER — SIMVASTATIN 10 MG PO TABS: 10.0000 mg | ORAL_TABLET | Freq: Every day | ORAL | 3 refills | Status: DC

## 2017-10-17 MED ORDER — ASPIRIN 81 MG PO TABS: 81.0000 mg | ORAL_TABLET | Freq: Every day | ORAL | 5 refills | Status: DC

## 2017-10-17 MED ORDER — AMLODIPINE BESYLATE 10 MG PO TABS: 10.0000 mg | ORAL_TABLET | Freq: Every day | ORAL | 3 refills | Status: DC

## 2017-10-17 MED ORDER — HYDROCHLOROTHIAZIDE 25 MG PO TABS: 25.0000 mg | ORAL_TABLET | Freq: Every day | ORAL | 3 refills | Status: DC

## 2017-10-17 MED ORDER — LISINOPRIL 40 MG PO TABS: 40.0000 mg | ORAL_TABLET | Freq: Every day | ORAL | 3 refills | Status: DC

## 2017-10-17 NOTE — Progress Notes (Signed)
Andrea Brown, is a 66 y.o. female  [id number]:[id number]  [id number]  DOB - 15-May-1951  Chief Complaint  Patient presents with  . Follow-up      Subjective:   Andrea Brown is a 66 y.o. female with history of hypertension, diabetes mellitus with Diabetic Nephropathy and hyperlipidemia who presents here today for a follow up visit and medication refills. No new complaint today. Has not had medication since last week, ran out of all of her medciations. She went for her mammogram this morning.  When she has her medications, she is adherent. She does not smoke cigarette. Does not work. Lives at home with family, no domestic issues of concern. She denies any suicidal ideations or thoughts. Patient has No headache, No chest pain, No abdominal pain - No Nausea, No new weakness tingling or numbness, No Cough - SOB.  No problems updated.  ALLERGIES: No Known Allergies  PAST MEDICAL HISTORY: Past Medical History:  Diagnosis Date  . Diabetes mellitus without complication ([place])   . Hyperlipidemia   . Hypertension     MEDICATIONS AT HOME: Prior to Admission medications   Medication Sig Start Date End Date Taking? Authorizing Provider  amLODipine (NORVASC) 10 MG tablet Take 1 tablet (10 mg total) daily by mouth. 10/17/17  Yes Tresa Garter, MD  aspirin 81 MG tablet Take 1 tablet (81 mg total) daily by mouth. 10/17/17  Yes Ladavia Lindenbaum E, MD  glipiZIDE (GLUCOTROL) 5 MG tablet Take 1 tablet (5 mg total) 2 (two) times daily before a meal by mouth. 10/17/17  Yes Sherie Dobrowolski E, MD  hydrochlorothiazide (HYDRODIURIL) 25 MG tablet Take 1 tablet (25 mg total) daily by mouth. 10/17/17  Yes Ashlan Dignan E, MD  lisinopril (PRINIVIL,ZESTRIL) 40 MG tablet Take 1 tablet (40 mg total) daily by mouth. 10/17/17  Yes Pranit Owensby E, MD  simvastatin (ZOCOR) 10 MG tablet Take 1 tablet (10 mg total) daily at 6 PM by mouth. 10/17/17  Yes Alfretta Pinch E, MD  traMADol (ULTRAM) 50 MG  tablet Take 1 tablet (50 mg total) by mouth every 6 (six) hours as needed. 09/10/16  Yes Sherlene Shams, MD    Objective:   Vitals:   10/17/17 0852  BP: (!) 156/67  Pulse: 71  Resp: 18  Temp: 97.8 F (36.6 C)  TempSrc: Oral  SpO2: 100%  Weight: 141 lb (64 kg)  Height: _0  (1.549 m)   Exam General appearance : Awake, alert, not in any distress. Speech Clear. Not toxic looking, poor dentition HEENT: Atraumatic and Normocephalic, pupils equally reactive to light and accomodation Neck: Supple, no JVD. No cervical lymphadenopathy.  Chest: Good air entry bilaterally, no added sounds  CVS: S1 S2 regular, no murmurs.  Abdomen: Bowel sounds present, Non tender and not distended with no gaurding, rigidity or rebound. Extremities: B/L Lower Ext shows no edema, both legs are warm to touch Neurology: Awake alert, and oriented X 3, CN II-XII intact, Non focal Skin: No Rash  Data Review Lab Results  Component Value Date   HGBA1C 5.9 10/05/2016   HGBA1C 6.0 01/14/2015   HGBA1C 6.3 09/08/2014   HbA1C today is 5.6% Assessment & Plan   1. Type 2 diabetes mellitus with diabetic nephropathy, without long-term current use of insulin (HCC)  - Glucose (CBG) - glipiZIDE (GLUCOTROL) 5 MG tablet; Take 1 tablet (5 mg total) 2 (two) times daily before a meal by mouth.  Dispense: 180 tablet; Refill: 3 - aspirin 81 MG tablet; Take 1  tablet (81 mg total) daily by mouth.  Dispense: 30 tablet; Refill: 5 - CBC with Differential/Platelet - Urinalysis, Complete  2. Dyslipidemia  - simvastatin (ZOCOR) 10 MG tablet; Take 1 tablet (10 mg total) daily at 6 PM by mouth.  Dispense: 90 tablet; Refill: 3 - Lipid panel  3. Essential hypertension  - lisinopril (PRINIVIL,ZESTRIL) 40 MG tablet; Take 1 tablet (40 mg total) daily by mouth.  Dispense: 90 tablet; Refill: 3 - hydrochlorothiazide (HYDRODIURIL) 25 MG tablet; Take 1 tablet (25 mg total) daily by mouth.  Dispense: 90 tablet; Refill: 3 - amLODipine  (NORVASC) 10 MG tablet; Take 1 tablet (10 mg total) daily by mouth.  Dispense: 90 tablet; Refill: 3 - CMP14+EGFR - TSH  Patient have been counseled extensively about nutrition and exercise. Other issues discussed during this visit include: low cholesterol diet, weight control and daily exercise, foot care, annual eye examinations at Ophthalmology, importance of adherence with medications and regular follow-up. We also discussed long term complications of uncontrolled diabetes and hypertension.   Return in about 3 months (around 01/17/2018) for Hemoglobin A1C and Follow up, DM, Follow up HTN.  The patient was given clear instructions to go to ER or return to medical center if symptoms don't improve, worsen or new problems develop. The patient verbalized understanding. The patient was told to call to get lab results if they haven't heard anything in the next week.   This note has been created with Surveyor, quantity. Any transcriptional errors are unintentional.    Angelica Chessman, MD, [place], Karilyn Cota, [place] and [place], [place]   10/17/2017, 9:31 AM

## 2017-10-17 NOTE — Patient Instructions (Signed)

## 2017-10-18 LAB — MICROSCOPIC EXAMINATION

## 2017-10-18 LAB — CMP14+EGFR
ALT: 13 IU/L (ref 0–32)
AST: 24 IU/L (ref 0–40)
Albumin/Globulin Ratio: 2 (ref 1.2–2.2)
Albumin: 4.7 g/dL (ref 3.6–4.8)
Alkaline Phosphatase: 60 IU/L (ref 39–117)
BUN/Creatinine Ratio: 16 (ref 12–28)
BUN: 24 mg/dL (ref 8–27)
Bilirubin Total: 0.3 mg/dL (ref 0.0–1.2)
CALCIUM: 9.3 mg/dL (ref 8.7–10.3)
CO2: 19 mmol/L — AB (ref 20–29)
CREATININE: 1.49 mg/dL — AB (ref 0.57–1.00)
Chloride: 113 mmol/L — ABNORMAL HIGH (ref 96–106)
GFR calc Af Amer: 41 mL/min/{1.73_m2} — ABNORMAL LOW (ref >59)
GFR calc non Af Amer: 36 mL/min/{1.73_m2} — ABNORMAL LOW (ref >59)
GLUCOSE: 104 mg/dL — AB (ref 65–99)
Globulin, Total: 2.3 g/dL (ref 1.5–4.5)
Potassium: 5.5 mmol/L — ABNORMAL HIGH (ref 3.5–5.2)
SODIUM: 146 mmol/L — AB (ref 134–144)
TOTAL PROTEIN: 7 g/dL (ref 6.0–8.5)

## 2017-10-18 LAB — URINALYSIS, COMPLETE
BILIRUBIN UA: NEGATIVE
Glucose, UA: NEGATIVE
Ketones, UA: NEGATIVE
Nitrite, UA: NEGATIVE
RBC UA: NEGATIVE
Specific Gravity, UA: 1.012 (ref 1.005–1.030)
UUROB: 0.2 mg/dL (ref 0.2–1.0)

## 2017-10-18 LAB — CBC WITH DIFFERENTIAL/PLATELET
BASOS: 0 %
Basophils Absolute: 0 10*3/uL (ref 0.0–0.2)
EOS (ABSOLUTE): 0.1 10*3/uL (ref 0.0–0.4)
EOS: 3 %
Hematocrit: 28.8 % — ABNORMAL LOW (ref 34.0–46.6)
Hemoglobin: 9.4 g/dL — ABNORMAL LOW (ref 11.1–15.9)
IMMATURE GRANS (ABS): 0 10*3/uL (ref 0.0–0.1)
IMMATURE GRANULOCYTES: 1 %
LYMPHS: 30 %
Lymphocytes Absolute: 1.3 10*3/uL (ref 0.7–3.1)
MCH: 28.4 pg (ref 26.6–33.0)
MCHC: 32.6 g/dL (ref 31.5–35.7)
MCV: 87 fL (ref 79–97)
MONOS ABS: 0.3 10*3/uL (ref 0.1–0.9)
Monocytes: 8 %
NEUTROS PCT: 58 %
Neutrophils Absolute: 2.5 10*3/uL (ref 1.4–7.0)
PLATELETS: 191 10*3/uL (ref 150–379)
RBC: 3.31 x10E6/uL — ABNORMAL LOW (ref 3.77–5.28)
RDW: 14.8 % (ref 12.3–15.4)
WBC: 4.3 10*3/uL (ref 3.4–10.8)

## 2017-10-18 LAB — LIPID PANEL
CHOLESTEROL TOTAL: 172 mg/dL (ref 100–199)
Chol/HDL Ratio: 3.1 ratio (ref 0.0–4.4)
HDL: 56 mg/dL (ref >39)
LDL CALC: 103 mg/dL — AB (ref 0–99)
TRIGLYCERIDES: 65 mg/dL (ref 0–149)
VLDL Cholesterol Cal: 13 mg/dL (ref 5–40)

## 2017-10-18 LAB — TSH: TSH: 1.3 u[IU]/mL (ref 0.450–4.500)

## 2017-10-21 ENCOUNTER — Other Ambulatory Visit: Payer: Self-pay | Admitting: Internal Medicine

## 2017-10-21 MED ORDER — CEPHALEXIN 500 MG PO CAPS: 500.0000 mg | ORAL_CAPSULE | Freq: Two times a day (BID) | ORAL | 0 refills | Status: AC

## 2017-11-06 ENCOUNTER — Telehealth: Payer: Self-pay | Admitting: *Deleted

## 2017-11-06 NOTE — Telephone Encounter (Signed)
Patient verified DOB Patient is aware of urine showing an infection. MA confirmed with [facility] that the medication is still on file and may be picked up. No further questions at this time.

## 2017-11-06 NOTE — Telephone Encounter (Signed)
-----   Message from Tresa Garter, MD sent at 10/21/2017 10:30 AM EST ----- Please inform patient that her lab results are stable. Urine showed possible infection. Antibiotics sent to the pharmacy for pick up, take for 5 days.

## 2017-11-09 ENCOUNTER — Telehealth: Payer: Self-pay | Admitting: *Deleted

## 2017-11-09 NOTE — Telephone Encounter (Signed)
Patient verified DOB Patient is aware of mammogram showing no evidence of malignancy. Recommended screening be completed in one year. No further questions.

## 2017-11-09 NOTE — Telephone Encounter (Signed)
-----   Message from Tresa Garter, MD sent at 10/18/2017  5:55 PM EST ----- Please inform patient that her screening mammogram shows no evidence of malignancy. Recommend screening mammogram in one year

## 2018-01-23 ENCOUNTER — Ambulatory Visit: Payer: Medicare Other | Attending: Internal Medicine | Admitting: Internal Medicine

## 2018-01-23 VITALS — BP 132/73 | HR 68 | Temp 97.6°F | Resp 12 | Ht 61.0 in | Wt 144.4 lb

## 2018-01-23 DIAGNOSIS — Z7982 Long term (current) use of aspirin: Secondary | ICD-10-CM | POA: Insufficient documentation

## 2018-01-23 DIAGNOSIS — Z79899 Other long term (current) drug therapy: Secondary | ICD-10-CM | POA: Diagnosis not present

## 2018-01-23 DIAGNOSIS — I10 Essential (primary) hypertension: Secondary | ICD-10-CM | POA: Insufficient documentation

## 2018-01-23 DIAGNOSIS — Z7984 Long term (current) use of oral hypoglycemic drugs: Secondary | ICD-10-CM | POA: Insufficient documentation

## 2018-01-23 DIAGNOSIS — E785 Hyperlipidemia, unspecified: Secondary | ICD-10-CM | POA: Diagnosis not present

## 2018-01-23 DIAGNOSIS — E1121 Type 2 diabetes mellitus with diabetic nephropathy: Secondary | ICD-10-CM | POA: Diagnosis not present

## 2018-01-23 DIAGNOSIS — F17210 Nicotine dependence, cigarettes, uncomplicated: Secondary | ICD-10-CM | POA: Insufficient documentation

## 2018-01-23 DIAGNOSIS — Z23 Encounter for immunization: Secondary | ICD-10-CM

## 2018-01-23 DIAGNOSIS — Z90710 Acquired absence of both cervix and uterus: Secondary | ICD-10-CM | POA: Diagnosis not present

## 2018-01-23 LAB — GLUCOSE, POCT (MANUAL RESULT ENTRY): POC Glucose: 146 mg/dl — AB (ref 70–99)

## 2018-01-23 MED ORDER — HYDROCHLOROTHIAZIDE 25 MG PO TABS: 25.0000 mg | ORAL_TABLET | Freq: Every day | ORAL | 3 refills | Status: DC

## 2018-01-23 MED ORDER — GLIPIZIDE 5 MG PO TABS: 5.0000 mg | ORAL_TABLET | Freq: Two times a day (BID) | ORAL | 3 refills | Status: DC

## 2018-01-23 MED ORDER — AMLODIPINE BESYLATE 10 MG PO TABS: 10.0000 mg | ORAL_TABLET | Freq: Every day | ORAL | 3 refills | Status: DC

## 2018-01-23 MED ORDER — SIMVASTATIN 10 MG PO TABS: 10.0000 mg | ORAL_TABLET | Freq: Every day | ORAL | 3 refills | Status: DC

## 2018-01-23 MED ORDER — LISINOPRIL 40 MG PO TABS: 40.0000 mg | ORAL_TABLET | Freq: Every day | ORAL | 3 refills | Status: DC

## 2018-01-23 NOTE — Patient Instructions (Signed)
Diabetes and Foot Care Diabetes may cause you to have problems because of poor blood supply (circulation) to your feet and legs. This may cause the skin on your feet to become thinner, break easier, and heal more slowly. Your skin may become dry, and the skin may peel and crack. You may also have nerve damage in your legs and feet causing decreased feeling in them. You may not notice minor injuries to your feet that could lead to infections or more serious problems. Taking care of your feet is one of the most important things you can do for yourself. Follow these instructions at home:  Wear shoes at all times, even in the house. Do not go barefoot. Bare feet are easily injured.  Check your feet daily for blisters, cuts, and redness. If you cannot see the bottom of your feet, use a mirror or ask someone for help.  Wash your feet with warm water (do not use hot water) and mild soap. Then pat your feet and the areas between your toes until they are completely dry. Do not soak your feet as this can dry your skin.  Apply a moisturizing lotion or petroleum jelly (that does not contain alcohol and is unscented) to the skin on your feet and to dry, brittle toenails. Do not apply lotion between your toes.  Trim your toenails straight across. Do not dig under them or around the cuticle. File the edges of your nails with an emery board or nail file.  Do not cut corns or calluses or try to remove them with medicine.  Wear clean socks or stockings every day. Make sure they are not too tight. Do not wear knee-high stockings since they may decrease blood flow to your legs.  Wear shoes that fit properly and have enough cushioning. To break in new shoes, wear them for just a few hours a day. This prevents you from injuring your feet. Always look in your shoes before you put them on to be sure there are no objects inside.  Do not cross your legs. This may decrease the blood flow to your feet.  If you find a  minor scrape, cut, or break in the skin on your feet, keep it and the skin around it clean and dry. These areas may be cleansed with mild soap and water. Do not cleanse the area with peroxide, alcohol, or iodine.  When you remove an adhesive bandage, be sure not to damage the skin around it.  If you have a wound, look at it several times a day to make sure it is healing.  Do not use heating pads or hot water bottles. They may burn your skin. If you have lost feeling in your feet or legs, you may not know it is happening until it is too late.  Make sure your health care provider performs a complete foot exam at least annually or more often if you have foot problems. Report any cuts, sores, or bruises to your health care provider immediately. Contact a health care provider if:  You have an injury that is not healing.  You have cuts or breaks in the skin.  You have an ingrown nail.  You notice redness on your legs or feet.  You feel burning or tingling in your legs or feet.  You have pain or cramps in your legs and feet.  Your legs or feet are numb.  Your feet always feel cold. Get help right away if:  There is increasing   redness, swelling, or pain in or around a wound.  There is a red line that goes up your leg.  Pus is coming from a wound.  You develop a fever or as directed by your health care provider.  You notice a bad smell coming from an ulcer or wound. This information is not intended to replace advice given to you by your health care provider. Make sure you discuss any questions you have with your health care provider. Document Released: 11/17/2000 Document Revised: 04/27/2016 Document Reviewed: 04/29/2013 Elsevier Interactive Patient Education  2017 [place]. Diabetes Mellitus and Nutrition When you have diabetes (diabetes mellitus), it is very important to have healthy eating habits because your blood sugar (glucose) levels are greatly affected by what you eat  and drink. Eating healthy foods in the appropriate amounts, at about the same times every day, can help you:  Control your blood glucose.  Lower your risk of heart disease.  Improve your blood pressure.  Reach or maintain a healthy weight.  Every person with diabetes is different, and each person has different needs for a meal plan. Your health care provider may recommend that you work with a diet and nutrition specialist (dietitian) to make a meal plan that is best for you. Your meal plan may vary depending on factors such as:  The calories you need.  The medicines you take.  Your weight.  Your blood glucose, blood pressure, and cholesterol levels.  Your activity level.  Other health conditions you have, such as heart or kidney disease.  How do carbohydrates affect me? Carbohydrates affect your blood glucose level more than any other type of food. Eating carbohydrates naturally increases the amount of glucose in your blood. Carbohydrate counting is a method for keeping track of how many carbohydrates you eat. Counting carbohydrates is important to keep your blood glucose at a healthy level, especially if you use insulin or take certain oral diabetes medicines. It is important to know how many carbohydrates you can safely have in each meal. This is different for every person. Your dietitian can help you calculate how many carbohydrates you should have at each meal and for snack. Foods that contain carbohydrates include:  Bread, cereal, rice, pasta, and crackers.  Potatoes and corn.  Peas, beans, and lentils.  Milk and yogurt.  Fruit and juice.  Desserts, such as cakes, cookies, ice cream, and candy.  How does alcohol affect me? Alcohol can cause a sudden decrease in blood glucose (hypoglycemia), especially if you use insulin or take certain oral diabetes medicines. Hypoglycemia can be a life-threatening condition. Symptoms of hypoglycemia (sleepiness, dizziness, and  confusion) are similar to symptoms of having too much alcohol. If your health care provider says that alcohol is safe for you, follow these guidelines:  Limit alcohol intake to no more than 1 drink per day for nonpregnant women and 2 drinks per day for men. One drink equals 12 oz of beer, 5 oz of wine, or 1 oz of hard liquor.  Do not drink on an empty stomach.  Keep yourself hydrated with water, diet soda, or unsweetened iced tea.  Keep in mind that regular soda, juice, and other mixers may contain a lot of sugar and must be counted as carbohydrates.  What are tips for following this plan? Reading food labels  Start by checking the serving size on the label. The amount of calories, carbohydrates, fats, and other nutrients listed on the label are based on one serving of the food.  Many foods contain more than one serving per package.  Check the total grams (g) of carbohydrates in one serving. You can calculate the number of servings of carbohydrates in one serving by dividing the total carbohydrates by 15. For example, if a food has 30 g of total carbohydrates, it would be equal to 2 servings of carbohydrates.  Check the number of grams (g) of saturated and trans fats in one serving. Choose foods that have low or no amount of these fats.  Check the number of milligrams (mg) of sodium in one serving. Most people should limit total sodium intake to less than 2,300 mg per day.  Always check the nutrition information of foods labeled as "low-fat" or "nonfat". These foods may be higher in added sugar or refined carbohydrates and should be avoided.  Talk to your dietitian to identify your daily goals for nutrients listed on the label. Shopping  Avoid buying canned, premade, or processed foods. These foods tend to be high in fat, sodium, and added sugar.  Shop around the outside edge of the grocery store. This includes fresh fruits and vegetables, bulk grains, fresh meats, and fresh  dairy. Cooking  Use low-heat cooking methods, such as baking, instead of high-heat cooking methods like deep frying.  Cook using healthy oils, such as olive, canola, or sunflower oil.  Avoid cooking with butter, cream, or high-fat meats. Meal planning  Eat meals and snacks regularly, preferably at the same times every day. Avoid going long periods of time without eating.  Eat foods high in fiber, such as fresh fruits, vegetables, beans, and whole grains. Talk to your dietitian about how many servings of carbohydrates you can eat at each meal.  Eat 4-6 ounces of lean protein each day, such as lean meat, chicken, fish, eggs, or tofu. 1 ounce is equal to 1 ounce of meat, chicken, or fish, 1 egg, or 1/4 cup of tofu.  Eat some foods each day that contain healthy fats, such as avocado, nuts, seeds, and fish. Lifestyle   Check your blood glucose regularly.  Exercise at least 30 minutes 5 or more days each week, or as told by your health care provider.  Take medicines as told by your health care provider.  Do not use any products that contain nicotine or tobacco, such as cigarettes and e-cigarettes. If you need help quitting, ask your health care provider.  Work with a Social worker or diabetes educator to identify strategies to manage stress and any emotional and social challenges. What are some questions to ask my health care provider?  Do I need to meet with a diabetes educator?  Do I need to meet with a dietitian?  What number can I call if I have questions?  When are the best times to check my blood glucose? Where to find more information:  American Diabetes Association: diabetes.org/food-and-fitness/food  Academy of Nutrition and Dietetics: PokerClues.dk  [employer] of Diabetes and Digestive and Kidney Diseases (NIH): ContactWire.be Summary  A  healthy meal plan will help you control your blood glucose and maintain a healthy lifestyle.  Working with a diet and nutrition specialist (dietitian) can help you make a meal plan that is best for you.  Keep in mind that carbohydrates and alcohol have immediate effects on your blood glucose levels. It is important to count carbohydrates and to use alcohol carefully. This information is not intended to replace advice given to you by your health care provider. Make sure you discuss any questions you have  with your health care provider. Document Released: 08/17/2005 Document Revised: 12/25/2016 Document Reviewed: 12/25/2016 Elsevier Interactive Patient Education  [employer].

## 2018-01-23 NOTE — Progress Notes (Signed)
F/u HTN 

## 2018-01-23 NOTE — Progress Notes (Signed)
Subjective:  Patient ID: Andrea Brown, female    DOB: 03/25/1951  Age: 67 y.o. MRN: [id number]  CC: Hypertension and Diabetes   HPI Andrea Brown is a 67 year old female with a PMH of T2DM, HTN, HLD who presents today for follow-up. No complaints at this time.  Reports adherence with medications. Checks blood sugars once daily, ranges between 90s-130s. Tries to follow a diabetic diet and walks 2-3x a week. Denies hypoglycemic episodes, visual changes, and numbness/tingling of extremities.   Continues to smoke cigarettes but has reduced the amount to 1 pack every 3-4 days. Adheres to a low sodium diet. Denies headaches, chest pain, or SOB.    Past Medical History:  Diagnosis Date  . Diabetes mellitus without complication ([place])   . Hyperlipidemia   . Hypertension    Past Surgical History:  Procedure Laterality Date  . TOTAL ABDOMINAL HYSTERECTOMY  2000   No Known Allergies   Outpatient Medications Prior to Visit  Medication Sig Dispense Refill  . aspirin 81 MG tablet Take 1 tablet (81 mg total) daily by mouth. 30 tablet 5  . amLODipine (NORVASC) 10 MG tablet Take 1 tablet (10 mg total) daily by mouth. 90 tablet 3  . glipiZIDE (GLUCOTROL) 5 MG tablet Take 1 tablet (5 mg total) 2 (two) times daily before a meal by mouth. 180 tablet 3  . hydrochlorothiazide (HYDRODIURIL) 25 MG tablet Take 1 tablet (25 mg total) daily by mouth. 90 tablet 3  . lisinopril (PRINIVIL,ZESTRIL) 40 MG tablet Take 1 tablet (40 mg total) daily by mouth. 90 tablet 3  . simvastatin (ZOCOR) 10 MG tablet Take 1 tablet (10 mg total) daily at 6 PM by mouth. 90 tablet 3  . traMADol (ULTRAM) 50 MG tablet Take 1 tablet (50 mg total) by mouth every 6 (six) hours as needed. (Patient not taking: Reported on 01/23/2018) 8 tablet 0   No facility-administered medications prior to visit.     ROS Review of Systems  Constitutional: Negative.   HENT: Negative.   Eyes: Negative for visual disturbance.  Respiratory: Negative  for cough and shortness of breath.   Cardiovascular: Negative for chest pain and palpitations.  Gastrointestinal: Negative.   Endocrine: Negative for polydipsia, polyphagia and polyuria.  Genitourinary: Negative.   Musculoskeletal: Negative.   Skin: Negative.   Neurological: Negative for weakness, numbness and headaches.  Psychiatric/Behavioral: Negative.     Objective:  BP 132/73 (BP Location: Left Arm, Patient Position: Sitting, Cuff Size: Normal)   Pulse 68   Temp 97.6 F (36.4 C) (Oral)   Resp 12   Ht 5\' 1"  (1.549 m)   Wt 144 lb 6.4 oz (65.5 kg)   SpO2 100%   BMI 27.28 kg/m   BP/Weight 01/23/2018 10/17/2017 82/04/538  Systolic BP 767 341 937  Diastolic BP 73 67 63  Wt. (Lbs) 144.4 141 139.8  BMI 27.28 26.64 26.41   Lab Results  Component Value Date   POCGLU 146 (A) 01/23/2018   POCGLU 111 (A) 10/17/2017   POCGLU 99 10/05/2016   Lab Results  Component Value Date   HGBA1C 5.6 10/17/2017   Physical Exam  Constitutional: She is oriented to person, place, and time. She appears well-developed and well-nourished. No distress.  HENT:  Head: Normocephalic.  Neck: Normal range of motion.  Cardiovascular: Normal rate, regular rhythm, normal heart sounds and intact distal pulses.  Pulmonary/Chest: Effort normal and breath sounds normal.  Abdominal: Soft. Bowel sounds are normal.  Musculoskeletal: Normal range  of motion.  Neurological: She is alert and oriented to person, place, and time.  Skin: Skin is warm and dry.  Psychiatric: She has a normal mood and affect. Her behavior is normal. Thought content normal.     Assessment & Plan:   1. Type 2 diabetes mellitus with diabetic nephropathy, without long-term current use of insulin (HCC) Controlled. Previous A1C was 5.7 Encouraged diabetic diet and exercise  - Glucose (CBG) - Hemoglobin A1c - glipiZIDE (GLUCOTROL) 5 MG tablet; Take 1 tablet (5 mg total) by mouth 2 (two) times daily before a meal.  Dispense: 180  tablet; Refill: 3  2. Essential hypertension Controlled We have discussed target BP range and blood pressure goal. I have advised patient to check BP regularly and to call us back or report to clinic if the numbers are consistently higher than 140/90. We discussed the importance of compliance with medical therapy and DASH diet recommended, consequences of uncontrolled hypertension discussed.   -Current BP medications refilled - amLODipine (NORVASC) 10 MG tablet; Take 1 tablet (10 mg total) by mouth daily.  Dispense: 90 tablet; Refill: 3 - hydrochlorothiazide (HYDRODIURIL) 25 MG tablet; Take 1 tablet (25 mg total) by mouth daily.  Dispense: 90 tablet; Refill: 3 - lisinopril (PRINIVIL,ZESTRIL) 40 MG tablet; Take 1 tablet (40 mg total) by mouth daily.  Dispense: 90 tablet; Refill: 3  3. Dyslipidemia - simvastatin (ZOCOR) 10 MG tablet; Take 1 tablet (10 mg total) by mouth daily at 6 PM.  Dispense: 90 tablet; Refill: 3  4. Need for pneumococcal vaccine Will be administered by pharmacy    Meds ordered this encounter  Medications  . amLODipine (NORVASC) 10 MG tablet    Sig: Take 1 tablet (10 mg total) by mouth daily.    Dispense:  90 tablet    Refill:  3  . glipiZIDE (GLUCOTROL) 5 MG tablet    Sig: Take 1 tablet (5 mg total) by mouth 2 (two) times daily before a meal.    Dispense:  180 tablet    Refill:  3  . hydrochlorothiazide (HYDRODIURIL) 25 MG tablet    Sig: Take 1 tablet (25 mg total) by mouth daily.    Dispense:  90 tablet    Refill:  3  . lisinopril (PRINIVIL,ZESTRIL) 40 MG tablet    Sig: Take 1 tablet (40 mg total) by mouth daily.    Dispense:  90 tablet    Refill:  3  . simvastatin (ZOCOR) 10 MG tablet    Sig: Take 1 tablet (10 mg total) by mouth daily at 6 PM.    Dispense:  90 tablet    Refill:  3    Follow-up: No Follow-up on file.   Jonnie Kind DNP Student  Evaluation and management procedures were performed by me with DNP Student in attendance, note written by DNP  student under my supervision and collaboration. I have reviewed the note and I agree with the management and plan.   Angelica Chessman, MD, MHA, [place], Karilyn Cota [facility] [place], [place]   01/27/2018, 7:40 PM

## 2018-01-24 LAB — HEMOGLOBIN A1C
ESTIMATED AVERAGE GLUCOSE: 120 mg/dL
Hgb A1c MFr Bld: 5.8 % — ABNORMAL HIGH (ref 4.8–5.6)

## 2018-04-22 ENCOUNTER — Ambulatory Visit: Payer: Medicare Other | Attending: Family Medicine | Admitting: Family Medicine

## 2018-04-22 ENCOUNTER — Encounter: Payer: Self-pay | Admitting: Family Medicine

## 2018-04-22 VITALS — BP 148/66 | HR 70 | Temp 97.3°F | Ht 61.0 in | Wt 146.4 lb

## 2018-04-22 DIAGNOSIS — Z23 Encounter for immunization: Secondary | ICD-10-CM

## 2018-04-22 DIAGNOSIS — Z7982 Long term (current) use of aspirin: Secondary | ICD-10-CM | POA: Diagnosis not present

## 2018-04-22 DIAGNOSIS — Z79899 Other long term (current) drug therapy: Secondary | ICD-10-CM | POA: Diagnosis not present

## 2018-04-22 DIAGNOSIS — Z1159 Encounter for screening for other viral diseases: Secondary | ICD-10-CM

## 2018-04-22 DIAGNOSIS — I10 Essential (primary) hypertension: Secondary | ICD-10-CM | POA: Diagnosis not present

## 2018-04-22 DIAGNOSIS — E875 Hyperkalemia: Secondary | ICD-10-CM | POA: Diagnosis not present

## 2018-04-22 DIAGNOSIS — Z90710 Acquired absence of both cervix and uterus: Secondary | ICD-10-CM | POA: Diagnosis not present

## 2018-04-22 DIAGNOSIS — E785 Hyperlipidemia, unspecified: Secondary | ICD-10-CM | POA: Diagnosis not present

## 2018-04-22 DIAGNOSIS — E1165 Type 2 diabetes mellitus with hyperglycemia: Secondary | ICD-10-CM | POA: Diagnosis not present

## 2018-04-22 LAB — POCT GLYCOSYLATED HEMOGLOBIN (HGB A1C): HEMOGLOBIN A1C: 5.7

## 2018-04-22 LAB — GLUCOSE, POCT (MANUAL RESULT ENTRY): POC Glucose: 71 mg/dl (ref 70–99)

## 2018-04-22 MED ORDER — PNEUMOCOCCAL 13-VAL CONJ VACC IM SUSP: 0.5000 mL | INTRAMUSCULAR | 0 refills | Status: DC

## 2018-04-22 NOTE — Patient Instructions (Signed)

## 2018-04-22 NOTE — Progress Notes (Signed)
Subjective:  Patient ID: Andrea Brown, female    DOB: 04-04-1951  Age: 67 y.o. MRN: [id number]  CC: Diabetes   HPI Andrea Brown is a 67 year old female with a history of type 2 diabetes mellitus (A1c 5.7), hypertension, dyslipidemia who presents today to establish care with me as she was previously followed by Dr Doreene Burke. Her blood pressure is slightly elevated today and she endorses compliance with her antihypertensive. She has also been taking her statin and denies myalgias. With regards to her diabetes mellitus, she denies hypoglycemia, numbness in extremities or visual concerns and has been compliant with her medications, diabetic diet and exercise. Review of her last set of labs from 10/2017 revealed hyperkalemia 5.5. She has no additional concerns today.  Past Medical History:  Diagnosis Date  . Diabetes mellitus without complication ([place])   . Hyperlipidemia   . Hypertension     Past Surgical History:  Procedure Laterality Date  . TOTAL ABDOMINAL HYSTERECTOMY  2000    No Known Allergies   Outpatient Medications Prior to Visit  Medication Sig Dispense Refill  . amLODipine (NORVASC) 10 MG tablet Take 1 tablet (10 mg total) by mouth daily. 90 tablet 3  . aspirin 81 MG tablet Take 1 tablet (81 mg total) daily by mouth. 30 tablet 5  . glipiZIDE (GLUCOTROL) 5 MG tablet Take 1 tablet (5 mg total) by mouth 2 (two) times daily before a meal. 180 tablet 3  . hydrochlorothiazide (HYDRODIURIL) 25 MG tablet Take 1 tablet (25 mg total) by mouth daily. 90 tablet 3  . lisinopril (PRINIVIL,ZESTRIL) 40 MG tablet Take 1 tablet (40 mg total) by mouth daily. 90 tablet 3  . simvastatin (ZOCOR) 10 MG tablet Take 1 tablet (10 mg total) by mouth daily at 6 PM. 90 tablet 3   No facility-administered medications prior to visit.     ROS Review of Systems  Constitutional: Negative for activity change, appetite change and fatigue.  HENT: Negative for congestion, sinus pressure and sore throat.     Eyes: Negative for visual disturbance.  Respiratory: Negative for cough, chest tightness, shortness of breath and wheezing.   Cardiovascular: Negative for chest pain and palpitations.  Gastrointestinal: Negative for abdominal distention, abdominal pain and constipation.  Endocrine: Negative for polydipsia.  Genitourinary: Negative for dysuria and frequency.  Musculoskeletal: Negative for arthralgias and back pain.  Skin: Negative for rash.  Neurological: Negative for tremors, light-headedness and numbness.  Hematological: Does not bruise/bleed easily.  Psychiatric/Behavioral: Negative for agitation and behavioral problems.    Objective:  BP (!) 148/66   Pulse 70   Temp (!) 97.3 F (36.3 C) (Oral)   Ht 5' 1"  (1.549 m)   Wt 146 lb 6.4 oz (66.4 kg)   SpO2 99%   BMI 27.66 kg/m   BP/Weight 04/22/2018 01/23/2018 20/25/4270  Systolic BP 623 762 831  Diastolic BP 66 73 67  Wt. (Lbs) 146.4 144.4 141  BMI 27.66 27.28 26.64      Physical Exam  Constitutional: She is oriented to person, place, and time. She appears well-developed and well-nourished.  HENT:  Loss of multiple teeth  Cardiovascular: Normal rate, normal heart sounds and intact distal pulses.  No murmur heard. Pulmonary/Chest: Effort normal and breath sounds normal. She has no wheezes. She has no rales. She exhibits no tenderness.  Abdominal: Soft. Bowel sounds are normal. She exhibits no distension and no mass. There is no tenderness.  Musculoskeletal: Normal range of motion.  Neurological: She is alert and  oriented to person, place, and time.  Skin: Skin is warm and dry.  Psychiatric: She has a normal mood and affect.     CMP Latest Ref Rng & Units 10/17/2017 10/05/2016 06/08/2014  Glucose 65 - 99 mg/dL 104(H) 112(H) 142(H)  BUN 8 - 27 mg/dL 24 50(H) 39(H)  Creatinine 0.57 - 1.00 mg/dL 1.49(H) 2.31(H) 1.67(H)  Sodium 134 - 144 mmol/L 146(H) 141 142  Potassium 3.5 - 5.2 mmol/L 5.5(H) 5.4(H) 5.2  Chloride 96 - 106  mmol/L 113(H) 110 110  CO2 20 - 29 mmol/L 19(L) 21 20  Calcium 8.7 - 10.3 mg/dL 9.3 9.2 9.2  Total Protein 6.0 - 8.5 g/dL 7.0 7.4 7.2  Total Bilirubin 0.0 - 1.2 mg/dL 0.3 0.4 0.3  Alkaline Phos 39 - 117 IU/L 60 54 54  AST 0 - 40 IU/L 24 14 16   ALT 0 - 32 IU/L 13 8 8     Lipid Panel     Component Value Date/Time   CHOL 172 10/17/2017 0933   TRIG 65 10/17/2017 0933   HDL 56 10/17/2017 0933   CHOLHDL 3.1 10/17/2017 0933   CHOLHDL 3.7 10/05/2016 1048   VLDL 19 10/05/2016 1048   LDLCALC 103 (H) 10/17/2017 0933   LDLDIRECT 40 09/09/2009 2145    Lab Results  Component Value Date   HGBA1C 5.7 04/22/2018    Assessment & Plan:   1. Type 2 diabetes mellitus with hyperglycemia, without long-term current use of insulin (HCC) Controlled with A1c of 5.7 Continue current regimen - POCT glucose (manual entry) - POCT glycosylated hemoglobin (Hb A1C) - CMP14+EGFR - Lipid panel - Microalbumin/Creatinine Ratio, Urine  2. Need for hepatitis C screening test - Hepatitis c antibody (reflex)  3. Essential hypertension Likely elevated No regimen change today Low sodium, DASH diet  4.  Dyslipidemia Continue statin Low-cholesterol diet  5.  Hyperkalemia Last potassium was 5.5 We will repeat today and reduce dose of lisinopril if still hyperkalemic   Meds ordered this encounter  Medications  . DISCONTD: pneumococcal 13-valent conjugate vaccine (PREVNAR 13) SUSP injection    Sig: Inject 0.5 mLs into the muscle tomorrow at 10 am for 1 dose.    Dispense:  0.5 mL    Refill:  0    Follow-up: Return in about 3 months (around 07/23/2018) for Follow-up of chronic medical conditions.   Charlott Rakes MD

## 2018-04-23 ENCOUNTER — Other Ambulatory Visit: Payer: Self-pay | Admitting: Family Medicine

## 2018-04-23 ENCOUNTER — Telehealth: Payer: Self-pay

## 2018-04-23 DIAGNOSIS — I10 Essential (primary) hypertension: Secondary | ICD-10-CM

## 2018-04-23 LAB — MICROALBUMIN / CREATININE URINE RATIO
Creatinine, Urine: 82 mg/dL
Microalb/Creat Ratio: 30.6 mg/g creat — ABNORMAL HIGH (ref 0.0–30.0)
Microalbumin, Urine: 25.1 ug/mL

## 2018-04-23 LAB — LIPID PANEL
CHOLESTEROL TOTAL: 127 mg/dL (ref 100–199)
Chol/HDL Ratio: 2.4 ratio (ref 0.0–4.4)
HDL: 52 mg/dL (ref >39)
LDL Calculated: 63 mg/dL (ref 0–99)
TRIGLYCERIDES: 59 mg/dL (ref 0–149)
VLDL CHOLESTEROL CAL: 12 mg/dL (ref 5–40)

## 2018-04-23 LAB — CMP14+EGFR
ALBUMIN: 4.5 g/dL (ref 3.6–4.8)
ALK PHOS: 55 IU/L (ref 39–117)
ALT: 14 IU/L (ref 0–32)
AST: 19 IU/L (ref 0–40)
Albumin/Globulin Ratio: 1.8 (ref 1.2–2.2)
BUN/Creatinine Ratio: 22 (ref 12–28)
BUN: 39 mg/dL — AB (ref 8–27)
CHLORIDE: 111 mmol/L — AB (ref 96–106)
CO2: 18 mmol/L — ABNORMAL LOW (ref 20–29)
Calcium: 9.1 mg/dL (ref 8.7–10.3)
Creatinine, Ser: 1.79 mg/dL — ABNORMAL HIGH (ref 0.57–1.00)
GFR calc Af Amer: 33 mL/min/{1.73_m2} — ABNORMAL LOW (ref >59)
GFR calc non Af Amer: 29 mL/min/{1.73_m2} — ABNORMAL LOW (ref >59)
GLUCOSE: 70 mg/dL (ref 65–99)
Globulin, Total: 2.5 g/dL (ref 1.5–4.5)
Potassium: 5.3 mmol/L — ABNORMAL HIGH (ref 3.5–5.2)
Sodium: 142 mmol/L (ref 134–144)
Total Protein: 7 g/dL (ref 6.0–8.5)

## 2018-04-23 LAB — HCV COMMENT:

## 2018-04-23 LAB — HEPATITIS C ANTIBODY (REFLEX): HCV Ab: <0.1 s/co ratio (ref 0.0–0.9)

## 2018-04-23 MED ORDER — LISINOPRIL 20 MG PO TABS: 20.0000 mg | ORAL_TABLET | Freq: Every day | ORAL | 1 refills | Status: DC

## 2018-04-23 MED ORDER — ISOSORBIDE MONONITRATE ER 60 MG PO TB24: 60.0000 mg | ORAL_TABLET | Freq: Every day | ORAL | 1 refills | Status: DC

## 2018-04-23 NOTE — Telephone Encounter (Signed)
Patient was called and there is no voicemail set up to leave a message. 

## 2018-04-23 NOTE — Telephone Encounter (Signed)
-----   Message from Charlott Rakes, MD sent at 04/23/2018 12:48 PM EDT ----- Cholesterol is normal, kidney function shows a slight decline.  Please advise to abstain from use of NSAIDs.  Potassium is slightly elevated and I have reduced lisinopril from 40 mg to 20 mg and added another antihypertensive called isosorbide to her regimen for optimal blood pressure control.

## 2018-04-25 ENCOUNTER — Telehealth: Payer: Self-pay

## 2018-04-25 NOTE — Telephone Encounter (Signed)
-----   Message from Charlott Rakes, MD sent at 04/23/2018 12:48 PM EDT ----- Cholesterol is normal, kidney function shows a slight decline.  Please advise to abstain from use of NSAIDs.  Potassium is slightly elevated and I have reduced lisinopril from 40 mg to 20 mg and added another antihypertensive called isosorbide to her regimen for optimal blood pressure control.

## 2018-04-25 NOTE — Telephone Encounter (Signed)
Patient was called and informed of lab results. 

## 2018-07-23 ENCOUNTER — Encounter: Payer: Self-pay | Admitting: Family Medicine

## 2018-07-23 ENCOUNTER — Ambulatory Visit: Payer: Medicare Other | Attending: Family Medicine | Admitting: Family Medicine

## 2018-07-23 ENCOUNTER — Other Ambulatory Visit: Payer: Self-pay

## 2018-07-23 VITALS — BP 129/73 | HR 67 | Temp 98.8°F | Resp 16 | Wt 147.2 lb

## 2018-07-23 DIAGNOSIS — Z0000 Encounter for general adult medical examination without abnormal findings: Secondary | ICD-10-CM

## 2018-07-23 DIAGNOSIS — M62838 Other muscle spasm: Secondary | ICD-10-CM | POA: Insufficient documentation

## 2018-07-23 DIAGNOSIS — I10 Essential (primary) hypertension: Secondary | ICD-10-CM

## 2018-07-23 DIAGNOSIS — E1165 Type 2 diabetes mellitus with hyperglycemia: Secondary | ICD-10-CM | POA: Diagnosis not present

## 2018-07-23 DIAGNOSIS — N183 Chronic kidney disease, stage 3 unspecified: Secondary | ICD-10-CM

## 2018-07-23 DIAGNOSIS — I129 Hypertensive chronic kidney disease with stage 1 through stage 4 chronic kidney disease, or unspecified chronic kidney disease: Secondary | ICD-10-CM | POA: Insufficient documentation

## 2018-07-23 DIAGNOSIS — Z7984 Long term (current) use of oral hypoglycemic drugs: Secondary | ICD-10-CM | POA: Diagnosis not present

## 2018-07-23 DIAGNOSIS — Z7982 Long term (current) use of aspirin: Secondary | ICD-10-CM | POA: Insufficient documentation

## 2018-07-23 DIAGNOSIS — Z79899 Other long term (current) drug therapy: Secondary | ICD-10-CM | POA: Diagnosis not present

## 2018-07-23 DIAGNOSIS — E785 Hyperlipidemia, unspecified: Secondary | ICD-10-CM | POA: Diagnosis not present

## 2018-07-23 DIAGNOSIS — M25511 Pain in right shoulder: Secondary | ICD-10-CM | POA: Diagnosis not present

## 2018-07-23 DIAGNOSIS — E1122 Type 2 diabetes mellitus with diabetic chronic kidney disease: Secondary | ICD-10-CM | POA: Insufficient documentation

## 2018-07-23 LAB — POCT GLYCOSYLATED HEMOGLOBIN (HGB A1C): HEMOGLOBIN A1C: 5.8 % — AB (ref 4.0–5.6)

## 2018-07-23 LAB — POCT CBG (FASTING - GLUCOSE)-MANUAL ENTRY: Glucose Fasting, POC: 112 mg/dL — AB (ref 70–99)

## 2018-07-23 NOTE — Progress Notes (Signed)
Subjective:  Patient ID: Andrea Brown, female    DOB: 03-Feb-1951  Age: 67 y.o. MRN: [id number]  CC: Follow-up   HPI Andrea Brown  is a 67 year old female with a history of type 2 diabetes mellitus (A1c 5.8), hypertension, dyslipidemia, tobacco abuse here for follow-up visit. She does have intermittent posterior right shoulder pain which started after she performed some heavy lifting.  Pain is only present when she reaches out otherwise is absent and is described as mild and improved with the use of OTC analgesics. With regards to her diabetes mellitus, she denies hypoglycemia, visual concerns, numbness in extremities. Her chronic kidney disease is managed by Dr. Moshe Cipro of [facility] and she has an appointment coming up on 08/07/2018.  She has been compliant with her statin and her antihypertensive but does not exercise regularly.  Past Medical History:  Diagnosis Date  . Diabetes mellitus without complication ([place])   . Hyperlipidemia   . Hypertension     Past Surgical History:  Procedure Laterality Date  . TOTAL ABDOMINAL HYSTERECTOMY  2000    No Known Allergies  Outpatient Medications Prior to Visit  Medication Sig Dispense Refill  . amLODipine (NORVASC) 10 MG tablet Take 1 tablet (10 mg total) by mouth daily. 90 tablet 3  . aspirin 81 MG tablet Take 1 tablet (81 mg total) daily by mouth. 30 tablet 5  . glipiZIDE (GLUCOTROL) 5 MG tablet Take 1 tablet (5 mg total) by mouth 2 (two) times daily before a meal. 180 tablet 3  . hydrochlorothiazide (HYDRODIURIL) 25 MG tablet Take 1 tablet (25 mg total) by mouth daily. 90 tablet 3  . isosorbide mononitrate (IMDUR) 60 MG 24 hr tablet Take 1 tablet (60 mg total) by mouth daily. 90 tablet 1  . lisinopril (PRINIVIL,ZESTRIL) 20 MG tablet Take 1 tablet (20 mg total) by mouth daily. 90 tablet 1  . simvastatin (ZOCOR) 10 MG tablet Take 1 tablet (10 mg total) by mouth daily at 6 PM. 90 tablet 3   No facility-administered  medications prior to visit.     ROS Review of Systems  Constitutional: Negative for activity change, appetite change and fatigue.  HENT: Negative for congestion, sinus pressure and sore throat.   Eyes: Negative for visual disturbance.  Respiratory: Negative for cough, chest tightness, shortness of breath and wheezing.   Cardiovascular: Negative for chest pain and palpitations.  Gastrointestinal: Negative for abdominal distention, abdominal pain and constipation.  Endocrine: Negative for polydipsia.  Genitourinary: Negative for dysuria and frequency.  Musculoskeletal:       See hpi  Skin: Negative for rash.  Neurological: Negative for tremors, light-headedness and numbness.  Hematological: Does not bruise/bleed easily.  Psychiatric/Behavioral: Negative for agitation and behavioral problems.    Objective:  BP 129/73 (BP Location: Left Arm, Patient Position: Sitting, Cuff Size: Normal)   Pulse 67   Temp 98.8 F (37.1 C) (Oral)   Resp 16   Wt 147 lb 3.2 oz (66.8 kg)   SpO2 100%   BMI 27.81 kg/m   BP/Weight 07/23/2018 04/22/2018 0/07/6760  Systolic BP 950 932 671  Diastolic BP 73 66 73  Wt. (Lbs) 147.2 146.4 144.4  BMI 27.81 27.66 27.28      Physical Exam  Constitutional: She is oriented to person, place, and time. She appears well-developed and well-nourished.  Cardiovascular: Normal rate, normal heart sounds and intact distal pulses.  No murmur heard. Pulmonary/Chest: Effort normal and breath sounds normal. She has no wheezes. She has  no rales. She exhibits no tenderness.  Abdominal: Soft. Bowel sounds are normal. She exhibits no distension and no mass. There is no tenderness.  Musculoskeletal: Normal range of motion. She exhibits no edema or tenderness (no shoulder TTP).  Neurological: She is alert and oriented to person, place, and time.  Skin: Skin is warm and dry.  Psychiatric: She has a normal mood and affect.    CMP Latest Ref Rng & Units 04/22/2018 10/17/2017  10/05/2016  Glucose 65 - 99 mg/dL 70 104(H) 112(H)  BUN 8 - 27 mg/dL 39(H) 24 50(H)  Creatinine 0.57 - 1.00 mg/dL 1.79(H) 1.49(H) 2.31(H)  Sodium 134 - 144 mmol/L 142 146(H) 141  Potassium 3.5 - 5.2 mmol/L 5.3(H) 5.5(H) 5.4(H)  Chloride 96 - 106 mmol/L 111(H) 113(H) 110  CO2 20 - 29 mmol/L 18(L) 19(L) 21  Calcium 8.7 - 10.3 mg/dL 9.1 9.3 9.2  Total Protein 6.0 - 8.5 g/dL 7.0 7.0 7.4  Total Bilirubin 0.0 - 1.2 mg/dL <0.2 0.3 0.4  Alkaline Phos 39 - 117 IU/L 55 60 54  AST 0 - 40 IU/L 19 24 14   ALT 0 - 32 IU/L 14 13 8     Lipid Panel     Component Value Date/Time   CHOL 127 04/22/2018 0906   TRIG 59 04/22/2018 0906   HDL 52 04/22/2018 0906   CHOLHDL 2.4 04/22/2018 0906   CHOLHDL 3.7 10/05/2016 1048   VLDL 19 10/05/2016 1048   LDLCALC 63 04/22/2018 0906   LDLDIRECT 40 09/09/2009 2145    Lab Results  Component Value Date   HGBA1C 5.8 (A) 07/23/2018    Assessment & Plan:   1. Type 2 diabetes mellitus with hyperglycemia, without long-term current use of insulin (HCC) Controlled with A1c of 5.8 Continue current regimen Counseled on Diabetic diet, my plate method, 762 minutes of moderate intensity exercise/week Keep blood sugar logs with fasting goals of 80-120 mg/dl, random of less than 180 and in the event of sugars less than 60 mg/dl or greater than 400 mg/dl please notify the clinic ASAP. It is recommended that you undergo annual eye exams and annual foot exams. Pneumonia vaccine is recommended. - Glucose (CBG), Fasting - HgB A1c  2. Essential hypertension, benign Controlled Continue lisinopril and isosorbide as well as HCTZ  3. Stage 3 chronic kidney disease (HCC) Last labs revealed potassium of 5.3; will repeat today Followed by Dr. Moshe Cipro - Basic Metabolic Panel  4. Muscle spasm Pain is mild and responds to use of NSAIDs  5. Health care maintenance Declines bone density, Tdap   No orders of the defined types were placed in this  encounter.   Follow-up: Return in about 6 months (around 01/23/2019) for follow up of chronic medical conditions.   Charlott Rakes MD

## 2018-07-23 NOTE — Progress Notes (Signed)
3 month f/u: DM and HTN FBS: 112 A1C-5.8

## 2018-07-23 NOTE — Patient Instructions (Signed)
Steps to Quit Smoking Smoking tobacco can be bad for your health. It can also affect almost every organ in your body. Smoking puts you and people around you at risk for many serious long-lasting (chronic) diseases. Quitting smoking is hard, but it is one of the best things that you can do for your health. It is never too late to quit. What are the benefits of quitting smoking? When you quit smoking, you lower your risk for getting serious diseases and conditions. They can include:  Lung cancer or lung disease.  Heart disease.  Stroke.  Heart attack.  Not being able to have children (infertility).  Weak bones (osteoporosis) and broken bones (fractures).  If you have coughing, wheezing, and shortness of breath, those symptoms may get better when you quit. You may also get sick less often. If you are pregnant, quitting smoking can help to lower your chances of having a baby of low birth weight. What can I do to help me quit smoking? Talk with your doctor about what can help you quit smoking. Some things you can do (strategies) include:  Quitting smoking totally, instead of slowly cutting back how much you smoke over a period of time.  Going to in-person counseling. You are more likely to quit if you go to many counseling sessions.  Using resources and support systems, such as: ? Online chats with a counselor. ? Phone quitlines. ? Printed self-help materials. ? Support groups or group counseling. ? Text messaging programs. ? Mobile phone apps or applications.  Taking medicines. Some of these medicines may have nicotine in them. If you are pregnant or breastfeeding, do not take any medicines to quit smoking unless your doctor says it is okay. Talk with your doctor about counseling or other things that can help you.  Talk with your doctor about using more than one strategy at the same time, such as taking medicines while you are also going to in-person counseling. This can help make  quitting easier. What things can I do to make it easier to quit? Quitting smoking might feel very hard at first, but there is a lot that you can do to make it easier. Take these steps:  Talk to your family and friends. Ask them to support and encourage you.  Call phone quitlines, reach out to support groups, or work with a counselor.  Ask people who smoke to not smoke around you.  Avoid places that make you want (trigger) to smoke, such as: ? Bars. ? Parties. ? Smoke-break areas at work.  Spend time with people who do not smoke.  Lower the stress in your life. Stress can make you want to smoke. Try these things to help your stress: ? Getting regular exercise. ? Deep-breathing exercises. ? Yoga. ? Meditating. ? Doing a body scan. To do this, close your eyes, focus on one area of your body at a time from head to toe, and notice which parts of your body are tense. Try to relax the muscles in those areas.  Download or buy apps on your mobile phone or tablet that can help you stick to your quit plan. There are many free apps, such as QuitGuide from the CDC (Centers for Disease Control and Prevention). You can find more support from smokefree.gov and other websites.  This information is not intended to replace advice given to you by your health care provider. Make sure you discuss any questions you have with your health care provider. Document Released: 09/16/2009 Document   Revised: 07/18/2016 Document Reviewed: 04/06/2015 Elsevier Interactive Patient Education  2018 Elsevier Inc.  

## 2018-07-24 ENCOUNTER — Other Ambulatory Visit: Payer: Self-pay | Admitting: Family Medicine

## 2018-07-24 LAB — BASIC METABOLIC PANEL
BUN / CREAT RATIO: 23 (ref 12–28)
BUN: 54 mg/dL — ABNORMAL HIGH (ref 8–27)
CO2: 17 mmol/L — ABNORMAL LOW (ref 20–29)
Calcium: 9.1 mg/dL (ref 8.7–10.3)
Chloride: 113 mmol/L — ABNORMAL HIGH (ref 96–106)
Creatinine, Ser: 2.32 mg/dL — ABNORMAL HIGH (ref 0.57–1.00)
GFR, EST AFRICAN AMERICAN: 24 mL/min/{1.73_m2} — AB (ref >59)
GFR, EST NON AFRICAN AMERICAN: 21 mL/min/{1.73_m2} — AB (ref >59)
Glucose: 119 mg/dL — ABNORMAL HIGH (ref 65–99)
POTASSIUM: 5.7 mmol/L — AB (ref 3.5–5.2)
Sodium: 147 mmol/L — ABNORMAL HIGH (ref 134–144)

## 2018-07-24 MED ORDER — SODIUM POLYSTYRENE SULFONATE 15 GM/60ML PO SUSP: 15.0000 g | Freq: Once | ORAL | 0 refills | Status: AC

## 2018-07-25 ENCOUNTER — Telehealth: Payer: Self-pay

## 2018-07-25 DIAGNOSIS — E785 Hyperlipidemia, unspecified: Secondary | ICD-10-CM

## 2018-07-25 MED ORDER — SIMVASTATIN 10 MG PO TABS: 10.0000 mg | ORAL_TABLET | Freq: Every day | ORAL | 3 refills | Status: DC

## 2018-07-25 NOTE — Telephone Encounter (Signed)
-----   Message from Charlott Rakes, MD sent at 07/24/2018  9:55 AM EDT ----- Potassium is elevated and I  have sent a script for Andrea Brown to her pharmacy. Kidney function has worsened slightly; I will need her to follow up with her   Nephrologist

## 2018-07-25 NOTE — Telephone Encounter (Signed)
Patient was called and informed of lab results. Patient needs refill on Simvastatin and the medication for her potassium was not sent to [facility]

## 2018-07-25 NOTE — Telephone Encounter (Signed)
I sent a prescription for Kayexalate to [place] yesterday.  I have refilled simvastatin.

## 2018-07-25 NOTE — Addendum Note (Signed)
Addended byCharlott Rakes on: 07/25/2018 03:58 PM   Modules accepted: Orders

## 2018-08-07 DIAGNOSIS — I129 Hypertensive chronic kidney disease with stage 1 through stage 4 chronic kidney disease, or unspecified chronic kidney disease: Secondary | ICD-10-CM | POA: Diagnosis not present

## 2018-08-07 DIAGNOSIS — E1122 Type 2 diabetes mellitus with diabetic chronic kidney disease: Secondary | ICD-10-CM | POA: Diagnosis not present

## 2018-08-07 DIAGNOSIS — N184 Chronic kidney disease, stage 4 (severe): Secondary | ICD-10-CM | POA: Diagnosis not present

## 2018-08-07 DIAGNOSIS — F17200 Nicotine dependence, unspecified, uncomplicated: Secondary | ICD-10-CM | POA: Diagnosis not present

## 2018-08-21 DIAGNOSIS — N184 Chronic kidney disease, stage 4 (severe): Secondary | ICD-10-CM | POA: Diagnosis not present

## 2018-09-02 DIAGNOSIS — F17200 Nicotine dependence, unspecified, uncomplicated: Secondary | ICD-10-CM | POA: Diagnosis not present

## 2018-09-02 DIAGNOSIS — N390 Urinary tract infection, site not specified: Secondary | ICD-10-CM | POA: Diagnosis not present

## 2018-09-02 DIAGNOSIS — E1122 Type 2 diabetes mellitus with diabetic chronic kidney disease: Secondary | ICD-10-CM | POA: Diagnosis not present

## 2018-09-02 DIAGNOSIS — N184 Chronic kidney disease, stage 4 (severe): Secondary | ICD-10-CM | POA: Diagnosis not present

## 2018-09-02 DIAGNOSIS — I129 Hypertensive chronic kidney disease with stage 1 through stage 4 chronic kidney disease, or unspecified chronic kidney disease: Secondary | ICD-10-CM | POA: Diagnosis not present

## 2018-09-30 DIAGNOSIS — N184 Chronic kidney disease, stage 4 (severe): Secondary | ICD-10-CM | POA: Diagnosis not present

## 2018-09-30 DIAGNOSIS — N390 Urinary tract infection, site not specified: Secondary | ICD-10-CM | POA: Diagnosis not present

## 2018-10-10 ENCOUNTER — Other Ambulatory Visit: Payer: Self-pay | Admitting: Family Medicine

## 2018-10-10 DIAGNOSIS — Z1231 Encounter for screening mammogram for malignant neoplasm of breast: Secondary | ICD-10-CM

## 2018-10-16 ENCOUNTER — Other Ambulatory Visit: Payer: Self-pay | Admitting: Family Medicine

## 2018-11-21 ENCOUNTER — Ambulatory Visit
Admission: RE | Admit: 2018-11-21 | Discharge: 2018-11-21 | Disposition: A | Discharge status: Home / Self Care | Payer: Medicare Other | Source: Ambulatory Visit | Attending: Family Medicine | Admitting: Family Medicine

## 2018-11-21 DIAGNOSIS — Z1231 Encounter for screening mammogram for malignant neoplasm of breast: Secondary | ICD-10-CM | POA: Diagnosis not present

## 2018-11-25 ENCOUNTER — Telehealth: Payer: Self-pay

## 2018-11-25 NOTE — Telephone Encounter (Signed)
-----   Message from Charlott Rakes, MD sent at 11/22/2018  1:52 PM EST ----- Mammogram is negative for malignancy

## 2018-11-25 NOTE — Telephone Encounter (Signed)
Patient was called and informed of lab results. 

## 2019-01-09 ENCOUNTER — Other Ambulatory Visit: Payer: Self-pay | Admitting: Family Medicine

## 2019-01-23 ENCOUNTER — Encounter: Payer: Self-pay | Admitting: Family Medicine

## 2019-01-23 ENCOUNTER — Ambulatory Visit: Payer: Medicare HMO | Attending: Family Medicine | Admitting: Family Medicine

## 2019-01-23 VITALS — BP 137/64 | HR 73 | Temp 97.6°F | Ht 61.0 in | Wt 145.8 lb

## 2019-01-23 DIAGNOSIS — E2839 Other primary ovarian failure: Secondary | ICD-10-CM | POA: Diagnosis not present

## 2019-01-23 DIAGNOSIS — I10 Essential (primary) hypertension: Secondary | ICD-10-CM | POA: Diagnosis not present

## 2019-01-23 DIAGNOSIS — E1121 Type 2 diabetes mellitus with diabetic nephropathy: Secondary | ICD-10-CM

## 2019-01-23 DIAGNOSIS — N183 Chronic kidney disease, stage 3 unspecified: Secondary | ICD-10-CM

## 2019-01-23 DIAGNOSIS — E1169 Type 2 diabetes mellitus with other specified complication: Secondary | ICD-10-CM | POA: Diagnosis not present

## 2019-01-23 LAB — POCT GLYCOSYLATED HEMOGLOBIN (HGB A1C): HbA1c, POC (controlled diabetic range): 6.3 % (ref 0.0–7.0)

## 2019-01-23 LAB — GLUCOSE, POCT (MANUAL RESULT ENTRY): POC Glucose: 114 mg/dl — AB (ref 70–99)

## 2019-01-23 MED ORDER — HYDROCHLOROTHIAZIDE 25 MG PO TABS: 25.0000 mg | ORAL_TABLET | Freq: Every day | ORAL | 1 refills | Status: DC

## 2019-01-23 MED ORDER — GLIPIZIDE 5 MG PO TABS: 5.0000 mg | ORAL_TABLET | Freq: Two times a day (BID) | ORAL | 1 refills | Status: DC

## 2019-01-23 MED ORDER — ISOSORBIDE MONONITRATE ER 60 MG PO TB24: 60.0000 mg | ORAL_TABLET | Freq: Every day | ORAL | 1 refills | Status: DC

## 2019-01-23 MED ORDER — AMLODIPINE BESYLATE 10 MG PO TABS: 10.0000 mg | ORAL_TABLET | Freq: Every day | ORAL | 1 refills | Status: DC

## 2019-01-23 NOTE — Patient Instructions (Signed)
Diabetes Mellitus and Nutrition, Adult  When you have diabetes (diabetes mellitus), it is very important to have healthy eating habits because your blood sugar (glucose) levels are greatly affected by what you eat and drink. Eating healthy foods in the appropriate amounts, at about the same times every day, can help you:  · Control your blood glucose.  · Lower your risk of heart disease.  · Improve your blood pressure.  · Reach or maintain a healthy weight.  Every person with diabetes is different, and each person has different needs for a meal plan. Your health care provider may recommend that you work with a diet and nutrition specialist (dietitian) to make a meal plan that is best for you. Your meal plan may vary depending on factors such as:  · The calories you need.  · The medicines you take.  · Your weight.  · Your blood glucose, blood pressure, and cholesterol levels.  · Your activity level.  · Other health conditions you have, such as heart or kidney disease.  How do carbohydrates affect me?  Carbohydrates, also called carbs, affect your blood glucose level more than any other type of food. Eating carbs naturally raises the amount of glucose in your blood. Carb counting is a method for keeping track of how many carbs you eat. Counting carbs is important to keep your blood glucose at a healthy level, especially if you use insulin or take certain oral diabetes medicines.  It is important to know how many carbs you can safely have in each meal. This is different for every person. Your dietitian can help you calculate how many carbs you should have at each meal and for each snack.  Foods that contain carbs include:  · Bread, cereal, rice, pasta, and crackers.  · Potatoes and corn.  · Peas, beans, and lentils.  · Milk and yogurt.  · Fruit and juice.  · Desserts, such as cakes, cookies, ice cream, and candy.  How does alcohol affect me?  Alcohol can cause a sudden decrease in blood glucose (hypoglycemia),  especially if you use insulin or take certain oral diabetes medicines. Hypoglycemia can be a life-threatening condition. Symptoms of hypoglycemia (sleepiness, dizziness, and confusion) are similar to symptoms of having too much alcohol.  If your health care provider says that alcohol is safe for you, follow these guidelines:  · Limit alcohol intake to no more than 1 drink per day for nonpregnant women and 2 drinks per day for men. One drink equals 12 oz of beer, 5 oz of wine, or 1½ oz of hard liquor.  · Do not drink on an empty stomach.  · Keep yourself hydrated with water, diet soda, or unsweetened iced tea.  · Keep in mind that regular soda, juice, and other mixers may contain a lot of sugar and must be counted as carbs.  What are tips for following this plan?    Reading food labels  · Start by checking the serving size on the "Nutrition Facts" label of packaged foods and drinks. The amount of calories, carbs, fats, and other nutrients listed on the label is based on one serving of the item. Many items contain more than one serving per package.  · Check the total grams (g) of carbs in one serving. You can calculate the number of servings of carbs in one serving by dividing the total carbs by 15. For example, if a food has 30 g of total carbs, it would be equal to 2   servings of carbs.  · Check the number of grams (g) of saturated and trans fats in one serving. Choose foods that have low or no amount of these fats.  · Check the number of milligrams (mg) of salt (sodium) in one serving. Most people should limit total sodium intake to less than 2,300 mg per day.  · Always check the nutrition information of foods labeled as "low-fat" or "nonfat". These foods may be higher in added sugar or refined carbs and should be avoided.  · Talk to your dietitian to identify your daily goals for nutrients listed on the label.  Shopping  · Avoid buying canned, premade, or processed foods. These foods tend to be high in fat, sodium,  and added sugar.  · Shop around the outside edge of the grocery store. This includes fresh fruits and vegetables, bulk grains, fresh meats, and fresh dairy.  Cooking  · Use low-heat cooking methods, such as baking, instead of high-heat cooking methods like deep frying.  · Cook using healthy oils, such as olive, canola, or sunflower oil.  · Avoid cooking with butter, cream, or high-fat meats.  Meal planning  · Eat meals and snacks regularly, preferably at the same times every day. Avoid going long periods of time without eating.  · Eat foods high in fiber, such as fresh fruits, vegetables, beans, and whole grains. Talk to your dietitian about how many servings of carbs you can eat at each meal.  · Eat 4-6 ounces (oz) of lean protein each day, such as lean meat, chicken, fish, eggs, or tofu. One oz of lean protein is equal to:  ? 1 oz of meat, chicken, or fish.  ? 1 egg.  ? ¼ cup of tofu.  · Eat some foods each day that contain healthy fats, such as avocado, nuts, seeds, and fish.  Lifestyle  · Check your blood glucose regularly.  · Exercise regularly as told by your health care provider. This may include:  ? 150 minutes of moderate-intensity or vigorous-intensity exercise each week. This could be brisk walking, biking, or water aerobics.  ? Stretching and doing strength exercises, such as yoga or weightlifting, at least 2 times a week.  · Take medicines as told by your health care provider.  · Do not use any products that contain nicotine or tobacco, such as cigarettes and e-cigarettes. If you need help quitting, ask your health care provider.  · Work with a counselor or diabetes educator to identify strategies to manage stress and any emotional and social challenges.  Questions to ask a health care provider  · Do I need to meet with a diabetes educator?  · Do I need to meet with a dietitian?  · What number can I call if I have questions?  · When are the best times to check my blood glucose?  Where to find more  information:  · American Diabetes Association: diabetes.org  · Academy of Nutrition and Dietetics: www.eatright.org  · National Institute of Diabetes and Digestive and Kidney Diseases (NIH): www.niddk.nih.gov  Summary  · A healthy meal plan will help you control your blood glucose and maintain a healthy lifestyle.  · Working with a diet and nutrition specialist (dietitian) can help you make a meal plan that is best for you.  · Keep in mind that carbohydrates (carbs) and alcohol have immediate effects on your blood glucose levels. It is important to count carbs and to use alcohol carefully.  This information is not intended to   replace advice given to you by your health care provider. Make sure you discuss any questions you have with your health care provider.  Document Released: 08/17/2005 Document Revised: 06/20/2017 Document Reviewed: 12/25/2016  Elsevier Interactive Patient Education © 2019 Elsevier Inc.

## 2019-01-23 NOTE — Progress Notes (Signed)
Subjective:  Patient ID: Andrea Brown, female    DOB: 20-Mar-1951  Age: 68 y.o. MRN: [id number]  CC: Hypertension and Diabetes   HPI Andrea Brown  is a 68 year old female with a history of type 2 diabetes mellitus (A1c 6.3), hypertension, dyslipidemia, stage III chronic kidney disease, tobacco abuse here for follow-up visit. Her chronic kidney disease is managed by nephrology at [facility], Dr. Moshe Cipro. She is doing well on her antihypertensive and denies adverse effects from her medications.  Diabetes is controlled on glipizide and she denies hypoglycemia, numbness in extremities. She is on a statin and denies myalgias. She has no acute concerns today.  Past Medical History:  Diagnosis Date  . Diabetes mellitus without complication ([place])   . Hyperlipidemia   . Hypertension     Past Surgical History:  Procedure Laterality Date  . TOTAL ABDOMINAL HYSTERECTOMY  2000    Family History  Problem Relation Age of Onset  . Diabetes Mother   . Hypertension Mother   . Colon cancer Neg Hx   . Esophageal cancer Neg Hx   . Pancreatic cancer Neg Hx   . Rectal cancer Neg Hx   . Stomach cancer Neg Hx     No Known Allergies  Outpatient Medications Prior to Visit  Medication Sig Dispense Refill  . aspirin 81 MG tablet Take 1 tablet (81 mg total) daily by mouth. 30 tablet 5  . simvastatin (ZOCOR) 10 MG tablet Take 1 tablet (10 mg total) by mouth daily at 6 PM. 90 tablet 3  . amLODipine (NORVASC) 10 MG tablet Take 1 tablet (10 mg total) by mouth daily. 90 tablet 3  . glipiZIDE (GLUCOTROL) 5 MG tablet Take 1 tablet (5 mg total) by mouth 2 (two) times daily before a meal. 180 tablet 3  . hydrochlorothiazide (HYDRODIURIL) 25 MG tablet Take 1 tablet (25 mg total) by mouth daily. 90 tablet 3  . isosorbide mononitrate (IMDUR) 60 MG 24 hr tablet TAKE 1 TABLET EVERY DAY 90 tablet 0  . lisinopril (PRINIVIL,ZESTRIL) 20 MG tablet Take 1 tablet (20 mg total) by mouth daily. (Patient  not taking: Reported on 01/23/2019) 90 tablet 1   No facility-administered medications prior to visit.      ROS Review of Systems  Objective:  BP 137/64   Pulse 73   Temp 97.6 F (36.4 C) (Oral)   Ht _0  (1.549 m)   Wt 145 lb 12.8 oz (66.1 kg)   SpO2 99%   BMI 27.55 kg/m   BP/Weight 01/23/2019 07/23/2018 06/28/2034  Systolic BP 597 416 384  Diastolic BP 64 73 66  Wt. (Lbs) 145.8 147.2 146.4  BMI 27.55 27.81 27.66      Physical Exam Constitutional: normal appearing,  Eyes: PERRLA HEENT: Head is atraumatic, normal sinuses, normal oropharynx, normal appearing tonsils and palate, tympanic membrane is normal bilaterally. Neck: normal range of motion, no thyromegaly, no JVD Cardiovascular: normal rate and rhythm, normal heart sounds, no murmurs, rub or gallop, no pedal edema Respiratory: Normal breath sounds, clear to auscultation bilaterally, no wheezes, no rales, no rhonchi Abdomen: soft, not tender to palpation, normal bowel sounds, no enlarged organs Musculoskeletal: Full ROM, no tenderness in joints Skin: warm and dry, no lesions. Neurological: alert, oriented x3, cranial nerves I-XII grossly intact , normal motor strength, normal sensation. Psychological: normal mood.   CMP Latest Ref Rng & Units 07/23/2018 04/22/2018 10/17/2017  Glucose 65 - 99 mg/dL 119(H) 70 104(H)  BUN 8 - 27  mg/dL 54(H) 39(H) 24  Creatinine 0.57 - 1.00 mg/dL 2.32(H) 1.79(H) 1.49(H)  Sodium 134 - 144 mmol/L 147(H) 142 146(H)  Potassium 3.5 - 5.2 mmol/L 5.7(H) 5.3(H) 5.5(H)  Chloride 96 - 106 mmol/L 113(H) 111(H) 113(H)  CO2 20 - 29 mmol/L 17(L) 18(L) 19(L)  Calcium 8.7 - 10.3 mg/dL 9.1 9.1 9.3  Total Protein 6.0 - 8.5 g/dL - 7.0 7.0  Total Bilirubin 0.0 - 1.2 mg/dL - <0.2 0.3  Alkaline Phos 39 - 117 IU/L - 55 60  AST 0 - 40 IU/L - 19 24  ALT 0 - 32 IU/L - 14 13    Lipid Panel     Component Value Date/Time   CHOL 127 04/22/2018 0906   TRIG 59 04/22/2018 0906   HDL 52 04/22/2018 0906    CHOLHDL 2.4 04/22/2018 0906   CHOLHDL 3.7 10/05/2016 1048   VLDL 19 10/05/2016 1048   LDLCALC 63 04/22/2018 0906   LDLDIRECT 40 09/09/2009 2145    CBC    Component Value Date/Time   WBC 4.3 10/17/2017 0933   WBC 5.5 10/05/2016 1048   RBC 3.31 (L) 10/17/2017 0933   RBC 3.97 10/05/2016 1048   HGB 9.4 (L) 10/17/2017 0933   HCT 28.8 (L) 10/17/2017 0933   PLT 191 10/17/2017 0933   MCV 87 10/17/2017 0933   MCH 28.4 10/17/2017 0933   MCH 28.2 10/05/2016 1048   MCHC 32.6 10/17/2017 0933   MCHC 32.7 10/05/2016 1048   RDW 14.8 10/17/2017 0933   LYMPHSABS 1.3 10/17/2017 0933   MONOABS 275 10/05/2016 1048   EOSABS 0.1 10/17/2017 0933   BASOSABS 0.0 10/17/2017 0933    Lab Results  Component Value Date   HGBA1C 6.3 01/23/2019    Assessment & Plan:   1. Type 2 diabetes mellitus with other specified complication, without long-term current use of insulin (HCC) Controlled with A1c of 6.3 Will address eye exam and Tdap at next visit. - POCT glucose (manual entry) - POCT glycosylated hemoglobin (Hb A1C) - Microalbumin/Creatinine Ratio, Urine  2. Essential hypertension - amLODipine (NORVASC) 10 MG tablet; Take 1 tablet (10 mg total) by mouth daily.  Dispense: 90 tablet; Refill: 1 - hydrochlorothiazide (HYDRODIURIL) 25 MG tablet; Take 1 tablet (25 mg total) by mouth daily.  Dispense: 90 tablet; Refill: 1 - CMP14+EGFR - Lipid panel  3. Type 2 diabetes mellitus with diabetic nephropathy, without long-term current use of insulin (HCC) See #1 above - glipiZIDE (GLUCOTROL) 5 MG tablet; Take 1 tablet (5 mg total) by mouth 2 (two) times daily before a meal.  Dispense: 180 tablet; Refill: 1  4. Estrogen deficiency - DG Bone Density; Future  5. Stage 3 chronic kidney disease (HCC) Combination of hypertensive and diabetic nephropathy Followed by [facility]   Meds ordered this encounter  Medications  . amLODipine (NORVASC) 10 MG tablet    Sig: Take 1 tablet (10 mg total)  by mouth daily.    Dispense:  90 tablet    Refill:  1  . glipiZIDE (GLUCOTROL) 5 MG tablet    Sig: Take 1 tablet (5 mg total) by mouth 2 (two) times daily before a meal.    Dispense:  180 tablet    Refill:  1  . hydrochlorothiazide (HYDRODIURIL) 25 MG tablet    Sig: Take 1 tablet (25 mg total) by mouth daily.    Dispense:  90 tablet    Refill:  1  . isosorbide mononitrate (IMDUR) 60 MG 24 hr tablet  Sig: Take 1 tablet (60 mg total) by mouth daily.    Dispense:  90 tablet    Refill:  1    Follow-up: Return in about 6 months (around 07/24/2019) for follow up of chronic medical conditions.       Charlott Rakes, MD, FAAFP. [facility] [place], [place]   01/23/2019, 10:12 AM

## 2019-01-24 LAB — CMP14+EGFR
ALK PHOS: 63 IU/L (ref 39–117)
ALT: 17 IU/L (ref 0–32)
AST: 24 IU/L (ref 0–40)
Albumin/Globulin Ratio: 1.6 (ref 1.2–2.2)
Albumin: 4.5 g/dL (ref 3.8–4.8)
BUN / CREAT RATIO: 18 (ref 12–28)
BUN: 30 mg/dL — ABNORMAL HIGH (ref 8–27)
Bilirubin Total: 0.3 mg/dL (ref 0.0–1.2)
CO2: 23 mmol/L (ref 20–29)
Calcium: 9.4 mg/dL (ref 8.7–10.3)
Chloride: 106 mmol/L (ref 96–106)
Creatinine, Ser: 1.69 mg/dL — ABNORMAL HIGH (ref 0.57–1.00)
GFR calc Af Amer: 35 mL/min/{1.73_m2} — ABNORMAL LOW (ref >59)
GFR calc non Af Amer: 31 mL/min/{1.73_m2} — ABNORMAL LOW (ref >59)
Globulin, Total: 2.8 g/dL (ref 1.5–4.5)
Glucose: 121 mg/dL — ABNORMAL HIGH (ref 65–99)
Potassium: 4.5 mmol/L (ref 3.5–5.2)
Sodium: 144 mmol/L (ref 134–144)
Total Protein: 7.3 g/dL (ref 6.0–8.5)

## 2019-01-24 LAB — LIPID PANEL
CHOLESTEROL TOTAL: 136 mg/dL (ref 100–199)
Chol/HDL Ratio: 2.5 ratio (ref 0.0–4.4)
HDL: 54 mg/dL (ref >39)
LDL Calculated: 70 mg/dL (ref 0–99)
TRIGLYCERIDES: 59 mg/dL (ref 0–149)
VLDL Cholesterol Cal: 12 mg/dL (ref 5–40)

## 2019-02-04 ENCOUNTER — Telehealth: Payer: Self-pay

## 2019-02-04 NOTE — Telephone Encounter (Signed)
-----   Message from Charlott Rakes, MD sent at 01/24/2019  2:43 PM EST ----- Cholesterol is normal, kidney functions show a decline with slight improvement compared to previous labs.  Continue current management

## 2019-02-04 NOTE — Telephone Encounter (Signed)
Patient was called and informed of lab results. 

## 2019-03-18 DIAGNOSIS — N184 Chronic kidney disease, stage 4 (severe): Secondary | ICD-10-CM | POA: Diagnosis not present

## 2019-03-18 DIAGNOSIS — F17200 Nicotine dependence, unspecified, uncomplicated: Secondary | ICD-10-CM | POA: Diagnosis not present

## 2019-03-18 DIAGNOSIS — I129 Hypertensive chronic kidney disease with stage 1 through stage 4 chronic kidney disease, or unspecified chronic kidney disease: Secondary | ICD-10-CM | POA: Diagnosis not present

## 2019-03-18 DIAGNOSIS — E1122 Type 2 diabetes mellitus with diabetic chronic kidney disease: Secondary | ICD-10-CM | POA: Diagnosis not present

## 2019-03-18 DIAGNOSIS — N390 Urinary tract infection, site not specified: Secondary | ICD-10-CM | POA: Diagnosis not present

## 2019-04-04 DIAGNOSIS — N390 Urinary tract infection, site not specified: Secondary | ICD-10-CM | POA: Diagnosis not present

## 2019-04-04 DIAGNOSIS — N184 Chronic kidney disease, stage 4 (severe): Secondary | ICD-10-CM | POA: Diagnosis not present

## 2019-09-04 DIAGNOSIS — I129 Hypertensive chronic kidney disease with stage 1 through stage 4 chronic kidney disease, or unspecified chronic kidney disease: Secondary | ICD-10-CM | POA: Diagnosis not present

## 2019-09-04 DIAGNOSIS — E1122 Type 2 diabetes mellitus with diabetic chronic kidney disease: Secondary | ICD-10-CM | POA: Diagnosis not present

## 2019-09-04 DIAGNOSIS — F17200 Nicotine dependence, unspecified, uncomplicated: Secondary | ICD-10-CM | POA: Diagnosis not present

## 2019-09-04 DIAGNOSIS — N390 Urinary tract infection, site not specified: Secondary | ICD-10-CM | POA: Diagnosis not present

## 2019-09-04 DIAGNOSIS — N184 Chronic kidney disease, stage 4 (severe): Secondary | ICD-10-CM | POA: Diagnosis not present

## 2019-09-10 ENCOUNTER — Other Ambulatory Visit: Payer: Self-pay

## 2019-09-10 ENCOUNTER — Ambulatory Visit: Payer: Medicare HMO | Attending: Family Medicine | Admitting: Family Medicine

## 2019-09-10 ENCOUNTER — Encounter: Payer: Self-pay | Admitting: Family Medicine

## 2019-09-10 DIAGNOSIS — E1121 Type 2 diabetes mellitus with diabetic nephropathy: Secondary | ICD-10-CM

## 2019-09-10 DIAGNOSIS — I10 Essential (primary) hypertension: Secondary | ICD-10-CM

## 2019-09-10 DIAGNOSIS — E785 Hyperlipidemia, unspecified: Secondary | ICD-10-CM

## 2019-09-10 DIAGNOSIS — F17210 Nicotine dependence, cigarettes, uncomplicated: Secondary | ICD-10-CM | POA: Diagnosis not present

## 2019-09-10 DIAGNOSIS — N184 Chronic kidney disease, stage 4 (severe): Secondary | ICD-10-CM | POA: Diagnosis not present

## 2019-09-10 DIAGNOSIS — I129 Hypertensive chronic kidney disease with stage 1 through stage 4 chronic kidney disease, or unspecified chronic kidney disease: Secondary | ICD-10-CM | POA: Diagnosis not present

## 2019-09-10 DIAGNOSIS — Z720 Tobacco use: Secondary | ICD-10-CM

## 2019-09-10 MED ORDER — AMLODIPINE BESYLATE 10 MG PO TABS: 10.0000 mg | ORAL_TABLET | Freq: Every day | ORAL | 1 refills | Status: DC

## 2019-09-10 MED ORDER — SIMVASTATIN 10 MG PO TABS: 10.0000 mg | ORAL_TABLET | Freq: Every day | ORAL | 3 refills | Status: DC

## 2019-09-10 MED ORDER — GLIPIZIDE 5 MG PO TABS: 5.0000 mg | ORAL_TABLET | Freq: Two times a day (BID) | ORAL | 1 refills | Status: DC

## 2019-09-10 MED ORDER — HYDROCHLOROTHIAZIDE 25 MG PO TABS: 25.0000 mg | ORAL_TABLET | Freq: Every day | ORAL | 1 refills | Status: DC

## 2019-09-10 MED ORDER — ISOSORBIDE MONONITRATE ER 60 MG PO TB24: 60.0000 mg | ORAL_TABLET | Freq: Every day | ORAL | 1 refills | Status: DC

## 2019-09-10 MED ORDER — MISC. DEVICES MISC: 0 refills | Status: AC

## 2019-09-10 NOTE — Progress Notes (Signed)
Virtual Visit via Telephone Note  I connected with Andrea Brown, on 09/10/2019 at 8:38 AM by telephone due to the COVID-19 pandemic and verified that I am speaking with the correct person using two identifiers.   Consent: I discussed the limitations, risks, security and privacy concerns of performing an evaluation and management service by telephone and the availability of in person appointments. I also discussed with the patient that there may be a patient responsible charge related to this service. The patient expressed understanding and agreed to proceed.   Location of Patient: Home  Location of Provider: Clinic   Persons participating in Telemedicine visit: [name] Dr. Felecia Shelling     History of Present Illness: Andrea Brown  is a 68 year old female with a history of type 2 diabetes mellitus (A1c 6.3), hypertension, dyslipidemia, stage III-IV chronic kidney disease, tobacco abuse here for follow-up visit. Her systolic BP was 235 at her Nephrology visit but she has no means of checking it at home.  Her chronic kidney disease is managed by Dr. Moshe Cipro and she has been compliant with her follow-up visits. Exercises regularly by walking every day. Fasting sugars have been between 97-124.  Denies hypoglycemia, visual concerns or numbness in extremities. She is tolerating her statin with no complains  She continues to smoke about 3 cigarrettes and is not ready to quit.  Past Medical History:  Diagnosis Date  . Diabetes mellitus without complication ([place])   . Hyperlipidemia   . Hypertension    No Known Allergies  Current Outpatient Medications on File Prior to Visit  Medication Sig Dispense Refill  . amLODipine (NORVASC) 10 MG tablet Take 1 tablet (10 mg total) by mouth daily. 90 tablet 1  . aspirin 81 MG tablet Take 1 tablet (81 mg total) daily by mouth. 30 tablet 5  . glipiZIDE (GLUCOTROL) 5 MG tablet Take 1 tablet (5 mg total) by mouth 2 (two)  times daily before a meal. 180 tablet 1  . hydrochlorothiazide (HYDRODIURIL) 25 MG tablet Take 1 tablet (25 mg total) by mouth daily. 90 tablet 1  . isosorbide mononitrate (IMDUR) 60 MG 24 hr tablet Take 1 tablet (60 mg total) by mouth daily. 90 tablet 1  . simvastatin (ZOCOR) 10 MG tablet Take 1 tablet (10 mg total) by mouth daily at 6 PM. 90 tablet 3   No current facility-administered medications on file prior to visit.     Observations/Objective: Awake, alert, oriented x3 Not in acute distress  CMP Latest Ref Rng & Units 01/23/2019 07/23/2018 04/22/2018  Glucose 65 - 99 mg/dL 121(H) 119(H) 70  BUN 8 - 27 mg/dL 30(H) 54(H) 39(H)  Creatinine 0.57 - 1.00 mg/dL 1.69(H) 2.32(H) 1.79(H)  Sodium 134 - 144 mmol/L 144 147(H) 142  Potassium 3.5 - 5.2 mmol/L 4.5 5.7(H) 5.3(H)  Chloride 96 - 106 mmol/L 106 113(H) 111(H)  CO2 20 - 29 mmol/L 23 17(L) 18(L)  Calcium 8.7 - 10.3 mg/dL 9.4 9.1 9.1  Total Protein 6.0 - 8.5 g/dL 7.3 - 7.0  Total Bilirubin 0.0 - 1.2 mg/dL 0.3 - <0.2  Alkaline Phos 39 - 117 IU/L 63 - 55  AST 0 - 40 IU/L 24 - 19  ALT 0 - 32 IU/L 17 - 14    Lipid Panel     Component Value Date/Time   CHOL 136 01/23/2019 1029   TRIG 59 01/23/2019 1029   HDL 54 01/23/2019 1029   CHOLHDL 2.5 01/23/2019 1029   CHOLHDL 3.7 10/05/2016 1048  VLDL 19 10/05/2016 1048   LDLCALC 70 01/23/2019 1029   LDLDIRECT 40 09/09/2009 2145   LABVLDL 12 01/23/2019 1029    Lab Results  Component Value Date   HGBA1C 6.3 01/23/2019    Assessment and Plan: 1. Essential hypertension Controlled Prescription for blood pressure monitor written Counseled on blood pressure goal of less than 130/80, low-sodium, DASH diet, medication compliance, 150 minutes of moderate intensity exercise per week. Discussed medication compliance, adverse effects. - CMP14+EGFR; Future - Lipid panel; Future - amLODipine (NORVASC) 10 MG tablet; Take 1 tablet (10 mg total) by mouth daily.  Dispense: 90 tablet; Refill: 1 -  hydrochlorothiazide (HYDRODIURIL) 25 MG tablet; Take 1 tablet (25 mg total) by mouth daily.  Dispense: 90 tablet; Refill: 1  2. Type 2 diabetes mellitus with diabetic nephropathy, without long-term current use of insulin (HCC) Controlled with A1c of 6.3 Continue current regimen as she does not have hypoglycemic symptoms She is due for Pneumovax Counseled on Diabetic diet, my plate method, 109 minutes of moderate intensity exercise/week Blood sugar logs with fasting goals of 80-120 mg/dl, random of less than 180 and in the event of sugars less than 60 mg/dl or greater than 400 mg/dl encouraged to notify the clinic. Advised on the need for annual eye exams, annual foot exams, Pneumonia vaccine. - Microalbumin / creatinine urine ratio; Future - glipiZIDE (GLUCOTROL) 5 MG tablet; Take 1 tablet (5 mg total) by mouth 2 (two) times daily before a meal.  Dispense: 180 tablet; Refill: 1  3. Dyslipidemia Controlled Low-cholesterol diet - simvastatin (ZOCOR) 10 MG tablet; Take 1 tablet (10 mg total) by mouth daily at 6 PM.  Dispense: 90 tablet; Refill: 3  4. Chronic kidney disease, stage IV (severe) (HCC) Combination of diabetic and hypertensive nephropathy Avoid nephrotoxins Followed by [facility]-Dr. Moshe Cipro  5. Tobacco abuse Spent 3 minutes counseling on smoking cessation and she is not ready to quit She declines initiation of pharmacotherapy   Follow Up Instructions: Return in about 6 months (around 03/10/2020) for Chronic medical conditions; fasting labs and Pneumovax 23 at next visit.    I discussed the assessment and treatment plan with the patient. The patient was provided an opportunity to ask questions and all were answered. The patient agreed with the plan and demonstrated an understanding of the instructions.   The patient was advised to call back or seek an in-person evaluation if the symptoms worsen or if the condition fails to improve as anticipated.     I provided  16 minutes total of non-face-to-face time during this encounter including median intraservice time, reviewing previous notes, labs, imaging, medications, management and patient verbalized understanding.     Charlott Rakes, MD, FAAFP. [facility] [place], [place]   09/10/2019, 8:38 AM

## 2019-09-10 NOTE — Progress Notes (Signed)
Patient has been called and DOB has been verified. Patient has been screened and transferred to PCP to start phone visit.   Patient needs refills on all medications.

## 2019-09-11 ENCOUNTER — Other Ambulatory Visit: Payer: Self-pay

## 2019-09-11 ENCOUNTER — Ambulatory Visit: Payer: Medicare HMO | Attending: Family Medicine

## 2019-09-11 DIAGNOSIS — I10 Essential (primary) hypertension: Secondary | ICD-10-CM | POA: Diagnosis not present

## 2019-09-11 DIAGNOSIS — E1121 Type 2 diabetes mellitus with diabetic nephropathy: Secondary | ICD-10-CM | POA: Diagnosis not present

## 2019-09-12 LAB — CMP14+EGFR
ALT: 13 IU/L (ref 0–32)
AST: 15 IU/L (ref 0–40)
Albumin/Globulin Ratio: 1.7 (ref 1.2–2.2)
Albumin: 4.3 g/dL (ref 3.8–4.8)
Alkaline Phosphatase: 66 IU/L (ref 39–117)
BUN/Creatinine Ratio: 20 (ref 12–28)
BUN: 41 mg/dL — ABNORMAL HIGH (ref 8–27)
Bilirubin Total: 0.3 mg/dL (ref 0.0–1.2)
CO2: 26 mmol/L (ref 20–29)
Calcium: 9.6 mg/dL (ref 8.7–10.3)
Chloride: 100 mmol/L (ref 96–106)
Creatinine, Ser: 2.05 mg/dL — ABNORMAL HIGH (ref 0.57–1.00)
GFR calc Af Amer: 28 mL/min/{1.73_m2} — ABNORMAL LOW (ref >59)
GFR calc non Af Amer: 24 mL/min/{1.73_m2} — ABNORMAL LOW (ref >59)
Globulin, Total: 2.6 g/dL (ref 1.5–4.5)
Glucose: 292 mg/dL — ABNORMAL HIGH (ref 65–99)
Potassium: 4.3 mmol/L (ref 3.5–5.2)
Sodium: 139 mmol/L (ref 134–144)
Total Protein: 6.9 g/dL (ref 6.0–8.5)

## 2019-09-12 LAB — LIPID PANEL
Chol/HDL Ratio: 2.5 ratio (ref 0.0–4.4)
Cholesterol, Total: 145 mg/dL (ref 100–199)
HDL: 58 mg/dL (ref >39)
LDL Chol Calc (NIH): 72 mg/dL (ref 0–99)
Triglycerides: 78 mg/dL (ref 0–149)
VLDL Cholesterol Cal: 15 mg/dL (ref 5–40)

## 2019-09-12 LAB — MICROALBUMIN / CREATININE URINE RATIO
Creatinine, Urine: 74.5 mg/dL
Microalb/Creat Ratio: 25 mg/g creat (ref 0–29)
Microalbumin, Urine: 18.5 ug/mL

## 2019-09-15 ENCOUNTER — Telehealth: Payer: Self-pay

## 2019-09-15 NOTE — Telephone Encounter (Signed)
Patient name and DOB has been verified Patient was informed of lab results. Patient had no questions.  

## 2019-09-15 NOTE — Telephone Encounter (Signed)
-----   Message from Charlott Rakes, MD sent at 09/12/2019  9:37 AM EDT ----- Cholesterol is normal, kidney function is abnormal and has slightly worsened compared to previous labs.  Please encourage to keep appointment with her nephrologist.

## 2019-10-15 ENCOUNTER — Other Ambulatory Visit: Payer: Self-pay | Admitting: Family Medicine

## 2019-10-15 DIAGNOSIS — Z1231 Encounter for screening mammogram for malignant neoplasm of breast: Secondary | ICD-10-CM

## 2019-11-25 ENCOUNTER — Ambulatory Visit
Admission: RE | Admit: 2019-11-25 | Discharge: 2019-11-25 | Disposition: A | Discharge status: Home / Self Care | Payer: Medicare HMO | Source: Ambulatory Visit | Attending: Family Medicine | Admitting: Family Medicine

## 2019-11-25 ENCOUNTER — Other Ambulatory Visit: Payer: Self-pay

## 2019-11-25 DIAGNOSIS — Z1231 Encounter for screening mammogram for malignant neoplasm of breast: Secondary | ICD-10-CM

## 2019-11-26 ENCOUNTER — Ambulatory Visit: Payer: Medicare HMO

## 2019-11-26 ENCOUNTER — Telehealth: Payer: Self-pay

## 2019-11-26 ENCOUNTER — Other Ambulatory Visit: Payer: Self-pay | Admitting: Family Medicine

## 2019-11-26 DIAGNOSIS — R928 Other abnormal and inconclusive findings on diagnostic imaging of breast: Secondary | ICD-10-CM

## 2019-11-26 NOTE — Telephone Encounter (Signed)
Patient name and DOB has been verified Patient was informed of lab results. Patient had no questions.  Patient was informed that the breast center will be in touch with her.

## 2019-11-26 NOTE — Telephone Encounter (Signed)
-----   Message from Charlott Rakes, MD sent at 11/26/2019  6:42 AM EST ----- Mammogram reveal breast calcifications requiring additional studies and the breast center will be in touch with her to schedule this

## 2019-12-10 ENCOUNTER — Ambulatory Visit
Admission: RE | Admit: 2019-12-10 | Discharge: 2019-12-10 | Disposition: A | Discharge status: Home / Self Care | Payer: Medicare HMO | Source: Ambulatory Visit | Attending: Family Medicine | Admitting: Family Medicine

## 2019-12-10 ENCOUNTER — Other Ambulatory Visit: Payer: Self-pay | Admitting: Family Medicine

## 2019-12-10 ENCOUNTER — Other Ambulatory Visit: Payer: Self-pay

## 2019-12-10 DIAGNOSIS — R921 Mammographic calcification found on diagnostic imaging of breast: Secondary | ICD-10-CM

## 2019-12-10 DIAGNOSIS — R928 Other abnormal and inconclusive findings on diagnostic imaging of breast: Secondary | ICD-10-CM

## 2019-12-19 ENCOUNTER — Other Ambulatory Visit: Payer: Self-pay | Admitting: Family Medicine

## 2019-12-19 ENCOUNTER — Ambulatory Visit
Admission: RE | Admit: 2019-12-19 | Discharge: 2019-12-19 | Disposition: A | Discharge status: Home / Self Care | Payer: Medicare HMO | Source: Ambulatory Visit | Attending: Family Medicine | Admitting: Family Medicine

## 2019-12-19 ENCOUNTER — Other Ambulatory Visit: Payer: Self-pay

## 2019-12-19 DIAGNOSIS — R921 Mammographic calcification found on diagnostic imaging of breast: Secondary | ICD-10-CM

## 2019-12-19 DIAGNOSIS — N6313 Unspecified lump in the right breast, lower outer quadrant: Secondary | ICD-10-CM | POA: Diagnosis not present

## 2019-12-19 DIAGNOSIS — D241 Benign neoplasm of right breast: Secondary | ICD-10-CM | POA: Diagnosis not present

## 2019-12-19 DIAGNOSIS — N6311 Unspecified lump in the right breast, upper outer quadrant: Secondary | ICD-10-CM | POA: Diagnosis not present

## 2019-12-23 ENCOUNTER — Other Ambulatory Visit: Payer: Self-pay | Admitting: Family Medicine

## 2019-12-23 DIAGNOSIS — R921 Mammographic calcification found on diagnostic imaging of breast: Secondary | ICD-10-CM

## 2019-12-31 ENCOUNTER — Ambulatory Visit
Admission: RE | Admit: 2019-12-31 | Discharge: 2019-12-31 | Disposition: A | Discharge status: Home / Self Care | Payer: Medicare HMO | Source: Ambulatory Visit | Attending: Family Medicine | Admitting: Family Medicine

## 2019-12-31 ENCOUNTER — Other Ambulatory Visit: Payer: Self-pay

## 2019-12-31 ENCOUNTER — Other Ambulatory Visit: Payer: Self-pay | Admitting: Family Medicine

## 2019-12-31 DIAGNOSIS — R921 Mammographic calcification found on diagnostic imaging of breast: Secondary | ICD-10-CM | POA: Diagnosis not present

## 2019-12-31 DIAGNOSIS — D241 Benign neoplasm of right breast: Secondary | ICD-10-CM | POA: Diagnosis not present

## 2019-12-31 DIAGNOSIS — N6311 Unspecified lump in the right breast, upper outer quadrant: Secondary | ICD-10-CM | POA: Diagnosis not present

## 2020-01-07 ENCOUNTER — Ambulatory Visit: Payer: Self-pay | Admitting: General Surgery

## 2020-01-07 DIAGNOSIS — D241 Benign neoplasm of right breast: Secondary | ICD-10-CM

## 2020-01-23 ENCOUNTER — Other Ambulatory Visit: Payer: Self-pay | Admitting: General Surgery

## 2020-01-23 DIAGNOSIS — D241 Benign neoplasm of right breast: Secondary | ICD-10-CM

## 2020-02-13 ENCOUNTER — Other Ambulatory Visit: Payer: Self-pay

## 2020-02-13 ENCOUNTER — Encounter (HOSPITAL_BASED_OUTPATIENT_CLINIC_OR_DEPARTMENT_OTHER): Payer: Self-pay | Admitting: General Surgery

## 2020-02-19 ENCOUNTER — Other Ambulatory Visit (HOSPITAL_COMMUNITY)
Admission: RE | Admit: 2020-02-19 | Discharge: 2020-02-19 | Disposition: A | Discharge status: Home / Self Care | Payer: Medicare HMO | Source: Ambulatory Visit | Attending: General Surgery | Admitting: General Surgery

## 2020-02-19 DIAGNOSIS — Z01812 Encounter for preprocedural laboratory examination: Secondary | ICD-10-CM | POA: Diagnosis not present

## 2020-02-19 DIAGNOSIS — Z20822 Contact with and (suspected) exposure to covid-19: Secondary | ICD-10-CM | POA: Diagnosis not present

## 2020-02-19 LAB — SARS CORONAVIRUS 2 (TAT 6-24 HRS): SARS Coronavirus 2: NEGATIVE

## 2020-02-23 ENCOUNTER — Ambulatory Visit
Admission: RE | Admit: 2020-02-23 | Discharge: 2020-02-23 | Disposition: A | Discharge status: Home / Self Care | Payer: Medicare HMO | Source: Ambulatory Visit | Attending: General Surgery | Admitting: General Surgery

## 2020-02-23 ENCOUNTER — Ambulatory Visit (HOSPITAL_BASED_OUTPATIENT_CLINIC_OR_DEPARTMENT_OTHER): Payer: Medicare HMO | Admitting: Anesthesiology

## 2020-02-23 ENCOUNTER — Encounter (HOSPITAL_COMMUNITY): Payer: Self-pay

## 2020-02-23 ENCOUNTER — Encounter (HOSPITAL_BASED_OUTPATIENT_CLINIC_OR_DEPARTMENT_OTHER)
Admission: RE | Disposition: A | Discharge status: Home / Self Care | Payer: Self-pay | Source: Home / Self Care | Attending: General Surgery

## 2020-02-23 ENCOUNTER — Encounter (HOSPITAL_BASED_OUTPATIENT_CLINIC_OR_DEPARTMENT_OTHER): Payer: Self-pay | Admitting: General Surgery

## 2020-02-23 ENCOUNTER — Ambulatory Visit (HOSPITAL_BASED_OUTPATIENT_CLINIC_OR_DEPARTMENT_OTHER)
Admission: RE | Admit: 2020-02-23 | Discharge: 2020-02-23 | Disposition: A | Discharge status: Home / Self Care | Payer: Medicare HMO | Attending: General Surgery | Admitting: General Surgery

## 2020-02-23 ENCOUNTER — Other Ambulatory Visit: Payer: Self-pay

## 2020-02-23 DIAGNOSIS — D241 Benign neoplasm of right breast: Secondary | ICD-10-CM

## 2020-02-23 DIAGNOSIS — N6489 Other specified disorders of breast: Secondary | ICD-10-CM | POA: Insufficient documentation

## 2020-02-23 DIAGNOSIS — R921 Mammographic calcification found on diagnostic imaging of breast: Secondary | ICD-10-CM | POA: Diagnosis not present

## 2020-02-23 DIAGNOSIS — Z7984 Long term (current) use of oral hypoglycemic drugs: Secondary | ICD-10-CM | POA: Diagnosis not present

## 2020-02-23 DIAGNOSIS — Z79899 Other long term (current) drug therapy: Secondary | ICD-10-CM | POA: Diagnosis not present

## 2020-02-23 DIAGNOSIS — I129 Hypertensive chronic kidney disease with stage 1 through stage 4 chronic kidney disease, or unspecified chronic kidney disease: Secondary | ICD-10-CM | POA: Diagnosis not present

## 2020-02-23 DIAGNOSIS — F17210 Nicotine dependence, cigarettes, uncomplicated: Secondary | ICD-10-CM | POA: Insufficient documentation

## 2020-02-23 DIAGNOSIS — I10 Essential (primary) hypertension: Secondary | ICD-10-CM | POA: Insufficient documentation

## 2020-02-23 DIAGNOSIS — R928 Other abnormal and inconclusive findings on diagnostic imaging of breast: Secondary | ICD-10-CM | POA: Diagnosis not present

## 2020-02-23 DIAGNOSIS — Z7982 Long term (current) use of aspirin: Secondary | ICD-10-CM | POA: Insufficient documentation

## 2020-02-23 DIAGNOSIS — N6091 Unspecified benign mammary dysplasia of right breast: Secondary | ICD-10-CM | POA: Diagnosis not present

## 2020-02-23 DIAGNOSIS — N184 Chronic kidney disease, stage 4 (severe): Secondary | ICD-10-CM | POA: Diagnosis not present

## 2020-02-23 DIAGNOSIS — E119 Type 2 diabetes mellitus without complications: Secondary | ICD-10-CM | POA: Diagnosis not present

## 2020-02-23 DIAGNOSIS — E1122 Type 2 diabetes mellitus with diabetic chronic kidney disease: Secondary | ICD-10-CM | POA: Diagnosis not present

## 2020-02-23 DIAGNOSIS — N6011 Diffuse cystic mastopathy of right breast: Secondary | ICD-10-CM | POA: Diagnosis not present

## 2020-02-23 HISTORY — DX: Nicotine dependence, unspecified, uncomplicated: F17.200

## 2020-02-23 HISTORY — DX: Chronic kidney disease, unspecified: N18.9

## 2020-02-23 HISTORY — PX: BREAST LUMPECTOMY WITH RADIOACTIVE SEED LOCALIZATION: SHX6424

## 2020-02-23 HISTORY — DX: Benign neoplasm of right breast: D24.1

## 2020-02-23 LAB — BASIC METABOLIC PANEL
Anion gap: 12 (ref 5–15)
BUN: 35 mg/dL — ABNORMAL HIGH (ref 8–23)
CO2: 27 mmol/L (ref 22–32)
Calcium: 9.7 mg/dL (ref 8.9–10.3)
Chloride: 98 mmol/L (ref 98–111)
Creatinine, Ser: 2.3 mg/dL — ABNORMAL HIGH (ref 0.44–1.00)
GFR calc Af Amer: 24 mL/min — ABNORMAL LOW (ref >60)
GFR calc non Af Amer: 21 mL/min — ABNORMAL LOW (ref >60)
Glucose, Bld: 385 mg/dL — ABNORMAL HIGH (ref 70–99)
Potassium: 4.5 mmol/L (ref 3.5–5.1)
Sodium: 137 mmol/L (ref 135–145)

## 2020-02-23 LAB — GLUCOSE, CAPILLARY
Glucose-Capillary: 370 mg/dL — ABNORMAL HIGH (ref 70–99)
Glucose-Capillary: 358 mg/dL — ABNORMAL HIGH (ref 70–99)
Glucose-Capillary: 277 mg/dL — ABNORMAL HIGH (ref 70–99)
Glucose-Capillary: 157 mg/dL — ABNORMAL HIGH (ref 70–99)

## 2020-02-23 SURGERY — BREAST LUMPECTOMY WITH RADIOACTIVE SEED LOCALIZATION
Anesthesia: General | Site: Breast | Laterality: Right

## 2020-02-23 MED ORDER — OXYCODONE HCL 5 MG PO TABS: 5.0000 mg | ORAL_TABLET | Freq: Four times a day (QID) | ORAL | 0 refills | Status: DC | PRN

## 2020-02-23 MED ORDER — OXYCODONE HCL 5 MG PO TABS: 5.0000 mg | ORAL_TABLET | Freq: Once | ORAL | Status: DC | PRN

## 2020-02-23 MED ORDER — ACETAMINOPHEN 500 MG PO TABS
1000.0000 mg | ORAL_TABLET | ORAL | Status: AC
  Administered 2020-02-23: 1000 mg via ORAL

## 2020-02-23 MED ORDER — CELECOXIB 200 MG PO CAPS
200.0000 mg | ORAL_CAPSULE | ORAL | Status: AC
  Administered 2020-02-23: 200 mg via ORAL

## 2020-02-23 MED ORDER — INSULIN ASPART 100 UNIT/ML ~~LOC~~ SOLN
SUBCUTANEOUS | Status: AC
  Filled 2020-02-23: qty 1

## 2020-02-23 MED ORDER — ACETAMINOPHEN 500 MG PO TABS
ORAL_TABLET | ORAL | Status: AC
  Filled 2020-02-23: qty 2

## 2020-02-23 MED ORDER — FENTANYL CITRATE (PF) 100 MCG/2ML IJ SOLN
INTRAMUSCULAR | Status: DC | PRN
  Administered 2020-02-23 (×2): 25 ug via INTRAVENOUS

## 2020-02-23 MED ORDER — GABAPENTIN 300 MG PO CAPS
ORAL_CAPSULE | ORAL | Status: AC
  Filled 2020-02-23: qty 1

## 2020-02-23 MED ORDER — ONDANSETRON HCL 4 MG/2ML IJ SOLN
INTRAMUSCULAR | Status: DC | PRN
  Administered 2020-02-23: 4 mg via INTRAVENOUS

## 2020-02-23 MED ORDER — ONDANSETRON HCL 4 MG/2ML IJ SOLN
4.0000 mg | Freq: Four times a day (QID) | INTRAMUSCULAR | Status: AC | PRN
  Administered 2020-02-23: 4 mg via INTRAVENOUS

## 2020-02-23 MED ORDER — CEFAZOLIN SODIUM-DEXTROSE 2-4 GM/100ML-% IV SOLN
INTRAVENOUS | Status: AC
  Filled 2020-02-23: qty 100

## 2020-02-23 MED ORDER — CHLORHEXIDINE GLUCONATE CLOTH 2 % EX PADS: 6.0000 | MEDICATED_PAD | Freq: Once | CUTANEOUS | Status: DC

## 2020-02-23 MED ORDER — CEFAZOLIN SODIUM-DEXTROSE 2-4 GM/100ML-% IV SOLN
2.0000 g | INTRAVENOUS | Status: AC
  Administered 2020-02-23: 2 g via INTRAVENOUS

## 2020-02-23 MED ORDER — EPHEDRINE SULFATE-NACL 50-0.9 MG/10ML-% IV SOSY
PREFILLED_SYRINGE | INTRAVENOUS | Status: DC | PRN
  Administered 2020-02-23: 7.5 mg via INTRAVENOUS
  Administered 2020-02-23 (×3): 5 mg via INTRAVENOUS
  Administered 2020-02-23: 10 mg via INTRAVENOUS
  Administered 2020-02-23: 7.5 mg via INTRAVENOUS

## 2020-02-23 MED ORDER — GABAPENTIN 300 MG PO CAPS
300.0000 mg | ORAL_CAPSULE | ORAL | Status: AC
  Administered 2020-02-23: 300 mg via ORAL

## 2020-02-23 MED ORDER — DEXAMETHASONE SODIUM PHOSPHATE 10 MG/ML IJ SOLN
INTRAMUSCULAR | Status: DC | PRN
  Administered 2020-02-23: 5 mg via INTRAVENOUS

## 2020-02-23 MED ORDER — INSULIN ASPART 100 UNIT/ML ~~LOC~~ SOLN
6.0000 [IU] | Freq: Once | SUBCUTANEOUS | Status: AC
  Administered 2020-02-23: 6 [IU] via SUBCUTANEOUS

## 2020-02-23 MED ORDER — ONDANSETRON 4 MG PO TBDP
ORAL_TABLET | ORAL | Status: AC
  Filled 2020-02-23: qty 1

## 2020-02-23 MED ORDER — FENTANYL CITRATE (PF) 100 MCG/2ML IJ SOLN
INTRAMUSCULAR | Status: AC
  Filled 2020-02-23: qty 2

## 2020-02-23 MED ORDER — FENTANYL CITRATE (PF) 100 MCG/2ML IJ SOLN: 50.0000 ug | INTRAMUSCULAR | Status: DC | PRN

## 2020-02-23 MED ORDER — LACTATED RINGERS IV SOLN: INTRAVENOUS | Status: DC

## 2020-02-23 MED ORDER — FENTANYL CITRATE (PF) 100 MCG/2ML IJ SOLN: 25.0000 ug | INTRAMUSCULAR | Status: DC | PRN

## 2020-02-23 MED ORDER — CELECOXIB 200 MG PO CAPS
ORAL_CAPSULE | ORAL | Status: AC
  Filled 2020-02-23: qty 1

## 2020-02-23 MED ORDER — LIDOCAINE HCL (CARDIAC) PF 100 MG/5ML IV SOSY
PREFILLED_SYRINGE | INTRAVENOUS | Status: DC | PRN
  Administered 2020-02-23: 60 mg via INTRAVENOUS

## 2020-02-23 MED ORDER — PROPOFOL 10 MG/ML IV BOLUS
INTRAVENOUS | Status: DC | PRN
  Administered 2020-02-23: 120 mg via INTRAVENOUS
  Administered 2020-02-23: 50 mg via INTRAVENOUS

## 2020-02-23 MED ORDER — BUPIVACAINE-EPINEPHRINE (PF) 0.25% -1:200000 IJ SOLN
INTRAMUSCULAR | Status: DC | PRN
  Administered 2020-02-23: 20 mL via PERINEURAL

## 2020-02-23 MED ORDER — OXYCODONE HCL 5 MG/5ML PO SOLN: 5.0000 mg | Freq: Once | ORAL | Status: DC | PRN

## 2020-02-23 MED ORDER — MIDAZOLAM HCL 2 MG/2ML IJ SOLN: 1.0000 mg | INTRAMUSCULAR | Status: DC | PRN

## 2020-02-23 SURGICAL SUPPLY — 40 items
APPLIER CLIP 9.375 MED OPEN (MISCELLANEOUS)
BINDER BREAST LRG (GAUZE/BANDAGES/DRESSINGS) ×2 IMPLANT
BLADE SURG 15 STRL LF DISP TIS (BLADE) ×1 IMPLANT
BLADE SURG 15 STRL SS (BLADE) ×2
CANISTER SUC SOCK COL 7IN (MISCELLANEOUS) ×2 IMPLANT
CANISTER SUCT 1200ML W/VALVE (MISCELLANEOUS) ×2 IMPLANT
CHLORAPREP W/TINT 26 (MISCELLANEOUS) ×2 IMPLANT
CLIP APPLIE 9.375 MED OPEN (MISCELLANEOUS) IMPLANT
COVER BACK TABLE 60X90IN (DRAPES) ×2 IMPLANT
COVER MAYO STAND STRL (DRAPES) ×2 IMPLANT
COVER PROBE W GEL 5X96 (DRAPES) ×2 IMPLANT
COVER WAND RF STERILE (DRAPES) IMPLANT
DECANTER SPIKE VIAL GLASS SM (MISCELLANEOUS) IMPLANT
DERMABOND ADVANCED (GAUZE/BANDAGES/DRESSINGS) ×1
DERMABOND ADVANCED .7 DNX12 (GAUZE/BANDAGES/DRESSINGS) ×1 IMPLANT
DRAPE LAPAROSCOPIC ABDOMINAL (DRAPES) ×2 IMPLANT
DRAPE UTILITY XL STRL (DRAPES) ×2 IMPLANT
ELECT COATED BLADE 2.86 ST (ELECTRODE) ×2 IMPLANT
ELECT REM PT RETURN 9FT ADLT (ELECTROSURGICAL) ×2
ELECTRODE REM PT RTRN 9FT ADLT (ELECTROSURGICAL) ×1 IMPLANT
GLOVE BIO SURGEON STRL SZ7.5 (GLOVE) ×4 IMPLANT
GOWN STRL REUS W/ TWL LRG LVL3 (GOWN DISPOSABLE) ×2 IMPLANT
GOWN STRL REUS W/TWL LRG LVL3 (GOWN DISPOSABLE) ×4
ILLUMINATOR WAVEGUIDE N/F (MISCELLANEOUS) IMPLANT
KIT MARKER MARGIN INK (KITS) ×2 IMPLANT
LIGHT WAVEGUIDE WIDE FLAT (MISCELLANEOUS) IMPLANT
NEEDLE HYPO 25X1 1.5 SAFETY (NEEDLE) IMPLANT
NS IRRIG 1000ML POUR BTL (IV SOLUTION) IMPLANT
PACK BASIN DAY SURGERY FS (CUSTOM PROCEDURE TRAY) ×2 IMPLANT
PENCIL SMOKE EVACUATOR (MISCELLANEOUS) ×2 IMPLANT
SLEEVE SCD COMPRESS KNEE MED (MISCELLANEOUS) ×2 IMPLANT
SPONGE LAP 18X18 RF (DISPOSABLE) ×2 IMPLANT
SUT MON AB 4-0 PC3 18 (SUTURE) ×2 IMPLANT
SUT SILK 2 0 SH (SUTURE) IMPLANT
SUT VICRYL 3-0 CR8 SH (SUTURE) ×2 IMPLANT
SYR CONTROL 10ML LL (SYRINGE) IMPLANT
TOWEL GREEN STERILE FF (TOWEL DISPOSABLE) ×2 IMPLANT
TRAY FAXITRON CT DISP (TRAY / TRAY PROCEDURE) ×6 IMPLANT
TUBE CONNECTING 20X1/4 (TUBING) ×2 IMPLANT
YANKAUER SUCT BULB TIP NO VENT (SUCTIONS) IMPLANT

## 2020-02-23 NOTE — Interval H&P Note (Signed)
History and Physical Interval Note:  02/23/2020 2:27 PM  Andrea Brown  has presented today for surgery, with the diagnosis of RIGHT BREAST PAPILLOMA.  The various methods of treatment have been discussed with the patient and family. After consideration of risks, benefits and other options for treatment, the patient has consented to  Procedure(s): RIGHT BREAST LUMPECTOMY X 3 WITH RADIOACTIVE SEED LOCALIZATION (Right) as a surgical intervention.  The patient's history has been reviewed, patient examined, no change in status, stable for surgery.  I have reviewed the patient's chart and labs.  Questions were answered to the patient's satisfaction.     Autumn Messing III

## 2020-02-23 NOTE — H&P (Signed)
Andrea Brown  Location: [facility] Patient #: [id number] DOB: 1951-11-26 Single / Language: Cleophus Molt / Race: Black or African American Female   History of Present Illness  The patient is a 69 year old female who presents with a breast mass. We are asked to see the patient in consultation by Dr. Margarita Rana to evaluate her for intraductal papillomas of the right breast. The patient is a 69 year old black female who recently went for a routine screening mammogram. At that time she was found to have 3 abnormal areas of calcification centrally in the right breast. The smallest behind the nipple measuring 1-2 mm. The next largest one measured 3-4 mm and the deepest one measured 1.1 cm. All 3 of these areas were biopsied and all came back as intraductal papillomas with calcification. All of these were benign but the recommendation is to have each of these removed likely because of the suspicious nature of the calcifications. She otherwise has hypertension and diabetes and smokes about a third a pack of cigarettes a day. She has no family history of breast cancer   Past Surgical History  Hysterectomy (not due to cancer) - Complete   Diagnostic Studies History  Colonoscopy  5-10 years ago Mammogram  within last year Pap Smear  1-5 years ago  Allergies  No Known Drug Allergies   Medication History  amLODIPine Besylate (10MG  Tablet, Oral) Active. glipiZIDE (5MG  Tablet, Oral) Active. hydroCHLOROthiazide (25MG  Tablet, Oral) Active. Simvastatin (10MG  Tablet, Oral) Active. Isosorbide Mononitrate ER (60MG  Tablet ER 24HR, Oral) Active. Aspirin (81MG  Tablet, Oral) Active. Medications Reconciled  Social History Caffeine use  Coffee, Tea. No drug use  Tobacco use  Current some day smoker.  Family History  First Degree Relatives  No pertinent family history   Pregnancy / Birth History Age at menarche  27 years. Gravida  2 Maternal age  6-25 Para  2  Other  Problems  Diabetes Mellitus     Review of Systems General Not Present- Appetite Loss, Chills, Fatigue, Fever, Night Sweats, Weight Gain and Weight Loss. Skin Not Present- Change in Wart/Mole, Dryness, Hives, Jaundice, New Lesions, Non-Healing Wounds, Rash and Ulcer. HEENT Not Present- Earache, Hearing Loss, Hoarseness, Nose Bleed, Oral Ulcers, Ringing in the Ears, Seasonal Allergies, Sinus Pain, Sore Throat, Visual Disturbances, Wears glasses/contact lenses and Yellow Eyes. Respiratory Not Present- Bloody sputum, Chronic Cough, Difficulty Breathing, Snoring and Wheezing. Cardiovascular Not Present- Chest Pain, Difficulty Breathing Lying Down, Leg Cramps, Palpitations, Rapid Heart Rate, Shortness of Breath and Swelling of Extremities. Gastrointestinal Not Present- Abdominal Pain, Bloating, Bloody Stool, Change in Bowel Habits, Chronic diarrhea, Constipation, Difficulty Swallowing, Excessive gas, Gets full quickly at meals, Hemorrhoids, Indigestion, Nausea, Rectal Pain and Vomiting. Female Genitourinary Not Present- Frequency, Nocturia, Painful Urination, Pelvic Pain and Urgency. Musculoskeletal Not Present- Back Pain, Joint Pain, Joint Stiffness, Muscle Pain, Muscle Weakness and Swelling of Extremities. Neurological Not Present- Decreased Memory, Fainting, Headaches, Numbness, Seizures, Tingling, Tremor, Trouble walking and Weakness. Psychiatric Not Present- Anxiety, Bipolar, Change in Sleep Pattern, Depression, Fearful and Frequent crying. Endocrine Not Present- Cold Intolerance, Excessive Hunger, Hair Changes, Heat Intolerance, Hot flashes and New Diabetes. Hematology Not Present- Blood Thinners, Easy Bruising, Excessive bleeding, Gland problems, HIV and Persistent Infections.  Vitals  Weight: 136 lb Height: 61in Body Surface Area: 1.6 m Body Mass Index: 25.7 kg/m  Pulse: 78 (Regular)  BP: 136/70 (Sitting, Left Arm, Standard)       Physical Exam  General Mental  Status-Alert. General Appearance-Consistent with stated age. Hydration-Well  hydrated. Voice-Normal.  Head and Neck Head-normocephalic, atraumatic with no lesions or palpable masses. Trachea-midline. Thyroid Gland Characteristics - normal size and consistency.  Eye Eyeball - Bilateral-Extraocular movements intact. Sclera/Conjunctiva - Bilateral-No scleral icterus.  Chest and Lung Exam Chest and lung exam reveals -quiet, even and easy respiratory effort with no use of accessory muscles and on auscultation, normal breath sounds, no adventitious sounds and normal vocal resonance. Inspection Chest Wall - Normal. Back - normal.  Breast Note: There is no palpable mass in either breast. There is no palpable axillary, supraclavicular, or cervical lymphadenopathy   Cardiovascular Cardiovascular examination reveals -normal heart sounds, regular rate and rhythm with no murmurs and normal pedal pulses bilaterally.  Abdomen Inspection Inspection of the abdomen reveals - No Hernias. Skin - Scar - no surgical scars. Palpation/Percussion Palpation and Percussion of the abdomen reveal - Soft, Non Tender, No Rebound tenderness, No Rigidity (guarding) and No hepatosplenomegaly. Auscultation Auscultation of the abdomen reveals - Bowel sounds normal.  Neurologic Neurologic evaluation reveals -alert and oriented x 3 with no impairment of recent or remote memory. Mental Status-Normal.  Musculoskeletal Normal Exam - Left-Upper Extremity Strength Normal and Lower Extremity Strength Normal. Normal Exam - Right-Upper Extremity Strength Normal and Lower Extremity Strength Normal.  Lymphatic Head & Neck  General Head & Neck Lymphatics: Bilateral - Description - Normal. Axillary  General Axillary Region: Bilateral - Description - Normal. Tenderness - Non Tender. Femoral & Inguinal  Generalized Femoral & Inguinal Lymphatics: Bilateral - Description - Normal.  Tenderness - Non Tender.    Assessment & Plan INTRADUCTAL PAPILLOMA OF BREAST, RIGHT (D24.1) Impression: The patient appears to have 3 areas of intraductal papilloma in the right breast. Because of the abnormal appearance of the calcifications and because there is about a 5% chance of missing something more significant the recommendation is to have these areas removed. She would also like to have this done. I have discussed with her in detail the risks and benefits of the operation as well as some of the technical aspects including the use of 3 radioactive seeds for localization and she understands and wishes to proceed. This patient encounter took 30 minutes today to perform the following: take history, perform exam, review outside records, interpret imaging, counsel the patient on their diagnosis and document encounter, findings & plan in the EHR Current Plans Pt Education - Breast Diseases: discussed with patient and provided information.

## 2020-02-23 NOTE — Anesthesia Preprocedure Evaluation (Signed)
Anesthesia Evaluation  Patient identified by MRN, date of birth, ID band Patient awake    Reviewed: Allergy & Precautions, H&P , NPO status , Patient's Chart, lab work & pertinent test results  Airway Mallampati: II   Neck ROM: full    Dental   Pulmonary Current Smoker,    breath sounds clear to auscultation       Cardiovascular hypertension,  Rhythm:regular Rate:Normal     Neuro/Psych    GI/Hepatic   Endo/Other  diabetes, Type 2  Renal/GU Renal InsufficiencyRenal disease     Musculoskeletal   Abdominal   Peds  Hematology   Anesthesia Other Findings   Reproductive/Obstetrics Breast CA                            Anesthesia Physical Anesthesia Plan  ASA: III  Anesthesia Plan: General   Post-op Pain Management:    Induction: Intravenous  PONV Risk Score and Plan: 2 and Ondansetron and Treatment may vary due to age or medical condition  Airway Management Planned: LMA  Additional Equipment:   Intra-op Plan:   Post-operative Plan:   Informed Consent: I have reviewed the patients History and Physical, chart, labs and discussed the procedure including the risks, benefits and alternatives for the proposed anesthesia with the patient or authorized representative who has indicated his/her understanding and acceptance.       Plan Discussed with: CRNA, Anesthesiologist and Surgeon  Anesthesia Plan Comments:         Anesthesia Quick Evaluation

## 2020-02-23 NOTE — Discharge Instructions (Signed)
  Post Anesthesia Home Care Instructions  Activity: Get plenty of rest for the remainder of the day. A responsible individual must stay with you for 24 hours following the procedure.  For the next 24 hours, DO NOT: -Drive a car -Paediatric nurse -Drink alcoholic beverages -Take any medication unless instructed by your physician -Make any legal decisions or sign important papers.  Meals: Start with liquid foods such as gelatin or soup. Progress to regular foods as tolerated. Avoid greasy, spicy, heavy foods. If nausea and/or vomiting occur, drink only clear liquids until the nausea and/or vomiting subsides. Call your physician if vomiting continues.  Special Instructions/Symptoms: Your throat may feel dry or sore from the anesthesia or the breathing tube placed in your throat during surgery. If this causes discomfort, gargle with warm salt water. The discomfort should disappear within 24 hours.  If you had a scopolamine patch placed behind your ear for the management of post- operative nausea and/or vomiting:  1. The medication in the patch is effective for 72 hours, after which it should be removed.  Wrap patch in a tissue and discard in the trash. Wash hands thoroughly with soap and water. 2. You may remove the patch earlier than 72 hours if you experience unpleasant side effects which may include dry mouth, dizziness or visual disturbances. 3. Avoid touching the patch. Wash your hands with soap and water after contact with the patch.       Next dose of tylenol may be given at 6p tonight if needed.

## 2020-02-23 NOTE — Op Note (Signed)
02/23/2020  4:15 PM  PATIENT:  Andrea Brown  69 y.o. female  PRE-OPERATIVE DIAGNOSIS:  RIGHT BREAST PAPILLOMA X 3  POST-OPERATIVE DIAGNOSIS:  RIGHT BREAST PAPILLOMA X 3  PROCEDURE:  Procedure(s): RIGHT BREAST LUMPECTOMY X 3 WITH RADIOACTIVE SEED LOCALIZATION (Right)  SURGEON:  Surgeon(s) and Role:    Jovita Kussmaul, MD - Primary  PHYSICIAN ASSISTANT:   ASSISTANTS: none   ANESTHESIA:   local and general  EBL:  5 mL   BLOOD ADMINISTERED:none  DRAINS: none   LOCAL MEDICATIONS USED:  MARCAINE     SPECIMEN:  Source of Specimen:  right breast tissue x 3  DISPOSITION OF SPECIMEN:  PATHOLOGY  COUNTS:  YES  TOURNIQUET:  * No tourniquets in log *  DICTATION: .Dragon Dictation   After informed consent was obtained the patient was brought to the operating room and placed in the supine position on the operating table.  After adequate induction of general anesthesia the patient's right breast was prepped with ChloraPrep, allowed to dry, and draped in usual sterile manner.  An appropriate timeout was performed.  Previously 3 I-125 seeds were placed in the right breast to mark areas of papilloma with calcification.  The neoprobe was set to I-125 and the areas of radioactivity were readily identified.  The area around this was infiltrated with quarter percent Marcaine.  A curvilinear incision was made along the outer aspect of the right areola with a 15 blade knife.  The incision was carried through the skin and subcutaneous tissue sharply with the electrocautery.  Dissection was first carried out between the breast tissue and the subcutaneous fat laterally.  Once we were well beyond the lateralmost seed I then removed a circular portion of breast tissue sharply around the radioactive seed while checking the area of radioactivity frequently.  Once this tissue was removed it was oriented with the appropriate paint colors.  A specimen radiograph was obtained that showed the clip and seed to be  within the specimen.  This was sent as the middle depth lesion.  The most superficial lesion was actually right behind the nipple.  Dissection at this point was then carried from the incision behind the nipple.  Once the dissection was beyond the location of the radioactive seed I then removed a small circular portion of breast tissue sharply with the electrocautery around the radioactive seed while checking the area of radioactivity frequently.  Once the specimen was removed it was oriented with the appropriate paint colors.  A specimen radiograph showed the clip and seed to be within the specimen.  This was sent as the most superficial of the 3 specimens.  Attention was then turned to the deepest specimen.  The location of the third seed appeared to be at the deepest part of the lumpectomy cavity.  A circular portion of breast tissue was then excised sharply around the radioactive seed while checking the area of radioactivity frequently.  Once the specimen was removed it was also oriented with the appropriate paint colors.  A specimen radiograph was obtained that showed the clip and seed to be within the specimen.  This was sent as the deepest of the 3 specimens.  Hemostasis was achieved using the Bovie electrocautery.  The wound was irrigated with saline and infiltrated with quarter percent Marcaine.  The deep layer of the wound was then closed with layers of interrupted 3-0 Vicryl stitches.  The skin was closed with interrupted 4-0 Monocryl subcuticular stitches.  Dermabond dressings were  applied.  The patient tolerated the procedure well.  At the end of the case all needle sponge and instrument counts were correct.  The patient was then awakened and taken to recovery in stable condition.  PLAN OF CARE: Discharge to home after PACU  PATIENT DISPOSITION:  PACU - hemodynamically stable.   Delay start of Pharmacological VTE agent (>24hrs) due to surgical blood loss or risk of bleeding: not applicable

## 2020-02-23 NOTE — Anesthesia Procedure Notes (Signed)
Procedure Name: LMA Insertion Date/Time: 02/23/2020 3:18 PM Performed by: Raenette Rover, CRNA Pre-anesthesia Checklist: Patient identified, Emergency Drugs available, Suction available and Patient being monitored Patient Re-evaluated:Patient Re-evaluated prior to induction Oxygen Delivery Method: Circle system utilized Preoxygenation: Pre-oxygenation with 100% oxygen Induction Type: IV induction LMA: LMA inserted LMA Size: 4.0 Number of attempts: 1 Placement Confirmation: positive ETCO2 and breath sounds checked- equal and bilateral Tube secured with: Tape Dental Injury: Teeth and Oropharynx as per pre-operative assessment

## 2020-02-23 NOTE — Transfer of Care (Signed)
Immediate Anesthesia Transfer of Care Note  Patient: Andrea Brown  Procedure(s) Performed: RIGHT BREAST LUMPECTOMY X 3 WITH RADIOACTIVE SEED LOCALIZATION (Right Breast)  Patient Location: PACU  Anesthesia Type:General  Level of Consciousness: drowsy and patient cooperative  Airway & Oxygen Therapy: Patient Spontanous Breathing and Patient connected to face mask oxygen  Post-op Assessment: Report given to RN and Post -op Vital signs reviewed and stable  Post vital signs: Reviewed and stable  Last Vitals:  Vitals Value Taken Time  BP 116/56 02/23/20 1622  Temp    Pulse 73 02/23/20 1625  Resp 18 02/23/20 1625  SpO2 100 % 02/23/20 1625  Vitals shown include unvalidated device data.  Last Pain:  Vitals:   02/23/20 1112  TempSrc: Oral  PainSc: 0-No pain         Complications: No apparent anesthesia complications

## 2020-02-24 ENCOUNTER — Encounter: Payer: Self-pay | Admitting: *Deleted

## 2020-02-25 LAB — SURGICAL PATHOLOGY

## 2020-02-25 NOTE — Anesthesia Postprocedure Evaluation (Signed)
Anesthesia Post Note  Patient: Andrea Brown  Procedure(s) Performed: RIGHT BREAST LUMPECTOMY X 3 WITH RADIOACTIVE SEED LOCALIZATION (Right Breast)     Patient location during evaluation: PACU Anesthesia Type: General Level of consciousness: awake and alert Pain management: pain level controlled Vital Signs Assessment: post-procedure vital signs reviewed and stable Respiratory status: spontaneous breathing, nonlabored ventilation, respiratory function stable and patient connected to nasal cannula oxygen Cardiovascular status: blood pressure returned to baseline and stable Postop Assessment: no apparent nausea or vomiting Anesthetic complications: no    Last Vitals:  Vitals:   02/23/20 1645 02/23/20 1725  BP: 128/63 119/61  Pulse: 71 81  Resp: 13 20  Temp:  36.4 C  SpO2: 94% 99%    Last Pain:  Vitals:   02/24/20 1028  TempSrc:   PainSc: 0-No pain                 Cyara Devoto S

## 2020-03-01 ENCOUNTER — Telehealth: Payer: Self-pay

## 2020-03-01 NOTE — Telephone Encounter (Signed)
-----   Message from Charlott Rakes, MD sent at 02/26/2020  9:59 PM EDT ----- Pathology of breast is negative for malignancy

## 2020-03-01 NOTE — Telephone Encounter (Signed)
Patient name and DOB has been verified Patient was informed of lab results. Patient had no questions.  

## 2020-03-23 ENCOUNTER — Encounter: Payer: Self-pay | Admitting: Family Medicine

## 2020-03-23 ENCOUNTER — Ambulatory Visit: Payer: Medicare HMO | Attending: Family Medicine | Admitting: Family Medicine

## 2020-03-23 ENCOUNTER — Other Ambulatory Visit: Payer: Self-pay

## 2020-03-23 VITALS — BP 148/63 | HR 69 | Ht 61.0 in | Wt 133.0 lb

## 2020-03-23 DIAGNOSIS — E1121 Type 2 diabetes mellitus with diabetic nephropathy: Secondary | ICD-10-CM | POA: Diagnosis not present

## 2020-03-23 DIAGNOSIS — I10 Essential (primary) hypertension: Secondary | ICD-10-CM | POA: Diagnosis not present

## 2020-03-23 DIAGNOSIS — E785 Hyperlipidemia, unspecified: Secondary | ICD-10-CM

## 2020-03-23 DIAGNOSIS — Z720 Tobacco use: Secondary | ICD-10-CM | POA: Diagnosis not present

## 2020-03-23 DIAGNOSIS — E1169 Type 2 diabetes mellitus with other specified complication: Secondary | ICD-10-CM

## 2020-03-23 LAB — GLUCOSE, POCT (MANUAL RESULT ENTRY): POC Glucose: 325 mg/dl — AB (ref 70–99)

## 2020-03-23 MED ORDER — HYDROCHLOROTHIAZIDE 25 MG PO TABS: 25.0000 mg | ORAL_TABLET | Freq: Every day | ORAL | 1 refills | Status: DC

## 2020-03-23 MED ORDER — AMLODIPINE BESYLATE 10 MG PO TABS: 10.0000 mg | ORAL_TABLET | Freq: Every day | ORAL | 1 refills | Status: DC

## 2020-03-23 MED ORDER — ACCU-CHEK GUIDE VI STRP: ORAL_STRIP | 12 refills | Status: DC

## 2020-03-23 MED ORDER — ACCU-CHEK GUIDE ME W/DEVICE KIT: 1.0000 | PACK | Freq: Three times a day (TID) | 0 refills | Status: DC

## 2020-03-23 MED ORDER — GLIPIZIDE 10 MG PO TABS: 10.0000 mg | ORAL_TABLET | Freq: Two times a day (BID) | ORAL | 1 refills | Status: DC

## 2020-03-23 MED ORDER — ISOSORBIDE MONONITRATE ER 60 MG PO TB24: 60.0000 mg | ORAL_TABLET | Freq: Every day | ORAL | 1 refills | Status: DC

## 2020-03-23 NOTE — Progress Notes (Signed)
Established Patient Office Visit  Subjective:  Patient ID: Andrea Brown, female    DOB: 04-05-1951  Age: 69 y.o. MRN: [id number]  CC:  Chief Complaint  Patient presents with  . Diabetes    HPI Andrea Brown is 69 yo female with a medical history of Diabetes Mellitus Type 2, Hypertension, Hyperlipidemia, CKD stage 3/4 and tobacco use  that comes into the office today for 6 month  follow up. She does not have any pressing concerns for today's visit. She reports that she is compliant with all of her medications. She has noticed that her fasting CBGs at her office visits have been running high in the 300s. Today's CBG in office was 325. She reports that she has not been checking her blood sugars at home and that she needs a need Blood Glucose monitor. She would like to get back ranging in the 90-100s for her fasting sugars. She does not report any headaches, dizziness, vision changes, numbness/tingling or any hypoglycemic episodes   She recently had a R. Breast Lumpectomy x3 with Radioactive Seed Localization last month (02/23/2020) for Papilloma of R. Breast.   Surgical Pathology Results: FINAL MICROSCOPIC DIAGNOSIS:   A. BREAST, RIGHT SUPERFICIAL, LUMPECTOMY:  - Fibrocystic change with sclerosing hyaline periductal fibrosis  - Patchy usual ductal hyperplasia and rare calcifications  - Biopsy site changes  - Negative for carcinoma   B. BREAST, RIGHT MIDDLE, LUMPECTOMY:  - Fibrocystic change with sclerosing hyaline periductal fibrosis,  including remnants of a hyalinized intraductal papilloma  - Patchy usual ductal hyperplasia and rare calcifications  - Biopsy site changes  - Negative for carcinoma   C. BREAST, RIGHT DEEP, LUMPECTOMY:  - Complex sclerosing lesion with usual ductal hyperplasia, hyalinized  intraductal papilloma and dystrophic calcifications  - Biopsy site changes  - Negative for carcinoma    The patient was notified of her results.   She is a current smoker. She  reports smoking 2-3 cigarettes a day and is currently not interested in smoking cessation.   She is currently scheduling her COVID-19 vaccine.  She does not have any other concerns for today's visit.        Past Medical History:  Diagnosis Date  . 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 Diabetes mellitus without complication ([place])   . Hyperlipidemia   . Hypertension   . Papilloma of right breast   . Smoker     Past Surgical History:  Procedure Laterality Date  . BREAST LUMPECTOMY WITH RADIOACTIVE SEED LOCALIZATION Right 02/23/2020   Procedure: RIGHT BREAST LUMPECTOMY X 3 WITH RADIOACTIVE SEED LOCALIZATION;  Surgeon: Jovita Kussmaul, MD;  Location: [facility];  Service: General;  Laterality: Right;  . TOTAL ABDOMINAL HYSTERECTOMY  2000    Family History  Problem Relation Age of Onset  . Diabetes Mother   . Hypertension Mother   . Colon cancer Neg Hx   . Esophageal cancer Neg Hx   . Pancreatic cancer Neg Hx   . Rectal cancer Neg Hx   . Stomach cancer Neg Hx     Social History   Socioeconomic History  . Marital status: Single    Spouse name: Not on file  . Number of children: Not on file  . Years of education: Not on file  . Highest education level: Not on file  Occupational History  . Not on file  Tobacco Use  . Smoking status: Current Every Day Smoker    Packs/day: 0.25  Types: Cigarettes  . Smokeless tobacco: Never Used  . Tobacco comment: patient is trying EOD  Substance and Sexual Activity  . Alcohol use: No  . Drug use: No  . Sexual activity: Not Currently    Birth control/protection: Surgical  Other Topics Concern  . Not on file  Social History Narrative  . Not on file   Social Determinants of Health   Financial Resource Strain:   . Difficulty of Paying Living Expenses:   Food Insecurity:   . Worried About Charity fundraiser in the Last Year:   . Arboriculturist in the Last Year:   Transportation Needs:   . Lexicographer (Medical):   Marland Kitchen Lack of Transportation (Non-Medical):   Physical Activity:   . Days of Exercise per Week:   . Minutes of Exercise per Session:   Stress:   . Feeling of Stress :   Social Connections:   . Frequency of Communication with Friends and Family:   . Frequency of Social Gatherings with Friends and Family:   . Attends Religious Services:   . Active Member of Clubs or Organizations:   . Attends Archivist Meetings:   Marland Kitchen Marital Status:   Intimate Partner Violence:   . Fear of Current or Ex-Partner:   . Emotionally Abused:   Marland Kitchen Physically Abused:   . Sexually Abused:     Outpatient Medications Prior to Visit  Medication Sig Dispense Refill  . aspirin 81 MG tablet Take 1 tablet (81 mg total) daily by mouth. 30 tablet 5  . Misc. Devices MISC Blood pressure monitor; diagnosis-hypertension 1 each 0  . simvastatin (ZOCOR) 10 MG tablet Take 1 tablet (10 mg total) by mouth daily at 6 PM. 90 tablet 3  . amLODipine (NORVASC) 10 MG tablet Take 1 tablet (10 mg total) by mouth daily. 90 tablet 1  . glipiZIDE (GLUCOTROL) 5 MG tablet Take 1 tablet (5 mg total) by mouth 2 (two) times daily before a meal. 180 tablet 1  . hydrochlorothiazide (HYDRODIURIL) 25 MG tablet Take 1 tablet (25 mg total) by mouth daily. 90 tablet 1  . isosorbide mononitrate (IMDUR) 60 MG 24 hr tablet Take 1 tablet (60 mg total) by mouth daily. 90 tablet 1  . oxyCODONE (OXY IR/ROXICODONE) 5 MG immediate release tablet Take 1-2 tablets (5-10 mg total) by mouth every 6 (six) hours as needed for moderate pain, severe pain or breakthrough pain. (Patient not taking: Reported on 03/23/2020) 10 tablet 0   No facility-administered medications prior to visit.    No Known Allergies  ROS Review of Systems  Constitutional: Negative for activity change, appetite change, fatigue and fever.  HENT: Negative for ear pain, hearing loss and sore throat.   Eyes: Negative.   Respiratory: Negative for cough,  shortness of breath and wheezing.   Cardiovascular: Negative for chest pain and leg swelling.  Gastrointestinal: Negative for abdominal pain, constipation, diarrhea, nausea and vomiting.  Genitourinary: Negative.   Musculoskeletal: Negative for arthralgias, joint swelling and myalgias.  Skin: Negative for rash and wound.  Neurological: Negative for dizziness, weakness, numbness and headaches.  Psychiatric/Behavioral: Negative for dysphoric mood and suicidal ideas.      Objective:    Physical Exam  Constitutional: She is oriented to person, place, and time. She appears well-developed and well-nourished.  HENT:  Head: Normocephalic and atraumatic.  Right Ear: External ear normal.  Left Ear: External ear normal.  Nose: Nose normal.  Mouth/Throat: Oropharynx is clear  and moist.  Eyes: Pupils are equal, round, and reactive to light. Conjunctivae are normal.  Neck: No JVD present.  Cardiovascular: Normal rate, regular rhythm and intact distal pulses. Exam reveals no gallop and no friction rub.  No murmur heard. Pulmonary/Chest: Effort normal and breath sounds normal. She has no wheezes.  Abdominal: Soft. Bowel sounds are normal. She exhibits no mass. There is no abdominal tenderness.  Musculoskeletal:        General: No edema. Normal range of motion.     Cervical back: Normal range of motion and neck supple.  Lymphadenopathy:    She has no cervical adenopathy.  Neurological: She is alert and oriented to person, place, and time.  Skin: Skin is warm and dry.  Psychiatric: She has a normal mood and affect. Her behavior is normal.    BP (!) 148/63   Pulse 69   Ht 5' 1"  (1.549 m)   Wt 133 lb (60.3 kg)   SpO2 98%   BMI 25.13 kg/m  Wt Readings from Last 3 Encounters:  03/23/20 133 lb (60.3 kg)  02/23/20 132 lb 7.9 oz (60.1 kg)  01/23/19 145 lb 12.8 oz (66.1 kg)     Health Maintenance Due  Topic Date Due  . OPHTHALMOLOGY EXAM  Never done  . COVID-19 Vaccine (1) Never done  .  TETANUS/TDAP  Never done  . DEXA SCAN  Never done  . FOOT EXAM  04/23/2019  . PNA vac Low Risk Adult (2 of 2 - PPSV23) 04/23/2019  . HEMOGLOBIN A1C  07/24/2019    There are no preventive care reminders to display for this patient.  Lab Results  Component Value Date   TSH 1.300 10/17/2017   Lab Results  Component Value Date   WBC 4.3 10/17/2017   HGB 9.4 (L) 10/17/2017   HCT 28.8 (L) 10/17/2017   MCV 87 10/17/2017   PLT 191 10/17/2017   Lab Results  Component Value Date   NA 137 02/23/2020   K 4.5 02/23/2020   CO2 27 02/23/2020   GLUCOSE 385 (H) 02/23/2020   BUN 35 (H) 02/23/2020   CREATININE 2.30 (H) 02/23/2020   BILITOT 0.3 09/11/2019   ALKPHOS 66 09/11/2019   AST 15 09/11/2019   ALT 13 09/11/2019   PROT 6.9 09/11/2019   ALBUMIN 4.3 09/11/2019   CALCIUM 9.7 02/23/2020   ANIONGAP 12 02/23/2020   Lab Results  Component Value Date   CHOL 145 09/11/2019   Lab Results  Component Value Date   HDL 58 09/11/2019   Lab Results  Component Value Date   LDLCALC 72 09/11/2019   Lab Results  Component Value Date   TRIG 78 09/11/2019   Lab Results  Component Value Date   CHOLHDL 2.5 09/11/2019   Lab Results  Component Value Date   HGBA1C 6.3 01/23/2019      Assessment & Plan:   Problem List Items Addressed This Visit      Endocrine   Diabetes ([place]) - Primary  Uncontrolled.  A1c pending. Last A1c 6.3 (09/10/2019). In office CBG was 325 mg/dl and patient reports she was fasting. Prescribe Blood Glucose Meter and discussed checking fasting sugars at home and patient agree to log so we could observe a trend. Counseled on medication adherence and diabetic diet Counseled on [employer] and will make a referral. Foot Exam performed at today's visit with DP 2+, no lesion wounds or diminished pinprick sensation. Advised on need for Pneumonia Vaccine, shared decision making to  wait until next visit as the patient was planning on getting COVID-19 vaccine  within the next few days.  Increased dose of Glipizide from 42m BID to 169mBID. Patient educated on the possible of episodes of dizziness and encouraged to notify clinic if occurs. - check A1C (pending) -CMP - Microalbumin/Creatinine urine -Lipid Panel -Referral to Opthalmology for Annual Eye Exam    Relevant Medications   glipiZIDE (GLUCOTROL) 10 MG tablet   Other Relevant Orders   POCT glucose (manual entry) (Completed)   Hemoglobin A1c   Ambulatory referral to Ophthalmology   CMP14+EGFR   Microalbumin / creatinine urine ratio   Lipid panel    Other Visit Diagnoses    Essential hypertension     Uncontrolled BP on today's visit was 148/63.  Counseled on blood pressure goal of less than 130/80, and maintaining a low sodium diet with 150 minutes of exercise a week.  Patient is compliant with medications    Continue current medications. - Check CMP -Check Lipid Panel  - Check Microalbumin/Creatinine Urine    Relevant Medications   amLODipine (NORVASC) 10 MG tablet   hydrochlorothiazide (HYDRODIURIL) 25 MG tablet   isosorbide mononitrate (IMDUR) 60 MG 24 hr tablet   Hyperlipidemia - Patient will come in for fasting Lipid Panel next week. She should continue on current statin therapy.  simvastatin (ZOCOR) 10 MG tablet    Tobacco abuse     Patient was counseled on smoking cessation via Motivational Interviewing. She is currently pre-contemplative and has no future plans to quit smoking, but advised on the importance of smoking cessation for hypertension and other health goals.      Meds ordered this encounter  Medications  . amLODipine (NORVASC) 10 MG tablet    Sig: Take 1 tablet (10 mg total) by mouth daily.    Dispense:  90 tablet    Refill:  1  . glipiZIDE (GLUCOTROL) 10 MG tablet    Sig: Take 1 tablet (10 mg total) by mouth 2 (two) times daily before a meal.    Dispense:  180 tablet    Refill:  1    Dose increase  . hydrochlorothiazide (HYDRODIURIL) 25 MG tablet     Sig: Take 1 tablet (25 mg total) by mouth daily.    Dispense:  90 tablet    Refill:  1  . isosorbide mononitrate (IMDUR) 60 MG 24 hr tablet    Sig: Take 1 tablet (60 mg total) by mouth daily.    Dispense:  90 tablet    Refill:  1  . Blood Glucose Monitoring Suppl (ACCU-CHEK GUIDE ME) w/Device KIT    Sig: 1 each by Does not apply route 3 (three) times daily before meals.    Dispense:  1 kit    Refill:  0  . glucose blood (ACCU-CHEK GUIDE) test strip    Sig: Use as instructed    Dispense:  100 each    Refill:  12   Healthcare Maintainance:  Discussed annual mammograms. Future discussion of Tetanus/Tdap and DEXA scan at next visit. Patient plans to receive Pneumonia Vaccine at next visit as well.     Follow-up: Return in about 3 months (around 06/22/2020) for chronic disease management.    ArTheodis SatoMedical Student   Evaluation and management procedures were performed by me with Medical Student in attendance, note written by Medical Student under my supervision and collaboration. I have reviewed the note and I agree with the management and plan.   Ms  Rice does have Diabetes Mellitus which was previously controlled with an A1c of 6.3 however most recent blood sugars reveal poor control; dose of glipizide has been increased while awaiting A1c result which was drawn today.  Unable to use metformin due to her CKD which is followed by nephrology. We have reviewed the pathology from her breast biopsy and encouraged her to continue with yearly mammograms and other components of healthcare maintenance.  We have discussed smoking cessation and its role in increasing cardiovascular risk and risk of future malignancies however she is not ready to quit at this time.  Charlott Rakes, MD, FAAFP. [facility] [place], [place]   03/24/2020, 9:08 AM

## 2020-03-23 NOTE — Patient Instructions (Signed)

## 2020-03-24 LAB — CMP14+EGFR
ALT: 14 IU/L (ref 0–32)
AST: 18 IU/L (ref 0–40)
Albumin/Globulin Ratio: 1.8 (ref 1.2–2.2)
Albumin: 4.7 g/dL (ref 3.7–4.7)
Alkaline Phosphatase: 82 IU/L (ref 39–117)
BUN/Creatinine Ratio: 17 (ref 12–28)
BUN: 39 mg/dL — ABNORMAL HIGH (ref 8–27)
Bilirubin Total: 0.3 mg/dL (ref 0.0–1.2)
CO2: 22 mmol/L (ref 20–29)
Calcium: 10.2 mg/dL (ref 8.7–10.3)
Chloride: 98 mmol/L (ref 96–106)
Creatinine, Ser: 2.28 mg/dL — ABNORMAL HIGH (ref 0.57–1.00)
GFR calc Af Amer: 24 mL/min/{1.73_m2} — ABNORMAL LOW (ref >59)
GFR calc non Af Amer: 21 mL/min/{1.73_m2} — ABNORMAL LOW (ref >59)
Globulin, Total: 2.6 g/dL (ref 1.5–4.5)
Glucose: 390 mg/dL — ABNORMAL HIGH (ref 65–99)
Potassium: 4.1 mmol/L (ref 3.5–5.2)
Sodium: 140 mmol/L (ref 134–144)
Total Protein: 7.3 g/dL (ref 6.0–8.5)

## 2020-03-24 LAB — HEMOGLOBIN A1C
Est. average glucose Bld gHb Est-mCnc: 387 mg/dL
Hgb A1c MFr Bld: 15.1 % — ABNORMAL HIGH (ref 4.8–5.6)

## 2020-03-24 LAB — LIPID PANEL
Chol/HDL Ratio: 2.5 ratio (ref 0.0–4.4)
Cholesterol, Total: 173 mg/dL (ref 100–199)
HDL: 68 mg/dL (ref >39)
LDL Chol Calc (NIH): 90 mg/dL (ref 0–99)
Triglycerides: 81 mg/dL (ref 0–149)
VLDL Cholesterol Cal: 15 mg/dL (ref 5–40)

## 2020-03-24 LAB — MICROALBUMIN / CREATININE URINE RATIO
Creatinine, Urine: 90.5 mg/dL
Microalb/Creat Ratio: 31 mg/g creat — ABNORMAL HIGH (ref 0–29)
Microalbumin, Urine: 28.1 ug/mL

## 2020-03-25 ENCOUNTER — Ambulatory Visit: Payer: Medicare HMO | Attending: Internal Medicine

## 2020-03-25 ENCOUNTER — Other Ambulatory Visit: Payer: Self-pay | Admitting: Family Medicine

## 2020-03-25 DIAGNOSIS — Z23 Encounter for immunization: Secondary | ICD-10-CM

## 2020-03-25 MED ORDER — TRULICITY 0.75 MG/0.5ML ~~LOC~~ SOAJ: 0.7500 mg | SUBCUTANEOUS | 3 refills | Status: DC

## 2020-03-25 NOTE — Progress Notes (Signed)
   Covid-19 Vaccination Clinic  Name:  Andrea Brown    MRN: [id number] DOB: [birth year]  03/25/2020  [name] was observed post Covid-19 immunization for 15 minutes without incident. She was provided with Vaccine Information Sheet and instruction to access the V-Safe system.   [name] was instructed to call 911 with any severe reactions post vaccine: Marland Kitchen Difficulty breathing  . Swelling of face and throat  . A fast heartbeat  . A bad rash all over body  . Dizziness and weakness   Immunizations Administered    Name Date Dose VIS Date Route   Pfizer COVID-19 Vaccine 03/25/2020  8:40 AM 0.3 mL 01/28/2019 Intramuscular   Manufacturer: [place]   Lot: [id number]   [place]: [id number]

## 2020-03-30 ENCOUNTER — Other Ambulatory Visit: Payer: Self-pay

## 2020-03-30 ENCOUNTER — Ambulatory Visit: Payer: Medicare HMO | Attending: Family Medicine | Admitting: Pharmacist

## 2020-03-30 ENCOUNTER — Encounter: Payer: Self-pay | Admitting: Pharmacist

## 2020-03-30 DIAGNOSIS — Z7189 Other specified counseling: Secondary | ICD-10-CM | POA: Diagnosis not present

## 2020-03-30 NOTE — Progress Notes (Signed)
Patient was educated on the use of the Accu Chek blood glucose meter. Reviewed necessary supplies and operation of the meter. Unfortunately, pt does not have her test strips with her. I did review how to use the test strips with my own practice meter. Also reviewed goal blood glucose levels. Patient was able to demonstrate use. All questions and concerns were addressed.  Patient was educated on the use of the Trulicity pen. Reviewed necessary supplies and operation of the pen. She has a pen with her but she wants to wait and inject at home. All questions and concerns were addressed.  Benard Halsted, PharmD, [place] [phone]

## 2020-04-05 ENCOUNTER — Other Ambulatory Visit: Payer: Self-pay | Admitting: Pharmacist

## 2020-04-05 DIAGNOSIS — E1121 Type 2 diabetes mellitus with diabetic nephropathy: Secondary | ICD-10-CM

## 2020-04-05 MED ORDER — ACCU-CHEK GUIDE VI STRP: ORAL_STRIP | 11 refills | Status: DC

## 2020-04-09 DIAGNOSIS — F17200 Nicotine dependence, unspecified, uncomplicated: Secondary | ICD-10-CM | POA: Diagnosis not present

## 2020-04-09 DIAGNOSIS — N184 Chronic kidney disease, stage 4 (severe): Secondary | ICD-10-CM | POA: Diagnosis not present

## 2020-04-09 DIAGNOSIS — E1122 Type 2 diabetes mellitus with diabetic chronic kidney disease: Secondary | ICD-10-CM | POA: Diagnosis not present

## 2020-04-09 DIAGNOSIS — I129 Hypertensive chronic kidney disease with stage 1 through stage 4 chronic kidney disease, or unspecified chronic kidney disease: Secondary | ICD-10-CM | POA: Diagnosis not present

## 2020-04-13 ENCOUNTER — Ambulatory Visit: Payer: Medicare HMO | Admitting: Pharmacist

## 2020-04-14 ENCOUNTER — Other Ambulatory Visit: Payer: Self-pay

## 2020-04-14 ENCOUNTER — Encounter: Payer: Self-pay | Admitting: Pharmacist

## 2020-04-14 ENCOUNTER — Ambulatory Visit: Payer: Medicare HMO | Attending: Family Medicine | Admitting: Pharmacist

## 2020-04-14 DIAGNOSIS — Z7189 Other specified counseling: Secondary | ICD-10-CM

## 2020-04-14 NOTE — Progress Notes (Signed)
Patient was educated on the use of the Accu Chek blood glucose meter. Reviewed necessary supplies and operation of the meter. Also reviewed goal blood glucose levels - test result using her meter was 340. Patient was able to demonstrate use. All questions and concerns were addressed.  Of note, we gave patient her first Trulicity injection in clinic. Pt tolerated well and was able to inject herself. All questions and concerns addressed.   Benard Halsted, PharmD, [place] [phone]

## 2020-04-16 ENCOUNTER — Other Ambulatory Visit: Payer: Self-pay | Admitting: Family Medicine

## 2020-04-19 ENCOUNTER — Ambulatory Visit: Payer: Medicare HMO | Attending: Internal Medicine

## 2020-04-19 DIAGNOSIS — Z23 Encounter for immunization: Secondary | ICD-10-CM

## 2020-04-19 NOTE — Progress Notes (Signed)
   Covid-19 Vaccination Clinic  Name:  Andrea Brown    MRN: [id number] DOB: July 06, 1951  04/19/2020  Andrea Brown was observed post Covid-19 immunization for 15 minutes without incident. She was provided with Vaccine Information Sheet and instruction to access the V-Safe system.   Andrea Brown was instructed to call 911 with any severe reactions post vaccine: Marland Kitchen Difficulty breathing  . Swelling of face and throat  . A fast heartbeat  . A bad rash all over body  . Dizziness and weakness   Immunizations Administered    Name Date Dose VIS Date Route   Pfizer COVID-19 Vaccine 04/19/2020  8:29 AM 0.3 mL 01/28/2019 Intramuscular   Manufacturer: [place]   Lot: [id number]   [place]: [id number]

## 2020-05-24 ENCOUNTER — Ambulatory Visit: Payer: Medicare HMO | Attending: Family Medicine | Admitting: Family Medicine

## 2020-05-24 ENCOUNTER — Ambulatory Visit (HOSPITAL_BASED_OUTPATIENT_CLINIC_OR_DEPARTMENT_OTHER): Payer: Medicare HMO | Admitting: Pharmacist

## 2020-05-24 ENCOUNTER — Encounter: Payer: Self-pay | Admitting: Family Medicine

## 2020-05-24 VITALS — BP 124/63 | HR 71 | Ht 61.0 in | Wt 133.2 lb

## 2020-05-24 VITALS — BP 124/63 | HR 71

## 2020-05-24 DIAGNOSIS — E1121 Type 2 diabetes mellitus with diabetic nephropathy: Secondary | ICD-10-CM

## 2020-05-24 DIAGNOSIS — Z23 Encounter for immunization: Secondary | ICD-10-CM | POA: Diagnosis not present

## 2020-05-24 DIAGNOSIS — Z0000 Encounter for general adult medical examination without abnormal findings: Secondary | ICD-10-CM | POA: Diagnosis not present

## 2020-05-24 DIAGNOSIS — F17210 Nicotine dependence, cigarettes, uncomplicated: Secondary | ICD-10-CM

## 2020-05-24 DIAGNOSIS — Z720 Tobacco use: Secondary | ICD-10-CM

## 2020-05-24 NOTE — Patient Instructions (Signed)
  Ms. Andrea Brown , Thank you for taking time to come for your Medicare Wellness Visit. I appreciate your ongoing commitment to your health goals. Please review the following plan we discussed and let me know if I can assist you in the future.   These are the goals we discussed: Goals   None     This is a list of the screening recommended for you and due dates:  Health Maintenance  Topic Date Due  . Eye exam for diabetics  Never done  . Tetanus Vaccine  Never done  . DEXA scan (bone density measurement)  Never done  . Pneumonia vaccines (2 of 2 - PPSV23) 04/23/2019  . Flu Shot  07/04/2020  . Hemoglobin A1C  09/22/2020  . Complete foot exam   03/23/2021  . Urine Protein Check  03/23/2021  . Mammogram  12/09/2021  . Colon Cancer Screening  08/19/2024  . COVID-19 Vaccine  Completed  .  Hepatitis C: One time screening is recommended by Center for Disease Control  (CDC) for  adults born from [birth year] through 1967.   Completed

## 2020-05-24 NOTE — Progress Notes (Signed)
Subjective:   Andrea Brown is a 69 y.o. female who presents for Medicare Annual (Subsequent) preventive examination. 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 This was found to be concordant by Dr. Dorise Bullion, with excision Recommended.  She is due for bone density study but has no interest in pursuing this. Continues to smoke close to a pack of cigarettes per day and has smoked for 42 years and is not ready to quit. Last colonoscopy was in 08/2014 and is not due until 08/2024  Review of Systems    General: negative for fever, weight loss, appetite change Eyes: no visual symptoms. ENT: no ear symptoms, no sinus tenderness, no nasal congestion or sore throat. Neck: no pain  Respiratory: no wheezing, shortness of breath, cough Cardiovascular: no chest pain, no dyspnea on exertion, no pedal edema, no orthopnea. Gastrointestinal: no abdominal pain, no diarrhea, no constipation Genito-Urinary: no urinary frequency, no dysuria, no polyuria. Hematologic: no bruising Endocrine: no cold or heat intolerance Neurological: no headaches, no seizures, no tremors Musculoskeletal: no joint pains, no joint swelling Skin: no pruritus, no rash. Psychological: no depression, no anxiety,          Objective:    Today's Vitals   05/24/20 0947  BP: 124/63  Pulse: 71  SpO2: 99%  Weight: 133 lb 3.2 oz (60.4 kg)  Height: _0  (1.549 m)   Body mass index is 25.17 kg/m.  Advanced Directives 05/24/2020 02/23/2020 02/13/2020 10/05/2016 08/05/2014  Does Patient Have a Medical Advance Directive? _1   Would patient like information on creating a medical advance directive? - No - Patient declined No - Patient declined No - patient declined information -    Current Medications (verified) Outpatient Encounter  Medications as of 05/24/2020  Medication Sig  . amLODipine (NORVASC) 10 MG tablet Take 1 tablet (10 mg total) by mouth daily.  Marland Kitchen aspirin 81 MG tablet Take 1 tablet (81 mg total) daily by mouth.  . Blood Glucose Monitoring Suppl (ACCU-CHEK GUIDE ME) w/Device KIT 1 each by Does not apply route 3 (three) times daily before meals.  Marland Kitchen glipiZIDE (GLUCOTROL) 10 MG tablet Take 1 tablet (10 mg total) by mouth 2 (two) times daily before a meal.  . glucose blood (ACCU-CHEK GUIDE) test strip Use as instructed 1 to 2 times per day. E11.21  . hydrochlorothiazide (HYDRODIURIL) 25 MG tablet Take 1 tablet (25 mg total) by mouth daily.  . isosorbide mononitrate (IMDUR) 60 MG 24 hr tablet Take 1 tablet (60 mg total) by mouth daily.  . Misc. Devices MISC Blood pressure monitor; diagnosis-hypertension  . simvastatin (ZOCOR) 10 MG tablet Take 1 tablet (10 mg total) by mouth daily at 6 PM.  . TRULICITY 9.75 OI/3.2PQ SOPN INJECT 0.75 MG INTO THE SKIN ONCE A WEEK.  Marland Kitchen oxyCODONE (OXY IR/ROXICODONE) 5 MG immediate release tablet Take 1-2 tablets (5-10 mg total) by mouth every 6 (six) hours as needed for moderate pain, severe pain or breakthrough pain. (Patient not taking: Reported on 03/23/2020)   No facility-administered encounter medications on file as of 05/24/2020.    Allergies (verified) Patient has no known allergies.   History: Past Medical History:  Diagnosis Date  . 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 Diabetes mellitus without complication ([place])   . Hyperlipidemia   . Hypertension   . Papilloma  of right breast   . Smoker    Past Surgical History:  Procedure Laterality Date  . BREAST LUMPECTOMY WITH RADIOACTIVE SEED LOCALIZATION Right 02/23/2020   Procedure: RIGHT BREAST LUMPECTOMY X 3 WITH RADIOACTIVE SEED LOCALIZATION;  Surgeon: Jovita Kussmaul, MD;  Location: [facility];  Service: General;  Laterality: Right;  . TOTAL ABDOMINAL HYSTERECTOMY  2000   Family History  Problem  Relation Age of Onset  . Diabetes Mother   . Hypertension Mother   . Colon cancer Neg Hx   . Esophageal cancer Neg Hx   . Pancreatic cancer Neg Hx   . Rectal cancer Neg Hx   . Stomach cancer Neg Hx    Social History   Socioeconomic History  . Marital status: Single    Spouse name: Not on file  . Number of children: Not on file  . Years of education: Not on file  . Highest education level: Not on file  Occupational History  . Not on file  Tobacco Use  . Smoking status: Current Every Day Smoker    Packs/day: 0.25    Types: Cigarettes  . Smokeless tobacco: Never Used  . Tobacco comment: patient is trying EOD  Substance and Sexual Activity  . Alcohol use: No  . Drug use: No  . Sexual activity: Not Currently    Birth control/protection: Surgical  Other Topics Concern  . Not on file  Social History Narrative  . Not on file   Social Determinants of Health   Financial Resource Strain:   . Difficulty of Paying Living Expenses:   Food Insecurity:   . Worried About Charity fundraiser in the Last Year:   . Arboriculturist in the Last Year:   Transportation Needs:   . Film/video editor (Medical):   Marland Kitchen Lack of Transportation (Non-Medical):   Physical Activity:   . Days of Exercise per Week:   . Minutes of Exercise per Session:   Stress:   . Feeling of Stress :   Social Connections:   . Frequency of Communication with Friends and Family:   . Frequency of Social Gatherings with Friends and Family:   . Attends Religious Services:   . Active Member of Clubs or Organizations:   . Attends Archivist Meetings:   Marland Kitchen Marital Status:     Tobacco Counseling Ready to quit: No Counseling given: Yes Comment: patient is trying EOD   Clinical Intake:  Pre-visit preparation completed: Yes  Pain : No/denies pain          Diabetic?yes  Interpreter Needed?: No      Activities of Daily Living In your present state of health, do you have any difficulty  performing the following activities: 05/24/2020 02/23/2020  Hearing? N N  Vision? N N  Difficulty concentrating or making decisions? N N  Walking or climbing stairs? N N  Dressing or bathing? N N  Doing errands, shopping? N -  Some recent data might be hidden    Patient Care Team: Charlott Rakes, MD as PCP - General (Family Medicine)  Indicate any recent Medical Services you may have received from other than [facility] providers in the past year (date may be approximate).     Assessment:   This is a routine wellness examination for Hajer.  Hearing/Vision screen No exam data present  Dietary issues and exercise activities discussed:    Goals   None    Depression Screen [facility] 2/9 Scores 05/24/2020 03/23/2020  01/23/2019 01/23/2018 10/17/2017 10/05/2016 06/08/2014  PHQ - 2 Score 0 0 0 - 0 0 0  PHQ- 9 Score - 0 0 - - - -  Exception Documentation - - - Patient refusal - - -    Fall Risk Fall Risk  05/24/2020 03/23/2020 09/10/2019 01/23/2019 07/23/2018  Falls in the past year? 0 0 0 0 No  Risk for fall due to : No Fall Risks - - - -    Any stairs in or around the home? No  If so, are there any without handrails? No  Home free of loose throw rugs in walkways, pet beds, electrical cords, etc? Yes  Adequate lighting in your home to reduce risk of falls? Yes   ASSISTIVE DEVICES UTILIZED TO PREVENT FALLS:  Life alert? No  Use of a cane, walker or w/c? No  Grab bars in the bathroom? No  Shower chair or bench in shower? No  Elevated toilet seat or a handicapped toilet? No   TIMED UP AND GO:  Was the test performed? Yes .  Length of time to ambulate 10 feet: 11 sec.   Gait steady and fast without use of assistive device  Cognitive Function: MMSE - Mini Mental State Exam 05/24/2020  Orientation to time 5  Orientation to Place 5  Registration 3  Attention/ Calculation 5  Recall 3  Language- name 2 objects 2  Language- repeat 1  Language- follow 3 step command 3  Language- read & follow  direction 1  Write a sentence 1  Copy design 1  Total score 30        Immunizations Immunization History  Administered Date(s) Administered  . PFIZER SARS-COV-2 Vaccination 03/25/2020, 04/19/2020  . Pneumococcal Conjugate-13 04/22/2018    TDAP status: Up to date Flu Vaccine status: Up to date Pneumococcal vaccine status: Completed during today's visit. Covid-19 vaccine status: Completed vaccines  Qualifies for Shingles Vaccine? Yes   Zostavax completed No   Shingrix Completed?: No.    Education has been provided regarding the importance of this vaccine. Patient has been advised to call insurance company to determine out of pocket expense if they have not yet received this vaccine. Advised may also receive vaccine at local pharmacy or Health Dept. Verbalized acceptance and understanding.  Screening Tests Health Maintenance  Topic Date Due  . OPHTHALMOLOGY EXAM  Never done  . TETANUS/TDAP  Never done  . DEXA SCAN  Never done  . PNA vac Low Risk Adult (2 of 2 - PPSV23) 04/23/2019  . INFLUENZA VACCINE  07/04/2020  . HEMOGLOBIN A1C  09/22/2020  . FOOT EXAM  03/23/2021  . URINE MICROALBUMIN  03/23/2021  . MAMMOGRAM  12/09/2021  . COLONOSCOPY  08/19/2024  . COVID-19 Vaccine  Completed  . Hepatitis C Screening  Completed    Health Maintenance  Health Maintenance Due  Topic Date Due  . OPHTHALMOLOGY EXAM  Never done  . TETANUS/TDAP  Never done  . DEXA SCAN  Never done  . PNA vac Low Risk Adult (2 of 2 - PPSV23) 04/23/2019    Colorectal cancer screening: Completed 2015. Repeat every 10 years Mammogram status: Completed 2020. Repeat every year Declines bone density  Lung Cancer Screening: (Low Dose CT Chest recommended if Age 15-80 years, 30 pack-year currently smoking OR have quit w/in 15years.) does qualify.   Lung Cancer Screening Referral: Performed  Additional Screening:  Hepatitis C Screening: does qualify; Completed yes  Vision Screening: Recommended annual  ophthalmology exams for early detection of  glaucoma and other disorders of the eye. Is the patient up to date with their annual eye exam?  No  Who is the provider or what is the name of the office in which the patient attends annual eye exams? None If pt is not established with a provider, would they like to be referred to a provider to establish care? Referred.   Dental Screening: Recommended annual dental exams for proper oral hygiene  Community Resource Referral / Chronic Care Management: CRR required this visit?   No  CCM required this visit?  No      Plan:    1. Encounter for Medicare annual wellness exam Counseled on 150 minutes of exercise per week, healthy eating (including decreased daily intake of saturated fats, cholesterol, added sugars, sodium), routine healthcare maintenance.   2. Tobacco abuse Spent 3 minutes counseling on smoking cessation and has adverse effect of smoking and she is not ready to quit - CT CHEST LUNG CA SCREEN LOW DOSE W/O CM; Future   I have personally reviewed and noted the following in the patient's chart:   . Medical and social history . Use of alcohol, tobacco or illicit drugs  . Current medications and supplements . Functional ability and status . Nutritional status . Physical activity . Advanced directives . List of other physicians . Hospitalizations, surgeries, and ER visits in previous 12 months . Vitals . Screenings to include cognitive, depression, and falls . Referrals and appointments  In addition, I have reviewed and discussed with patient certain preventive protocols, quality metrics, and best practice recommendations. A written personalized care plan for preventive services as well as general preventive health recommendations were provided to patient.    Return in about 1 month (around 06/23/2020) for Chronic disease management.  Charlott Rakes, MD   05/24/2020

## 2020-05-24 NOTE — Progress Notes (Signed)
    S:    PCP: Dr. Margarita Rana   No chief complaint on file.  Patient arrives in good spirits.  Presents for diabetes evaluation, education, and management Patient was referred and last seen by Primary Care Provider on 03/23/2020.    Patient reports Diabetes was diagnosed in 2000.   Family/Social History:  -FHx: DM, HTN -Tobacco: current everyday smoker -Alcohol:  Denies use   Insurance coverage/medication affordability: [employer]  Medication adherence reported.   Current diabetes medications include: glipizide 10 mg BID, Trulicity 5.00 mg weekly Current hypertension medications include: amlodipine 10 mg daily, HCTZ 25 mg daily, Imdur 60 mg daily  Current hyperlipidemia medications include: simvastatin 10 mg daily   Patient denies hypoglycemic events.  Patient reported dietary habits:  - Limits sodium - Denies excessive intake of sweets - Reports a meal last night of: baked chicken, rice, and green beans   Patient-reported exercise habits:  - walking daily in the morning and night.    Patient denies polyuria, polydipsia.  Patient reports baseline neuropathy (nerve pain). Patient denies visual changes. Patient reports self foot exams.    O:  POCT: 99  Lab Results  Component Value Date   HGBA1C 15.1 (H) 03/23/2020   Vitals:   05/24/20 0922  BP: 124/63  Pulse: 71   Lipid Panel     Component Value Date/Time   CHOL 173 03/23/2020 1058   TRIG 81 03/23/2020 1058   HDL 68 03/23/2020 1058   CHOLHDL 2.5 03/23/2020 1058   CHOLHDL 3.7 10/05/2016 1048   VLDL 19 10/05/2016 1048   LDLCALC 90 03/23/2020 1058   LDLDIRECT 40 09/09/2009 2145   Home fasting blood sugars: not checking every day but reports levels in the morning now 90-100   Clinical Atherosclerotic Cardiovascular Disease (ASCVD): No  The 10-year ASCVD risk score Mikey Bussing DC Jr., et al., 2013) is: 42.1%   Values used to calculate the score:     Age: 69 years     Sex: Female     Is Non-Hispanic African American:  Yes     Diabetic: Yes     Tobacco smoker: Yes     Systolic Blood Pressure: 938 mmHg     Is BP treated: Yes     HDL Cholesterol: 68 mg/dL     Total Cholesterol: 173 mg/dL    A/P: Diabetes longstanding currently uncontrolled however home sugars have improved. Patient is able to verbalize appropriate hypoglycemia management plan. Medication adherence reported. -Continued current regimen. -Extensively discussed pathophysiology of diabetes, recommended lifestyle interventions, dietary effects on blood sugar control -Counseled on s/sx of and management of hypoglycemia -Next A1C anticipated 06/2020.   Written patient instructions provided.  Total time in face to face counseling 30 minutes.   Follow up with PCP.  Benard Halsted, PharmD, [place] [phone]

## 2020-05-25 ENCOUNTER — Encounter: Payer: Self-pay | Admitting: Pharmacist

## 2020-05-25 LAB — POCT CBG (FASTING - GLUCOSE)-MANUAL ENTRY: Glucose Fasting, POC: 99 mg/dL (ref 70–99)

## 2020-06-09 ENCOUNTER — Ambulatory Visit (HOSPITAL_COMMUNITY): Payer: Medicare HMO

## 2020-06-14 ENCOUNTER — Other Ambulatory Visit: Payer: Self-pay | Admitting: Pharmacist

## 2020-06-14 MED ORDER — TRULICITY 0.75 MG/0.5ML ~~LOC~~ SOAJ: 0.7500 mg | SUBCUTANEOUS | 2 refills | Status: DC

## 2020-06-24 ENCOUNTER — Other Ambulatory Visit: Payer: Self-pay

## 2020-06-24 ENCOUNTER — Encounter: Payer: Self-pay | Admitting: Family Medicine

## 2020-06-24 ENCOUNTER — Ambulatory Visit: Payer: Medicare HMO | Attending: Family Medicine | Admitting: Family Medicine

## 2020-06-24 VITALS — BP 142/66 | HR 68 | Ht 61.0 in | Wt 132.0 lb

## 2020-06-24 DIAGNOSIS — E785 Hyperlipidemia, unspecified: Secondary | ICD-10-CM

## 2020-06-24 DIAGNOSIS — N184 Chronic kidney disease, stage 4 (severe): Secondary | ICD-10-CM

## 2020-06-24 DIAGNOSIS — E1122 Type 2 diabetes mellitus with diabetic chronic kidney disease: Secondary | ICD-10-CM | POA: Diagnosis not present

## 2020-06-24 DIAGNOSIS — I10 Essential (primary) hypertension: Secondary | ICD-10-CM

## 2020-06-24 DIAGNOSIS — E1121 Type 2 diabetes mellitus with diabetic nephropathy: Secondary | ICD-10-CM

## 2020-06-24 LAB — POCT GLYCOSYLATED HEMOGLOBIN (HGB A1C): HbA1c, POC (controlled diabetic range): 7.4 % — AB (ref 0.0–7.0)

## 2020-06-24 LAB — GLUCOSE, POCT (MANUAL RESULT ENTRY): POC Glucose: 78 mg/dl (ref 70–99)

## 2020-06-24 MED ORDER — HYDROCHLOROTHIAZIDE 25 MG PO TABS: 25.0000 mg | ORAL_TABLET | Freq: Every day | ORAL | 1 refills | Status: DC

## 2020-06-24 MED ORDER — SIMVASTATIN 10 MG PO TABS: 10.0000 mg | ORAL_TABLET | Freq: Every day | ORAL | 1 refills | Status: DC

## 2020-06-24 MED ORDER — TRULICITY 0.75 MG/0.5ML ~~LOC~~ SOAJ: 0.7500 mg | SUBCUTANEOUS | 6 refills | Status: DC

## 2020-06-24 MED ORDER — AMLODIPINE BESYLATE 10 MG PO TABS: 10.0000 mg | ORAL_TABLET | Freq: Every day | ORAL | 1 refills | Status: DC

## 2020-06-24 MED ORDER — GLIPIZIDE 10 MG PO TABS: 10.0000 mg | ORAL_TABLET | Freq: Two times a day (BID) | ORAL | 1 refills | Status: DC

## 2020-06-24 MED ORDER — ISOSORBIDE MONONITRATE ER 60 MG PO TB24: 60.0000 mg | ORAL_TABLET | Freq: Every day | ORAL | 1 refills | Status: DC

## 2020-06-24 NOTE — Progress Notes (Signed)
Subjective:  Patient ID: Andrea Brown, female    DOB: 08-26-1951  Age: 69 y.o. MRN: [id number]  CC: Diabetes   HPI Andrea Brown is 69 yo female with a medical history of Diabetes Mellitus Type 2 (A1c 7.4), Hypertension, Hyperlipidemia, CKD stage 4 and tobacco use  that comes into the office today for 6 month  follow up.  Her A1c is 7.4 down 15.1 she denies presence of hypoglycemia, numbness in extremities or visual concerns.  She walks daily for exercise and is compliant with her medication. Doing well on her antihypertensive and her statin Denies problems with her medications. Sees Nephrology next month for management of her chronic kidney disease. She has no acute concerns today. Due for bone density study which she declines. Past Medical History:  Diagnosis Date  . 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 Diabetes mellitus without complication ([place])   . Hyperlipidemia   . Hypertension   . Papilloma of right breast   . Smoker     Past Surgical History:  Procedure Laterality Date  . BREAST LUMPECTOMY WITH RADIOACTIVE SEED LOCALIZATION Right 02/23/2020   Procedure: RIGHT BREAST LUMPECTOMY X 3 WITH RADIOACTIVE SEED LOCALIZATION;  Surgeon: Jovita Kussmaul, MD;  Location: [facility];  Service: General;  Laterality: Right;  . TOTAL ABDOMINAL HYSTERECTOMY  2000    Family History  Problem Relation Age of Onset  . Diabetes Mother   . Hypertension Mother   . Colon cancer Neg Hx   . Esophageal cancer Neg Hx   . Pancreatic cancer Neg Hx   . Rectal cancer Neg Hx   . Stomach cancer Neg Hx     No Known Allergies  Outpatient Medications Prior to Visit  Medication Sig Dispense Refill  . aspirin 81 MG tablet Take 1 tablet (81 mg total) daily by mouth. 30 tablet 5  . Blood Glucose Monitoring Suppl (ACCU-CHEK GUIDE ME) w/Device KIT 1 each by Does not apply route 3 (three) times daily before meals. 1 kit 0  . glucose blood (ACCU-CHEK GUIDE) test strip Use as instructed 1  to 2 times per day. E11.21 50 each 11  . Misc. Devices MISC Blood pressure monitor; diagnosis-hypertension 1 each 0  . amLODipine (NORVASC) 10 MG tablet Take 1 tablet (10 mg total) by mouth daily. 90 tablet 1  . Dulaglutide (TRULICITY) 3.22 GU/5.4YH SOPN Inject 0.5 mLs (0.75 mg total) into the skin once a week. 2 mL 2  . glipiZIDE (GLUCOTROL) 10 MG tablet Take 1 tablet (10 mg total) by mouth 2 (two) times daily before a meal. 180 tablet 1  . hydrochlorothiazide (HYDRODIURIL) 25 MG tablet Take 1 tablet (25 mg total) by mouth daily. 90 tablet 1  . isosorbide mononitrate (IMDUR) 60 MG 24 hr tablet Take 1 tablet (60 mg total) by mouth daily. 90 tablet 1  . simvastatin (ZOCOR) 10 MG tablet Take 1 tablet (10 mg total) by mouth daily at 6 PM. 90 tablet 3  . oxyCODONE (OXY IR/ROXICODONE) 5 MG immediate release tablet Take 1-2 tablets (5-10 mg total) by mouth every 6 (six) hours as needed for moderate pain, severe pain or breakthrough pain. (Patient not taking: Reported on 03/23/2020) 10 tablet 0   No facility-administered medications prior to visit.     ROS Review of Systems  Constitutional: Negative for activity change, appetite change and fatigue.  HENT: Negative for congestion, sinus pressure and sore throat.   Eyes: Negative for visual disturbance.  Respiratory:  Negative for cough, chest tightness, shortness of breath and wheezing.   Cardiovascular: Negative for chest pain and palpitations.  Gastrointestinal: Negative for abdominal distention, abdominal pain and constipation.  Endocrine: Negative for polydipsia.  Genitourinary: Negative for dysuria and frequency.  Musculoskeletal: Negative for arthralgias and back pain.  Skin: Negative for rash.  Neurological: Negative for tremors, light-headedness and numbness.  Hematological: Does not bruise/bleed easily.  Psychiatric/Behavioral: Negative for agitation and behavioral problems.    Objective:  BP (!) 142/66   Pulse 68   Ht 5' 1"  (1.549  m)   Wt 132 lb (59.9 kg)   SpO2 100%   BMI 24.94 kg/m   BP/Weight 06/24/2020 05/24/2020 12/14/7354  Systolic BP 701 410 301  Diastolic BP 66 63 63  Wt. (Lbs) 132 - 133.2  BMI 24.94 - 25.17      Physical Exam Constitutional:      Appearance: She is well-developed.  Neck:     Vascular: No JVD.  Cardiovascular:     Rate and Rhythm: Normal rate.     Heart sounds: Normal heart sounds. No murmur heard.   Pulmonary:     Effort: Pulmonary effort is normal.     Breath sounds: Normal breath sounds. No wheezing or rales.  Chest:     Chest wall: No tenderness.  Abdominal:     General: Bowel sounds are normal. There is no distension.     Palpations: Abdomen is soft. There is no mass.     Tenderness: There is no abdominal tenderness.  Musculoskeletal:        General: Normal range of motion.     Right lower leg: No edema.     Left lower leg: No edema.  Neurological:     Mental Status: She is alert and oriented to person, place, and time.  Psychiatric:        Mood and Affect: Mood normal.     CMP Latest Ref Rng & Units 03/23/2020 02/23/2020 09/11/2019  Glucose 65 - 99 mg/dL 390(H) 385(H) 292(H)  BUN 8 - 27 mg/dL 39(H) 35(H) 41(H)  Creatinine 0.57 - 1.00 mg/dL 2.28(H) 2.30(H) 2.05(H)  Sodium 134 - 144 mmol/L 140 137 139  Potassium 3.5 - 5.2 mmol/L 4.1 4.5 4.3  Chloride 96 - 106 mmol/L 98 98 100  CO2 20 - 29 mmol/L 22 27 26   Calcium 8.7 - 10.3 mg/dL 10.2 9.7 9.6  Total Protein 6.0 - 8.5 g/dL 7.3 - 6.9  Total Bilirubin 0.0 - 1.2 mg/dL 0.3 - 0.3  Alkaline Phos 39 - 117 IU/L 82 - 66  AST 0 - 40 IU/L 18 - 15  ALT 0 - 32 IU/L 14 - 13    Lipid Panel     Component Value Date/Time   CHOL 173 03/23/2020 1058   TRIG 81 03/23/2020 1058   HDL 68 03/23/2020 1058   CHOLHDL 2.5 03/23/2020 1058   CHOLHDL 3.7 10/05/2016 1048   VLDL 19 10/05/2016 1048   LDLCALC 90 03/23/2020 1058   LDLDIRECT 40 09/09/2009 2145    CBC    Component Value Date/Time   WBC 4.3 10/17/2017 0933   WBC 5.5  10/05/2016 1048   RBC 3.31 (L) 10/17/2017 0933   RBC 3.97 10/05/2016 1048   HGB 9.4 (L) 10/17/2017 0933   HCT 28.8 (L) 10/17/2017 0933   PLT 191 10/17/2017 0933   MCV 87 10/17/2017 0933   MCH 28.4 10/17/2017 0933   MCH 28.2 10/05/2016 1048   MCHC 32.6 10/17/2017 0933  MCHC 32.7 10/05/2016 1048   RDW 14.8 10/17/2017 0933   LYMPHSABS 1.3 10/17/2017 0933   MONOABS 275 10/05/2016 1048   EOSABS 0.1 10/17/2017 0933   BASOSABS 0.0 10/17/2017 0933    Lab Results  Component Value Date   HGBA1C 7.4 (A) 06/24/2020    Assessment & Plan:  1. Type 2 diabetes mellitus with diabetic nephropathy, without long-term current use of insulin (HCC) Improved significantly with A1c of 7.4 which is down from 15.1 previously We will be cautious with regimen as she is prone to hypoglycemia due to chronic kidney disease Counseled on Diabetic diet, my plate method, 497 minutes of moderate intensity exercise/week Blood sugar logs with fasting goals of 80-120 mg/dl, random of less than 180 and in the event of sugars less than 60 mg/dl or greater than 400 mg/dl encouraged to notify the clinic. Advised on the need for annual eye exams, annual foot exams, Pneumonia vaccine. - POCT glucose (manual entry) - POCT glycosylated hemoglobin (Hb A1C) - Dulaglutide (TRULICITY) 5.30 YF/1.1MY SOPN; Inject 0.5 mLs (0.75 mg total) into the skin once a week.  Dispense: 2 mL; Refill: 6 - glipiZIDE (GLUCOTROL) 10 MG tablet; Take 1 tablet (10 mg total) by mouth 2 (two) times daily before a meal.  Dispense: 180 tablet; Refill: 1  2. Essential hypertension Slightly above goal No regimen change today Counseled on blood pressure goal of less than 130/80, low-sodium, DASH diet, medication compliance, 150 minutes of moderate intensity exercise per week. Discussed medication compliance, adverse effects. - amLODipine (NORVASC) 10 MG tablet; Take 1 tablet (10 mg total) by mouth daily.  Dispense: 90 tablet; Refill: 1 -  hydrochlorothiazide (HYDRODIURIL) 25 MG tablet; Take 1 tablet (25 mg total) by mouth daily.  Dispense: 90 tablet; Refill: 1 - isosorbide mononitrate (IMDUR) 60 MG 24 hr tablet; Take 1 tablet (60 mg total) by mouth daily.  Dispense: 90 tablet; Refill: 1  3. Dyslipidemia Controlled Low-cholesterol diet - simvastatin (ZOCOR) 10 MG tablet; Take 1 tablet (10 mg total) by mouth daily at 6 PM.  Dispense: 90 tablet; Refill: 1  4. 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 amLODipine (NORVASC) 10 MG tablet    Sig: Take 1 tablet (10 mg total) by mouth daily.    Dispense:  90 tablet    Refill:  1  . Dulaglutide (TRULICITY) 1.11 NB/5.6PO SOPN    Sig: Inject 0.5 mLs (0.75 mg total) into the skin once a week.    Dispense:  2 mL    Refill:  6  . glipiZIDE (GLUCOTROL) 10 MG tablet    Sig: Take 1 tablet (10 mg total) by mouth 2 (two) times daily before a meal.    Dispense:  180 tablet    Refill:  1    Dose increase  . hydrochlorothiazide (HYDRODIURIL) 25 MG tablet    Sig: Take 1 tablet (25 mg total) by mouth daily.    Dispense:  90 tablet    Refill:  1  . isosorbide mononitrate (IMDUR) 60 MG 24 hr tablet    Sig: Take 1 tablet (60 mg total) by mouth daily.    Dispense:  90 tablet    Refill:  1  . simvastatin (ZOCOR) 10 MG tablet    Sig: Take 1 tablet (10 mg total) by mouth daily at 6 PM.  Dispense:  90 tablet    Refill:  1    Follow-up: Return in about 6 months (around 12/25/2020) for Chronic disease management.       Charlott Rakes, MD, FAAFP. [facility] [place], [place]   06/24/2020, 9:13 AM

## 2020-07-14 DIAGNOSIS — E1122 Type 2 diabetes mellitus with diabetic chronic kidney disease: Secondary | ICD-10-CM | POA: Diagnosis not present

## 2020-07-14 DIAGNOSIS — F17200 Nicotine dependence, unspecified, uncomplicated: Secondary | ICD-10-CM | POA: Diagnosis not present

## 2020-07-14 DIAGNOSIS — I129 Hypertensive chronic kidney disease with stage 1 through stage 4 chronic kidney disease, or unspecified chronic kidney disease: Secondary | ICD-10-CM | POA: Diagnosis not present

## 2020-07-14 DIAGNOSIS — N184 Chronic kidney disease, stage 4 (severe): Secondary | ICD-10-CM | POA: Diagnosis not present

## 2020-07-28 ENCOUNTER — Other Ambulatory Visit (HOSPITAL_COMMUNITY): Payer: Self-pay | Admitting: *Deleted

## 2020-07-28 NOTE — Discharge Instructions (Signed)

## 2020-07-29 ENCOUNTER — Other Ambulatory Visit: Payer: Self-pay

## 2020-07-29 ENCOUNTER — Ambulatory Visit (HOSPITAL_COMMUNITY)
Admission: RE | Admit: 2020-07-29 | Discharge: 2020-07-29 | Disposition: A | Discharge status: Home / Self Care | Payer: Medicare HMO | Source: Ambulatory Visit | Attending: Nephrology | Admitting: Nephrology

## 2020-07-29 DIAGNOSIS — D631 Anemia in chronic kidney disease: Secondary | ICD-10-CM | POA: Insufficient documentation

## 2020-07-29 DIAGNOSIS — N184 Chronic kidney disease, stage 4 (severe): Secondary | ICD-10-CM | POA: Insufficient documentation

## 2020-07-29 MED ORDER — SODIUM CHLORIDE 0.9 % IV SOLN
510.0000 mg | INTRAVENOUS | Status: DC
  Administered 2020-07-29: 510 mg via INTRAVENOUS
  Filled 2020-07-29: qty 17

## 2020-08-05 ENCOUNTER — Ambulatory Visit (HOSPITAL_COMMUNITY)
Admission: RE | Admit: 2020-08-05 | Discharge: 2020-08-05 | Disposition: A | Discharge status: Home / Self Care | Payer: Medicare HMO | Source: Ambulatory Visit | Attending: Nephrology | Admitting: Nephrology

## 2020-08-05 ENCOUNTER — Other Ambulatory Visit: Payer: Self-pay

## 2020-08-05 DIAGNOSIS — D631 Anemia in chronic kidney disease: Secondary | ICD-10-CM | POA: Diagnosis not present

## 2020-08-05 DIAGNOSIS — N184 Chronic kidney disease, stage 4 (severe): Secondary | ICD-10-CM | POA: Diagnosis not present

## 2020-08-05 MED ORDER — SODIUM CHLORIDE 0.9 % IV SOLN
510.0000 mg | INTRAVENOUS | Status: DC
  Administered 2020-08-05: 510 mg via INTRAVENOUS
  Filled 2020-08-05: qty 17

## 2020-10-08 IMAGING — MG DIGITAL SCREENING BILATERAL MAMMOGRAM WITH TOMO AND CAD
8 series · 8 of 24 positions shown · non-contrast
Comparison: Previous exam(s).

CLINICAL DATA: Screening.

EXAM:
DIGITAL SCREENING BILATERAL MAMMOGRAM WITH TOMO AND CAD

[R CC synth-2D]
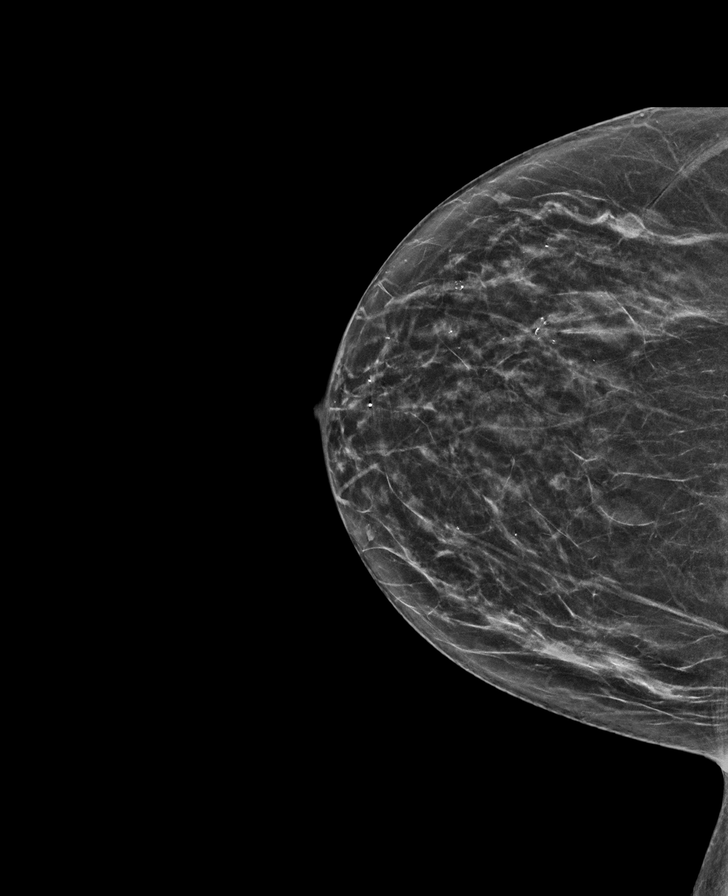

[L MLO synth-2D]
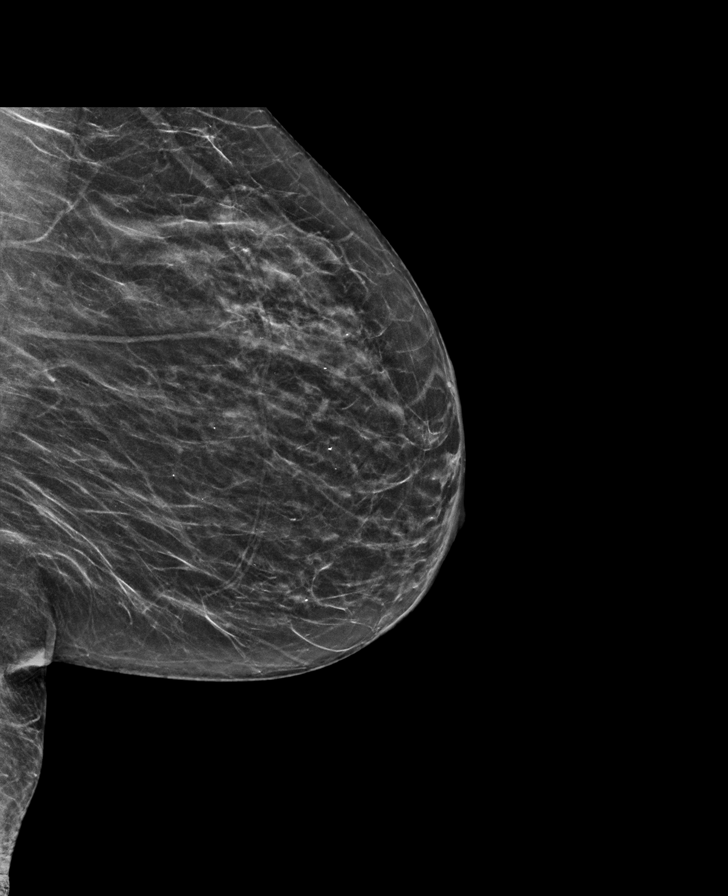

[L CC synth-2D]
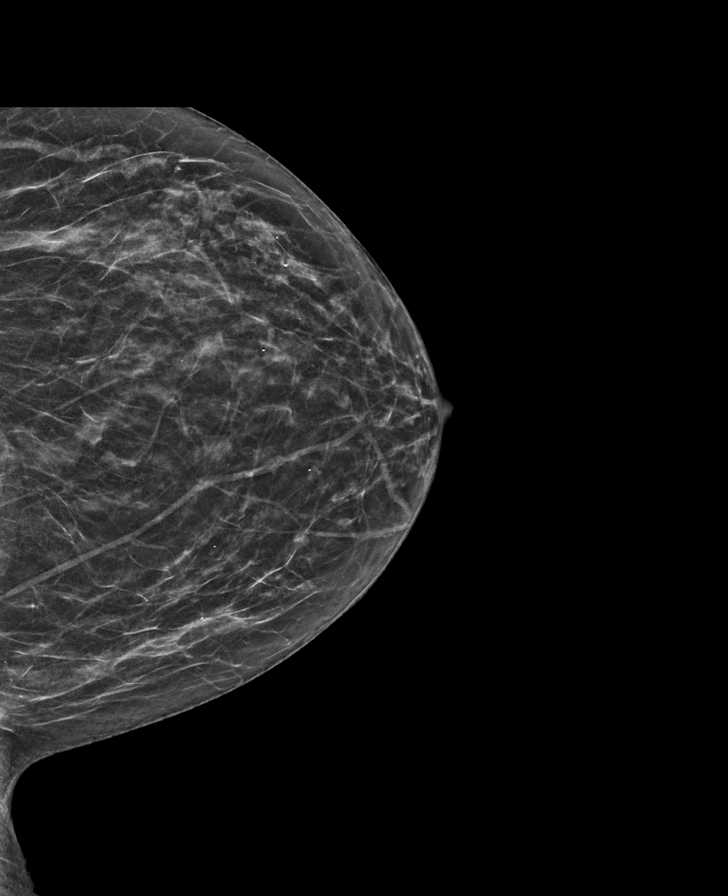

[R MLO synth-2D]
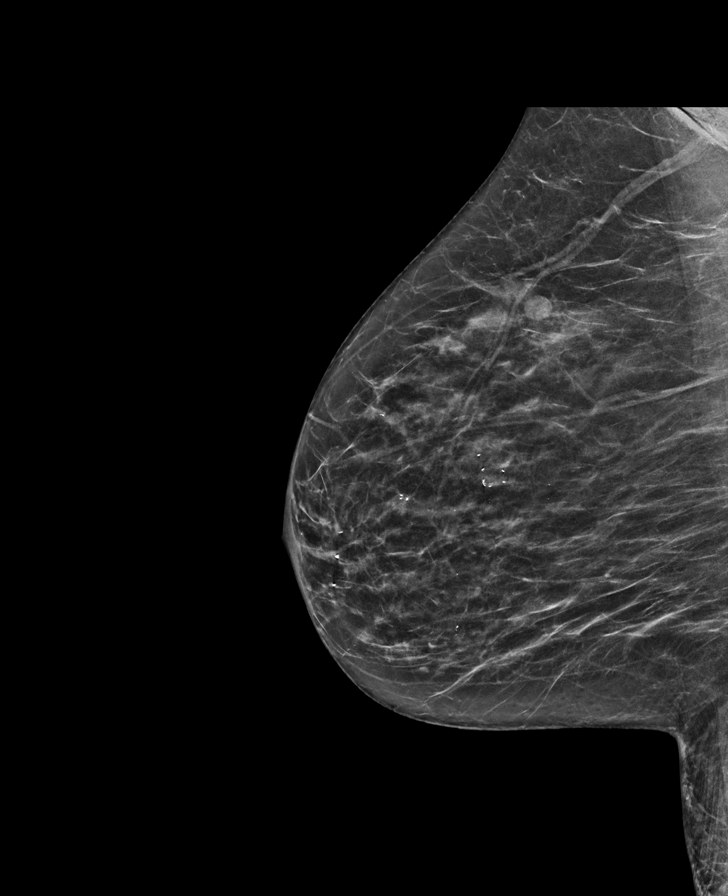

[R MLO tomo · tomo slice 29/58.0]
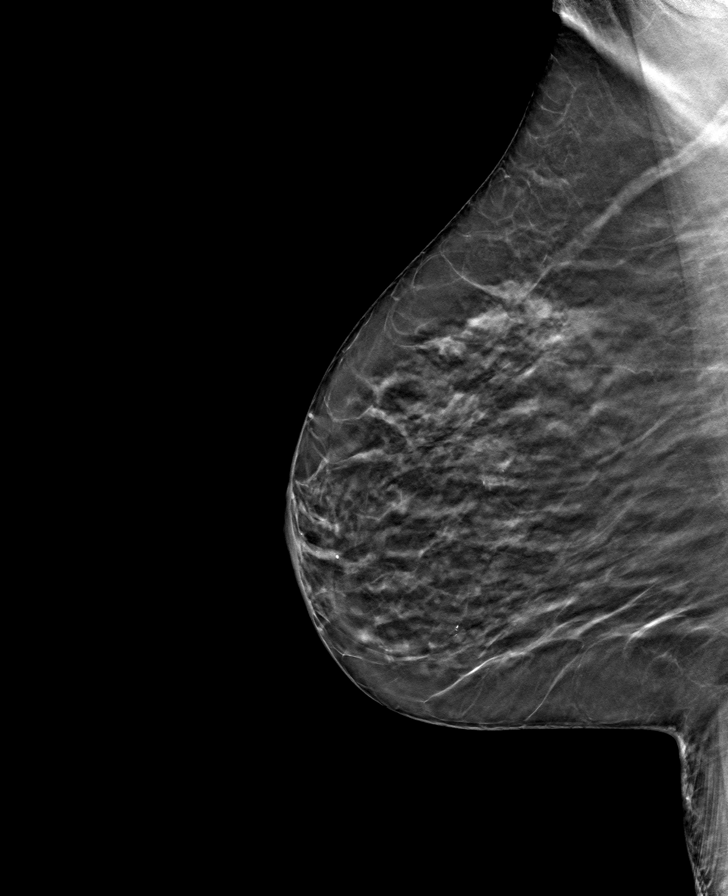

[R CC tomo · tomo slice 27/54.0]
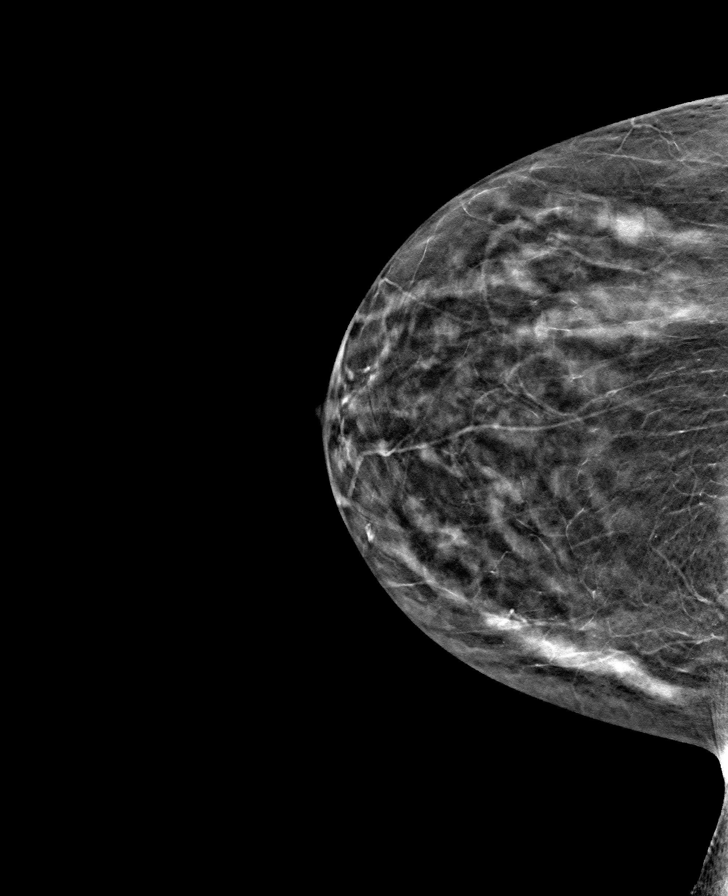

[L MLO tomo · tomo slice 28/55.0]
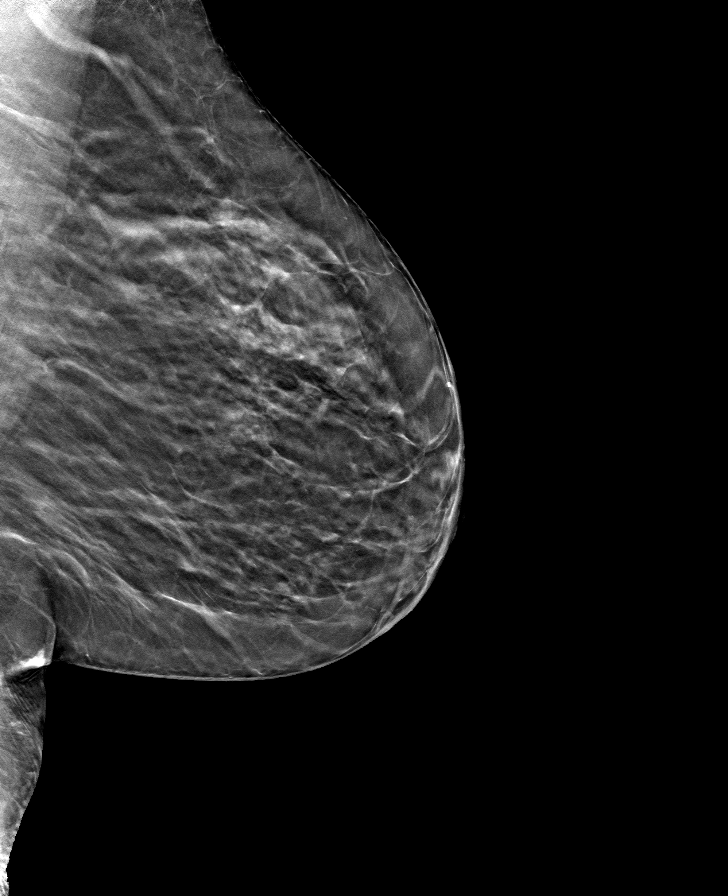

[L CC tomo · tomo slice 25/49.0]
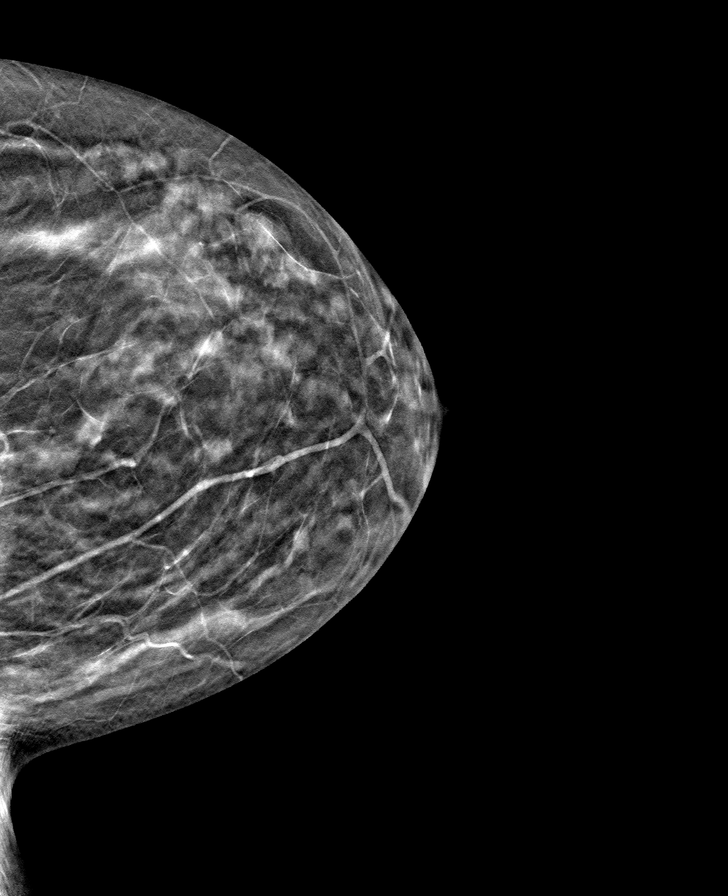

[8 of 24 positions shown; findings below may reference images not displayed]

ACR Breast Density Category b: There are scattered areas of
fibroglandular density.
FINDINGS: There are no findings suspicious for malignancy. Images were
processed with CAD.
IMPRESSION: No mammographic evidence of malignancy. A result letter of this
screening mammogram will be mailed directly to the patient.

RECOMMENDATION:
Screening mammogram in one year. (Code:[id number])

BI-RADS CATEGORY  1: Negative.

## 2020-10-12 DIAGNOSIS — F17200 Nicotine dependence, unspecified, uncomplicated: Secondary | ICD-10-CM | POA: Diagnosis not present

## 2020-10-12 DIAGNOSIS — E1122 Type 2 diabetes mellitus with diabetic chronic kidney disease: Secondary | ICD-10-CM | POA: Diagnosis not present

## 2020-10-12 DIAGNOSIS — N184 Chronic kidney disease, stage 4 (severe): Secondary | ICD-10-CM | POA: Diagnosis not present

## 2020-10-12 DIAGNOSIS — I129 Hypertensive chronic kidney disease with stage 1 through stage 4 chronic kidney disease, or unspecified chronic kidney disease: Secondary | ICD-10-CM | POA: Diagnosis not present

## 2020-12-28 ENCOUNTER — Encounter: Payer: Self-pay | Admitting: Family Medicine

## 2020-12-28 ENCOUNTER — Ambulatory Visit: Payer: Medicare HMO | Attending: Family Medicine | Admitting: Family Medicine

## 2020-12-28 ENCOUNTER — Other Ambulatory Visit: Payer: Self-pay

## 2020-12-28 VITALS — BP 148/70 | HR 75 | Ht 61.0 in | Wt 139.0 lb

## 2020-12-28 DIAGNOSIS — E785 Hyperlipidemia, unspecified: Secondary | ICD-10-CM | POA: Diagnosis not present

## 2020-12-28 DIAGNOSIS — N184 Chronic kidney disease, stage 4 (severe): Secondary | ICD-10-CM | POA: Diagnosis not present

## 2020-12-28 DIAGNOSIS — I10 Essential (primary) hypertension: Secondary | ICD-10-CM | POA: Diagnosis not present

## 2020-12-28 DIAGNOSIS — E1122 Type 2 diabetes mellitus with diabetic chronic kidney disease: Secondary | ICD-10-CM

## 2020-12-28 DIAGNOSIS — Z720 Tobacco use: Secondary | ICD-10-CM | POA: Diagnosis not present

## 2020-12-28 DIAGNOSIS — E1121 Type 2 diabetes mellitus with diabetic nephropathy: Secondary | ICD-10-CM

## 2020-12-28 LAB — POCT GLYCOSYLATED HEMOGLOBIN (HGB A1C): HbA1c, POC (controlled diabetic range): 5.8 % (ref 0.0–7.0)

## 2020-12-28 LAB — GLUCOSE, POCT (MANUAL RESULT ENTRY): POC Glucose: 122 mg/dl — AB (ref 70–99)

## 2020-12-28 MED ORDER — SIMVASTATIN 10 MG PO TABS: 10.0000 mg | ORAL_TABLET | Freq: Every day | ORAL | 1 refills | Status: DC

## 2020-12-28 MED ORDER — HYDROCHLOROTHIAZIDE 25 MG PO TABS: 25.0000 mg | ORAL_TABLET | Freq: Every day | ORAL | 1 refills | Status: DC

## 2020-12-28 MED ORDER — TRULICITY 0.75 MG/0.5ML ~~LOC~~ SOAJ: 0.7500 mg | SUBCUTANEOUS | 6 refills | Status: DC

## 2020-12-28 MED ORDER — ISOSORBIDE MONONITRATE ER 60 MG PO TB24: 60.0000 mg | ORAL_TABLET | Freq: Every day | ORAL | 1 refills | Status: DC

## 2020-12-28 MED ORDER — GLIPIZIDE 10 MG PO TABS: 10.0000 mg | ORAL_TABLET | Freq: Two times a day (BID) | ORAL | 1 refills | Status: DC

## 2020-12-28 MED ORDER — AMLODIPINE BESYLATE 10 MG PO TABS: 10.0000 mg | ORAL_TABLET | Freq: Every day | ORAL | 1 refills | Status: DC

## 2020-12-28 NOTE — Progress Notes (Signed)
Needs refill on medications.

## 2020-12-28 NOTE — Patient Instructions (Signed)
Hypoglycemia Hypoglycemia is when the sugar (glucose) level in your blood is too low. Low blood sugar can happen to people who have diabetes and people who do not have diabetes. Low blood sugar can happen quickly, and it can be an emergency. What are the causes? This condition happens most often in people who have diabetes and may be caused by:  Diabetes medicine.  Not eating enough, or not eating often enough.  Doing more physical activity.  Drinking alcohol on an empty stomach. If you do not have diabetes, hypoglycemia may be caused by:  A tumor in the pancreas.  Not eating enough, or not eating for long periods at a time (fasting).  A very bad infection or illness.  Problems after having weight loss (bariatric) surgery.  Kidney failure or liver failure.  Certain medicines. What increases the risk? This condition is more likely to develop in people who:  Have diabetes and take medicines to lower their blood sugar.  Abuse alcohol.  Have a very bad illness. What are the signs or symptoms? Symptoms depend on whether your low blood sugar is mild, moderate, or very low. Mild  Hunger.  Feeling worried or nervous (anxious).  Sweating and feeling clammy.  Feeling dizzy or light-headed.  Being sleepy or having trouble sleeping.  Feeling like you may vomit (nauseous).  A fast heartbeat.  A headache.  Blurry vision.  Being irritable or grouchy.  Tingling or loss of feeling (numbness) around your mouth, lips, or tongue.  Trouble with moving (coordination). Moderate  Confusion and poor judgment.  Behavior changes.  Weakness.  Uneven heartbeats. Very low Very low blood sugar (severe hypoglycemia) is a medical emergency. It can cause:  Fainting.  Jerky movements that you cannot control (seizure).  Loss of consciousness (coma).  Death. How is this treated? Treating low blood sugar Low blood sugar is often treated by eating or drinking something  sugary right away. The snack should contain 15 grams of a fast-acting carb (carbohydrate). Options include:  4 oz (120 mL) of fruit juice.  4-6 oz (120-150 mL) of regular soda (not diet soda).  8 oz (240 mL) of low-fat milk.  Several pieces of hard candy. Check food labels to find out how many to eat for 15 grams.  1 Tbsp (15 mL) of sugar or honey. Treating low blood sugar if you have diabetes If you can think clearly and swallow safely, follow the 15:15 rule:  Take 15 grams of a fast-acting carb. Talk with your doctor about how much you should take.  Always keep a source of fast-acting carb with you, such as: ? Sugar tablets (glucose pills). Take 4 pills. ? Several pieces of hard candy. Check food labels to see how many pieces to eat for 15 grams. ? 4 oz (120 mL) of fruit juice. ? 4-6 oz (120-150 mL) of regular (not diet) soda. ? 1 Tbsp (15 mL) of honey or sugar.  Check your blood sugar 15 minutes after you take the carb.  If your blood sugar is still at or below 70 mg/dL (3.9 mmol/L), take 15 grams of a carb again.  If your blood sugar does not go above 70 mg/dL (3.9 mmol/L) after 3 tries, get help right away.  After your blood sugar goes back to normal, eat a meal or a snack within 1 hour.   Treating very low blood sugar If your blood sugar is at or below 54 mg/dL (3 mmol/L), you have very low blood sugar, or severe   hypoglycemia. This is an emergency. Get medical help right away. If you have very low blood sugar and you cannot eat or drink, you will need to be given a hormone called glucagon. A family member or friend should learn how to check your blood sugar and how to give you glucagon. Ask your doctor if you need to have an emergency glucagon kit at home. Very low blood sugar may also need to be treated in a hospital. Follow these instructions at home: General instructions  Take over-the-counter and prescription medicines only as told by your doctor.  Stay aware of your  blood sugar as told by your doctor.  If you drink alcohol: ? Limit how much you use to:  0-1 drink a day for nonpregnant women.  0-2 drinks a day for men. ? Be aware of how much alcohol is in your drink. In the [place] one drink equals one 12 oz bottle of beer (355 mL), one 5 oz glass of wine (148 mL), or one 1 oz glass of hard liquor (44 mL).  Keep all follow-up visits as told by your doctor. This is important. If you have diabetes:  Always have a rapid-acting carb (15 grams) option with you to treat low blood sugar.  Follow your diabetes care plan as told by your doctor. Make sure you: ? Know the symptoms of low blood sugar. ? Check your blood sugar as often as told by your doctor. Always check it before and after exercise. ? Always check your blood sugar before you drive. ? Take your medicines as told. ? Follow your meal plan. ? Eat on time. Do not skip meals.  Share your diabetes care plan with: ? Your work or school. ? People you live with.  Carry a card or wear jewelry that says you have diabetes.   Contact a doctor if:  You have trouble keeping your blood sugar in your target range.  You have low blood sugar often. Get help right away if:  You still have symptoms after you eat or drink something that contains 15 grams of fast-acting carb and you cannot get your blood sugar above 70 mg/dL by following the 15:15 rule.  Your blood sugar is at or below 54 mg/dL (3 mmol/L).  You have a seizure.  You faint. These symptoms may be an emergency. Do not wait to see if the symptoms will go away. Get medical help right away. Call your local emergency services (911 in the [place] Do not drive yourself to the hospital. Summary  Hypoglycemia happens when the level of sugar (glucose) in your blood is too low.  Low blood sugar can happen to people who have diabetes and people who do not have diabetes. Low blood sugar can happen quickly, and it can be an emergency.  Make sure you  know the symptoms of low blood sugar and know how to treat it.  Always keep a source of sugar (fast-acting carb) with you to treat low blood sugar. This information is not intended to replace advice given to you by your health care provider. Make sure you discuss any questions you have with your health care provider. Document Revised: 10/15/2019 Document Reviewed: 10/15/2019 Elsevier Patient Education  2021 [employer].

## 2020-12-28 NOTE — Progress Notes (Signed)
Subjective:  Patient ID: Andrea Brown, female    DOB: July 22, 1951  Age: 70 y.o. MRN: [id number]  CC: Diabetes   HPI Andrea Brown  is 70 yo female with a medical history of Diabetes Mellitus Type 2 (A1c 5.8), Hypertension, Hyperlipidemia, CKD stage 4 and tobacco use that comes into the office today for 6 month follow up.  She sees her Nephrologist once/year and received erythropoietin replacement therapy late last year.  Diabetes is controlled with A1c of 5.8 which has trended down from 7.6 previously. She denied hypoglycemic symptoms, numbness in extremities or blurry vision. Compliant with her antihypertensive and statin and she denies adverse effects from her medications. She exercises regularly by means of a stationary bike at home. She has no additional concerns today.  Past Medical History:  Diagnosis Date  . 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 Diabetes mellitus without complication ([place])   . Hyperlipidemia   . Hypertension   . Papilloma of right breast   . Smoker     Past Surgical History:  Procedure Laterality Date  . BREAST LUMPECTOMY WITH RADIOACTIVE SEED LOCALIZATION Right 02/23/2020   Procedure: RIGHT BREAST LUMPECTOMY X 3 WITH RADIOACTIVE SEED LOCALIZATION;  Surgeon: Jovita Kussmaul, MD;  Location: [facility];  Service: General;  Laterality: Right;  . TOTAL ABDOMINAL HYSTERECTOMY  2000    Family History  Problem Relation Age of Onset  . Diabetes Mother   . Hypertension Mother   . Colon cancer Neg Hx   . Esophageal cancer Neg Hx   . Pancreatic cancer Neg Hx   . Rectal cancer Neg Hx   . Stomach cancer Neg Hx     No Known Allergies  Outpatient Medications Prior to Visit  Medication Sig Dispense Refill  . aspirin 81 MG tablet Take 1 tablet (81 mg total) daily by mouth. 30 tablet 5  . Blood Glucose Monitoring Suppl (ACCU-CHEK GUIDE ME) w/Device KIT 1 each by Does not apply route 3 (three) times daily before meals. 1 kit 0  . glucose blood  (ACCU-CHEK GUIDE) test strip Use as instructed 1 to 2 times per day. E11.21 50 each 11  . Misc. Devices MISC Blood pressure monitor; diagnosis-hypertension 1 each 0  . amLODipine (NORVASC) 10 MG tablet Take 1 tablet (10 mg total) by mouth daily. 90 tablet 1  . Dulaglutide (TRULICITY) 1.60 VP/7.1GG SOPN Inject 0.5 mLs (0.75 mg total) into the skin once a week. 2 mL 6  . glipiZIDE (GLUCOTROL) 10 MG tablet Take 1 tablet (10 mg total) by mouth 2 (two) times daily before a meal. 180 tablet 1  . hydrochlorothiazide (HYDRODIURIL) 25 MG tablet Take 1 tablet (25 mg total) by mouth daily. 90 tablet 1  . isosorbide mononitrate (IMDUR) 60 MG 24 hr tablet Take 1 tablet (60 mg total) by mouth daily. 90 tablet 1  . simvastatin (ZOCOR) 10 MG tablet Take 1 tablet (10 mg total) by mouth daily at 6 PM. 90 tablet 1  . oxyCODONE (OXY IR/ROXICODONE) 5 MG immediate release tablet Take 1-2 tablets (5-10 mg total) by mouth every 6 (six) hours as needed for moderate pain, severe pain or breakthrough pain. (Patient not taking: No sig reported) 10 tablet 0   No facility-administered medications prior to visit.     ROS Review of Systems  Constitutional: Negative for activity change, appetite change and fatigue.  HENT: Negative for congestion, sinus pressure and sore throat.   Eyes: Negative for visual disturbance.  Respiratory: Negative for cough, chest tightness, shortness of breath and wheezing.   Cardiovascular: Negative for chest pain and palpitations.  Gastrointestinal: Negative for abdominal distention, abdominal pain and constipation.  Endocrine: Negative for polydipsia.  Genitourinary: Negative for dysuria and frequency.  Musculoskeletal: Negative for arthralgias and back pain.  Skin: Negative for rash.  Neurological: Negative for tremors, light-headedness and numbness.  Hematological: Does not bruise/bleed easily.  Psychiatric/Behavioral: Negative for agitation and behavioral problems.    Objective:  BP  (!) 148/70   Pulse 75   Ht 5' 1"  (1.549 m)   Wt 139 lb (63 kg)   SpO2 99%   BMI 26.26 kg/m   BP/Weight 12/28/2020 08/05/2020 0/31/2811  Systolic BP 886 773 736  Diastolic BP 70 61 69  Wt. (Lbs) 139 - -  BMI 26.26 - -      Physical Exam Constitutional:      Appearance: She is well-developed.  Neck:     Vascular: No JVD.  Cardiovascular:     Rate and Rhythm: Normal rate.     Heart sounds: Normal heart sounds. No murmur heard.   Pulmonary:     Effort: Pulmonary effort is normal.     Breath sounds: Normal breath sounds. No wheezing or rales.  Chest:     Chest wall: No tenderness.  Abdominal:     General: Bowel sounds are normal. There is no distension.     Palpations: Abdomen is soft. There is no mass.     Tenderness: There is no abdominal tenderness.  Musculoskeletal:        General: Normal range of motion.     Right lower leg: No edema.     Left lower leg: No edema.  Neurological:     Mental Status: She is alert and oriented to person, place, and time.  Psychiatric:        Mood and Affect: Mood normal.     CMP Latest Ref Rng & Units 03/23/2020 02/23/2020 09/11/2019  Glucose 65 - 99 mg/dL 390(H) 385(H) 292(H)  BUN 8 - 27 mg/dL 39(H) 35(H) 41(H)  Creatinine 0.57 - 1.00 mg/dL 2.28(H) 2.30(H) 2.05(H)  Sodium 134 - 144 mmol/L 140 137 139  Potassium 3.5 - 5.2 mmol/L 4.1 4.5 4.3  Chloride 96 - 106 mmol/L 98 98 100  CO2 20 - 29 mmol/L 22 27 26   Calcium 8.7 - 10.3 mg/dL 10.2 9.7 9.6  Total Protein 6.0 - 8.5 g/dL 7.3 - 6.9  Total Bilirubin 0.0 - 1.2 mg/dL 0.3 - 0.3  Alkaline Phos 39 - 117 IU/L 82 - 66  AST 0 - 40 IU/L 18 - 15  ALT 0 - 32 IU/L 14 - 13    Lipid Panel     Component Value Date/Time   CHOL 173 03/23/2020 1058   TRIG 81 03/23/2020 1058   HDL 68 03/23/2020 1058   CHOLHDL 2.5 03/23/2020 1058   CHOLHDL 3.7 10/05/2016 1048   VLDL 19 10/05/2016 1048   LDLCALC 90 03/23/2020 1058   LDLDIRECT 40 09/09/2009 2145    CBC    Component Value Date/Time   WBC  4.3 10/17/2017 0933   WBC 5.5 10/05/2016 1048   RBC 3.31 (L) 10/17/2017 0933   RBC 3.97 10/05/2016 1048   HGB 9.4 (L) 10/17/2017 0933   HCT 28.8 (L) 10/17/2017 0933   PLT 191 10/17/2017 0933   MCV 87 10/17/2017 0933   MCH 28.4 10/17/2017 0933   MCH 28.2 10/05/2016 1048   MCHC 32.6 10/17/2017 0933  MCHC 32.7 10/05/2016 1048   RDW 14.8 10/17/2017 0933   LYMPHSABS 1.3 10/17/2017 0933   MONOABS 275 10/05/2016 1048   EOSABS 0.1 10/17/2017 0933   BASOSABS 0.0 10/17/2017 0933    Lab Results  Component Value Date   HGBA1C 5.8 12/28/2020    Assessment & Plan:  1. Type 2 diabetes mellitus with diabetic nephropathy, without long-term current use of insulin (HCC) Controlled with A1c of 5.8 I have discussed with her symptoms of hypoglycemia and management and the plan would be to decrease glipizide dose if this occurs - POCT glucose (manual entry) - POCT glycosylated hemoglobin (Hb A1C) - CMP14+EGFR - Lipid panel - Dulaglutide (TRULICITY) 8.41 YS/0.6TK SOPN; Inject 0.75 mg into the skin once a week.  Dispense: 2 mL; Refill: 6 - glipiZIDE (GLUCOTROL) 10 MG tablet; Take 1 tablet (10 mg total) by mouth 2 (two) times daily before a meal.  Dispense: 180 tablet; Refill: 1  2. Essential hypertension Slightly elevated blood pressure Blood pressure was normal at her next visit hence I will make no regimen changes today Counseled on blood pressure goal of less than 130/80, low-sodium, DASH diet, medication compliance, 150 minutes of moderate intensity exercise per week. Discussed medication compliance, adverse effects. - amLODipine (NORVASC) 10 MG tablet; Take 1 tablet (10 mg total) by mouth daily.  Dispense: 90 tablet; Refill: 1 - hydrochlorothiazide (HYDRODIURIL) 25 MG tablet; Take 1 tablet (25 mg total) by mouth daily.  Dispense: 90 tablet; Refill: 1 - isosorbide mononitrate (IMDUR) 60 MG 24 hr tablet; Take 1 tablet (60 mg total) by mouth daily.  Dispense: 90 tablet; Refill: 1  3.  Dyslipidemia Controlled - simvastatin (ZOCOR) 10 MG tablet; Take 1 tablet (10 mg total) by mouth daily at 6 PM.  Dispense: 90 tablet; Refill: 1  4. CKD stage 4 due to type 2 diabetes mellitus ([place]) A combination of hypertensive and diabetic retinopathy Avoid nephrotoxins Follow-up with nephrologist  5. Tobacco abuse Spent 3 minutes counseling on smoking cessation but she is not ready to quit at this time     Meds ordered this encounter  Medications  . amLODipine (NORVASC) 10 MG tablet    Sig: Take 1 tablet (10 mg total) by mouth daily.    Dispense:  90 tablet    Refill:  1  . Dulaglutide (TRULICITY) 1.60 FU/9.3AT SOPN    Sig: Inject 0.75 mg into the skin once a week.    Dispense:  2 mL    Refill:  6  . glipiZIDE (GLUCOTROL) 10 MG tablet    Sig: Take 1 tablet (10 mg total) by mouth 2 (two) times daily before a meal.    Dispense:  180 tablet    Refill:  1    Dose increase  . hydrochlorothiazide (HYDRODIURIL) 25 MG tablet    Sig: Take 1 tablet (25 mg total) by mouth daily.    Dispense:  90 tablet    Refill:  1  . isosorbide mononitrate (IMDUR) 60 MG 24 hr tablet    Sig: Take 1 tablet (60 mg total) by mouth daily.    Dispense:  90 tablet    Refill:  1  . simvastatin (ZOCOR) 10 MG tablet    Sig: Take 1 tablet (10 mg total) by mouth daily at 6 PM.    Dispense:  90 tablet    Refill:  1    Follow-up: Return in about 6 months (around 06/27/2021) for Chronic disease management.       Zana Biancardi,  MD, FAAFP. [facility] and [place], [place]   12/28/2020, 12:55 PM

## 2020-12-29 LAB — CMP14+EGFR
ALT: 16 IU/L (ref 0–32)
AST: 20 IU/L (ref 0–40)
Albumin/Globulin Ratio: 1.7 (ref 1.2–2.2)
Albumin: 4.4 g/dL (ref 3.7–4.7)
Alkaline Phosphatase: 67 IU/L (ref 44–121)
BUN/Creatinine Ratio: 22 (ref 12–28)
BUN: 42 mg/dL — ABNORMAL HIGH (ref 8–27)
Bilirubin Total: 0.3 mg/dL (ref 0.0–1.2)
CO2: 22 mmol/L (ref 20–29)
Calcium: 9.6 mg/dL (ref 8.7–10.3)
Chloride: 105 mmol/L (ref 96–106)
Creatinine, Ser: 1.88 mg/dL — ABNORMAL HIGH (ref 0.57–1.00)
GFR calc Af Amer: 31 mL/min/{1.73_m2} — ABNORMAL LOW (ref >59)
GFR calc non Af Amer: 26 mL/min/{1.73_m2} — ABNORMAL LOW (ref >59)
Globulin, Total: 2.6 g/dL (ref 1.5–4.5)
Glucose: 118 mg/dL — ABNORMAL HIGH (ref 65–99)
Potassium: 4.2 mmol/L (ref 3.5–5.2)
Sodium: 143 mmol/L (ref 134–144)
Total Protein: 7 g/dL (ref 6.0–8.5)

## 2020-12-29 LAB — LIPID PANEL
Chol/HDL Ratio: 2 ratio (ref 0.0–4.4)
Cholesterol, Total: 131 mg/dL (ref 100–199)
HDL: 65 mg/dL (ref >39)
LDL Chol Calc (NIH): 56 mg/dL (ref 0–99)
Triglycerides: 41 mg/dL (ref 0–149)
VLDL Cholesterol Cal: 10 mg/dL (ref 5–40)

## 2021-02-22 ENCOUNTER — Other Ambulatory Visit: Payer: Self-pay | Admitting: Family Medicine

## 2021-02-22 DIAGNOSIS — Z9889 Other specified postprocedural states: Secondary | ICD-10-CM

## 2021-03-16 ENCOUNTER — Ambulatory Visit
Admission: RE | Admit: 2021-03-16 | Discharge: 2021-03-16 | Disposition: A | Discharge status: Home / Self Care | Payer: Medicare HMO | Source: Ambulatory Visit | Attending: Family Medicine | Admitting: Family Medicine

## 2021-03-16 ENCOUNTER — Other Ambulatory Visit: Payer: Self-pay

## 2021-03-16 DIAGNOSIS — R922 Inconclusive mammogram: Secondary | ICD-10-CM | POA: Diagnosis not present

## 2021-03-16 DIAGNOSIS — Z9889 Other specified postprocedural states: Secondary | ICD-10-CM

## 2021-03-21 ENCOUNTER — Telehealth: Payer: Self-pay

## 2021-03-21 NOTE — Telephone Encounter (Signed)
Patient name and DOB has been verified Patient was informed of lab results. Patient had no questions.  

## 2021-03-21 NOTE — Telephone Encounter (Signed)
-----   Message from Charlott Rakes, MD sent at 03/21/2021  4:04 PM EDT ----- Please inform her that mammogram does not reveal evidence of malignancy.

## 2021-03-24 DIAGNOSIS — E119 Type 2 diabetes mellitus without complications: Secondary | ICD-10-CM | POA: Diagnosis not present

## 2021-04-19 DIAGNOSIS — E1122 Type 2 diabetes mellitus with diabetic chronic kidney disease: Secondary | ICD-10-CM | POA: Diagnosis not present

## 2021-04-19 DIAGNOSIS — I129 Hypertensive chronic kidney disease with stage 1 through stage 4 chronic kidney disease, or unspecified chronic kidney disease: Secondary | ICD-10-CM | POA: Diagnosis not present

## 2021-04-19 DIAGNOSIS — N184 Chronic kidney disease, stage 4 (severe): Secondary | ICD-10-CM | POA: Diagnosis not present

## 2021-04-19 DIAGNOSIS — F17200 Nicotine dependence, unspecified, uncomplicated: Secondary | ICD-10-CM | POA: Diagnosis not present

## 2021-04-20 DIAGNOSIS — N390 Urinary tract infection, site not specified: Secondary | ICD-10-CM | POA: Diagnosis not present

## 2021-05-17 DIAGNOSIS — N390 Urinary tract infection, site not specified: Secondary | ICD-10-CM | POA: Diagnosis not present

## 2021-06-13 DIAGNOSIS — N390 Urinary tract infection, site not specified: Secondary | ICD-10-CM | POA: Diagnosis not present

## 2021-06-29 ENCOUNTER — Other Ambulatory Visit: Payer: Self-pay | Admitting: Family Medicine

## 2021-06-29 DIAGNOSIS — E785 Hyperlipidemia, unspecified: Secondary | ICD-10-CM

## 2021-06-29 DIAGNOSIS — I10 Essential (primary) hypertension: Secondary | ICD-10-CM

## 2021-06-29 DIAGNOSIS — E1121 Type 2 diabetes mellitus with diabetic nephropathy: Secondary | ICD-10-CM

## 2021-06-29 NOTE — Telephone Encounter (Signed)
Notes to clinic:  Patient has appt on 07/13/2021 Review for refill    Requested Prescriptions  Pending Prescriptions Disp Refills   hydrochlorothiazide (HYDRODIURIL) 25 MG tablet [Pharmacy Med Name: HYDROCHLOROTHIAZIDE 25 MG Tablet] 90 tablet 1    Sig: TAKE 1 TABLET EVERY DAY      Cardiovascular: Diuretics - Thiazide Failed - 06/29/2021  9:20 AM      Failed - Cr in normal range and within 360 days    Creat  Date Value Ref Range Status  10/05/2016 2.31 (H) 0.50 - 0.99 mg/dL Final    Comment:      For patients > or = 72 years of age: The upper reference limit for Creatinine is approximately 13% higher for people identified as African-American.      Creatinine, Ser  Date Value Ref Range Status  12/28/2020 1.88 (H) 0.57 - 1.00 mg/dL Final   Creatinine, Urine  Date Value Ref Range Status  10/05/2016 116 20 - 320 mg/dL Final          Failed - Last BP in normal range    BP Readings from Last 1 Encounters:  12/28/20 (!) 148/70          Failed - Valid encounter within last 6 months    Recent Outpatient Visits           6 months ago Type 2 diabetes mellitus with diabetic nephropathy, without long-term current use of insulin ([place])   [place], Charlane Ferretti, MD   1 year ago Type 2 diabetes mellitus with diabetic nephropathy, without long-term current use of insulin ([place])   [place], Charlane Ferretti, MD   1 year ago Type 2 diabetes mellitus with diabetic nephropathy, without long-term current use of insulin ([place])   [place], Jarome Matin, RPH-CPP   1 year ago Tobacco abuse   [place], MD   1 year ago Encounter for medication review and counseling   [place], RPH-CPP       Future Appointments             In 2 weeks Argentina Donovan, PA-C [place] in normal range and within 360 days    Calcium  Date Value Ref Range Status  12/28/2020 9.6 8.7 - 10.3 mg/dL Final          Passed - K in normal range and within 360 days    Potassium  Date Value Ref Range Status  12/28/2020 4.2 3.5 - 5.2 mmol/L Final          Passed - Na in normal range and within 360 days    Sodium  Date Value Ref Range Status  12/28/2020 143 134 - 144 mmol/L Final            isosorbide mononitrate (IMDUR) 60 MG 24 hr tablet [Pharmacy Med Name: ISOSORBIDE MONONITRATE ER 60 MG Tablet Extended Release 24 Hour] 90 tablet 1    Sig: TAKE 1 TABLET EVERY DAY      Cardiovascular:  Nitrates Failed - 06/29/2021  9:20 AM      Failed - Last BP in normal range    BP Readings from Last 1 Encounters:  12/28/20 (!) 148/70  Passed - Last Heart Rate in normal range    Pulse Readings from Last 1 Encounters:  12/28/20 75          Passed - Valid encounter within last 12 months    Recent Outpatient Visits           6 months ago Type 2 diabetes mellitus with diabetic nephropathy, without long-term current use of insulin ([place])   [place], Charlane Ferretti, MD   1 year ago Type 2 diabetes mellitus with diabetic nephropathy, without long-term current use of insulin ([place])   [place], Charlane Ferretti, MD   1 year ago Type 2 diabetes mellitus with diabetic nephropathy, without long-term current use of insulin ([place])   [place], Jarome Matin, RPH-CPP   1 year ago Tobacco abuse   [place], MD   1 year ago Encounter for medication review and counseling   [place], RPH-CPP       Future Appointments             In 2 weeks Argentina Donovan, PA-C [place]                simvastatin (ZOCOR) 10 MG tablet [employer] Med Name: SIMVASTATIN 10 MG Tablet] 90 tablet 1    Sig: TAKE 1 TABLET EVERY DAY AT 6 PM.      Cardiovascular:  Antilipid - Statins Passed - 06/29/2021  9:20 AM      Passed - Total Cholesterol in normal range and within 360 days    Cholesterol, Total  Date Value Ref Range Status  12/28/2020 131 100 - 199 mg/dL Final          Passed - LDL in normal range and within 360 days    LDL Chol Calc (NIH)  Date Value Ref Range Status  12/28/2020 56 0 - 99 mg/dL Final   Direct LDL  Date Value Ref Range Status  09/09/2009 40 mg/dL Final    Comment:    See lab report for associated comment(s)          Passed - HDL in normal range and within 360 days    HDL  Date Value Ref Range Status  12/28/2020 65 >39 mg/dL Final          Passed - Triglycerides in normal range and within 360 days    Triglycerides  Date Value Ref Range Status  12/28/2020 41 0 - 149 mg/dL Final          Passed - Patient is not pregnant      Passed - Valid encounter within last 12 months    Recent Outpatient Visits           6 months ago Type 2 diabetes mellitus with diabetic nephropathy, without long-term current use of insulin ([place])   [place], [place], MD   1 year ago Type 2 diabetes mellitus with diabetic nephropathy, without long-term current use of insulin ([place])   [place], [place], MD   1 year ago Type 2 diabetes mellitus with diabetic nephropathy, without long-term current use of insulin [facility])   [place], Jarome Matin, RPH-CPP   1 year ago Tobacco abuse   [place]  Charlott Rakes, MD   1 year ago Encounter for medication review and counseling   [place], RPH-CPP       Future Appointments             In 2 weeks Thereasa Solo Dionne Bucy, PA-C [place] 6.44 IH/4.7QQ SOPN [Pharmacy Med Name: TRULICITY 5.95 GL/8.7FI Solution Pen-injector]      Sig: Inject 0.75 mg into the skin once a week.      Endocrinology:  Diabetes - GLP-1 Receptor Agonists Failed - 06/29/2021  9:20 AM      Failed - HBA1C is between 0 and 7.9 and within 180 days    HbA1c, POC (controlled diabetic range)  Date Value Ref Range Status  12/28/2020 5.8 0.0 - 7.0 % Final          Failed - Valid encounter within last 6 months    Recent Outpatient Visits           6 months ago Type 2 diabetes mellitus with diabetic nephropathy, without long-term current use of insulin ([place])   [place], [place], MD   1 year ago Type 2 diabetes mellitus with diabetic nephropathy, without long-term current use of insulin ([place])   [place], [place], MD   1 year ago Type 2 diabetes mellitus with diabetic nephropathy, without long-term current use of insulin ([place])   [place], Jarome Matin, RPH-CPP   1 year ago Tobacco abuse   [place] Charlott Rakes, MD   1 year ago Encounter for medication review and counseling   [place], RPH-CPP       Future Appointments             In 2 weeks Argentina Donovan, PA-C [place]               amLODipine (NORVASC) 10 MG tablet [Pharmacy Med Name: AMLODIPINE BESYLATE 10 MG Tablet] 90 tablet 1    Sig: TAKE 1 TABLET EVERY DAY      Cardiovascular:  Calcium Channel Blockers Failed - 06/29/2021  9:20 AM      Failed - Last BP in normal range    BP Readings from Last 1 Encounters:  12/28/20 (!) 148/70          Failed - Valid encounter within last 6 months    Recent Outpatient Visits           6 months ago Type 2 diabetes mellitus with diabetic  nephropathy, without long-term current use of insulin ([place])   [place], [place], MD   1 year ago Type 2 diabetes mellitus with diabetic nephropathy, without long-term current use of insulin ([place])   [place], [place], MD   1 year ago Type 2 diabetes mellitus with diabetic nephropathy, without long-term current use of insulin [facility])   [place], Jarome Matin, RPH-CPP   1 year ago Tobacco abuse   [place], MD   1 year ago Encounter for medication review and counseling   [place],  Jarome Matin, RPH-CPP       Future Appointments             In 2 weeks Thereasa Solo [place]

## 2021-07-05 ENCOUNTER — Other Ambulatory Visit: Payer: Self-pay | Admitting: Family Medicine

## 2021-07-05 DIAGNOSIS — E1121 Type 2 diabetes mellitus with diabetic nephropathy: Secondary | ICD-10-CM

## 2021-07-05 MED ORDER — GLIPIZIDE 10 MG PO TABS: 10.0000 mg | ORAL_TABLET | Freq: Two times a day (BID) | ORAL | 0 refills | Status: DC

## 2021-07-05 NOTE — Telephone Encounter (Signed)
Requested Prescriptions  Pending Prescriptions Disp Refills  . glipiZIDE (GLUCOTROL) 10 MG tablet 180 tablet 0    Sig: Take 1 tablet (10 mg total) by mouth 2 (two) times daily before a meal.     Endocrinology:  Diabetes - Sulfonylureas Failed - 07/05/2021  6:32 PM      Failed - HBA1C is between 0 and 7.9 and within 180 days    HbA1c, POC (controlled diabetic range)  Date Value Ref Range Status  12/28/2020 5.8 0.0 - 7.0 % Final         Failed - Valid encounter within last 6 months    Recent Outpatient Visits          6 months ago Type 2 diabetes mellitus with diabetic nephropathy, without long-term current use of insulin ([place])   Slater, [place], MD   1 year ago Type 2 diabetes mellitus with diabetic nephropathy, without long-term current use of insulin ([place])   [place], [place], MD   1 year ago Type 2 diabetes mellitus with diabetic nephropathy, without long-term current use of insulin ([place])   [place], Jarome Matin, RPH-CPP   1 year ago Tobacco abuse   [place], MD   1 year ago Encounter for medication review and counseling   [place], Jarome Matin, RPH-CPP      Future Appointments            In 1 week Thereasa Solo, Dionne Bucy, PA-C [place]

## 2021-07-05 NOTE — Telephone Encounter (Signed)
Medication Refill - Medication: glipiZIDE (GLUCOTROL) 10 MG tablet   Has the patient contacted their pharmacy? yes (Agent: If no, request that the patient contact the pharmacy for the refill.) (Agent: If yes, when and what did the pharmacy advise?)[facility] called  Preferred Pharmacy (with phone number or street name): [facility] [address], [place], [place] [address] [phone]  Agent: Please be advised that RX refills may take up to 3 business days. We ask that you follow-up with your pharmacy.

## 2021-07-06 ENCOUNTER — Other Ambulatory Visit: Payer: Self-pay

## 2021-07-06 DIAGNOSIS — E1121 Type 2 diabetes mellitus with diabetic nephropathy: Secondary | ICD-10-CM

## 2021-07-06 MED ORDER — GLIPIZIDE 10 MG PO TABS: 10.0000 mg | ORAL_TABLET | Freq: Two times a day (BID) | ORAL | 0 refills | Status: DC

## 2021-07-06 NOTE — Telephone Encounter (Signed)
Filled 07/05/21 # 180.

## 2021-07-06 NOTE — Progress Notes (Signed)
Glipizide refill would not go through electronically. Verbal called to [place].

## 2021-07-13 ENCOUNTER — Other Ambulatory Visit: Payer: Self-pay

## 2021-07-13 ENCOUNTER — Encounter: Payer: Self-pay | Admitting: Physician Assistant

## 2021-07-13 ENCOUNTER — Ambulatory Visit: Payer: Medicare HMO | Attending: Physician Assistant | Admitting: Physician Assistant

## 2021-07-13 VITALS — BP 138/68 | HR 72 | Ht 61.0 in | Wt 145.0 lb

## 2021-07-13 DIAGNOSIS — E1121 Type 2 diabetes mellitus with diabetic nephropathy: Secondary | ICD-10-CM

## 2021-07-13 DIAGNOSIS — I10 Essential (primary) hypertension: Secondary | ICD-10-CM

## 2021-07-13 DIAGNOSIS — E785 Hyperlipidemia, unspecified: Secondary | ICD-10-CM

## 2021-07-13 LAB — POCT GLYCOSYLATED HEMOGLOBIN (HGB A1C): HbA1c, POC (controlled diabetic range): 5.8 % (ref 0.0–7.0)

## 2021-07-13 LAB — GLUCOSE, POCT (MANUAL RESULT ENTRY): POC Glucose: 99 mg/dl (ref 70–99)

## 2021-07-13 MED ORDER — HYDROCHLOROTHIAZIDE 25 MG PO TABS: 25.0000 mg | ORAL_TABLET | Freq: Every day | ORAL | 1 refills | Status: DC

## 2021-07-13 MED ORDER — SIMVASTATIN 10 MG PO TABS: ORAL_TABLET | ORAL | 1 refills | Status: DC

## 2021-07-13 MED ORDER — GLIPIZIDE 10 MG PO TABS: 10.0000 mg | ORAL_TABLET | Freq: Two times a day (BID) | ORAL | 0 refills | Status: DC

## 2021-07-13 MED ORDER — TRULICITY 0.75 MG/0.5ML ~~LOC~~ SOAJ: 0.7500 mg | SUBCUTANEOUS | 5 refills | Status: DC

## 2021-07-13 MED ORDER — GLIPIZIDE 10 MG PO TABS: 10.0000 mg | ORAL_TABLET | Freq: Two times a day (BID) | ORAL | 1 refills | Status: DC

## 2021-07-13 MED ORDER — AMLODIPINE BESYLATE 10 MG PO TABS: 10.0000 mg | ORAL_TABLET | Freq: Every day | ORAL | 1 refills | Status: DC

## 2021-07-13 MED ORDER — ISOSORBIDE MONONITRATE ER 60 MG PO TB24: 60.0000 mg | ORAL_TABLET | Freq: Every day | ORAL | 0 refills | Status: DC

## 2021-07-13 NOTE — Progress Notes (Signed)
Andrea Brown, is a 70 y.o. female  XLK:[id number]  [id number]  DOB - 1951/10/03  Chief Complaint  Patient presents with   Diabetes       Subjective:   Andrea Brown is a 70 y.o. female here today for a follow up visit. Patient has No headache, No chest pain, No abdominal pain - No Nausea, No new weakness tingling or numbness, No Cough - SOB. 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 Authorizing Provider  aspirin 81 MG tablet Take 1 tablet (81 mg total) daily by mouth. 10/17/17  Yes Jegede, Olugbemiga E, MD  Blood Glucose Monitoring Suppl (ACCU-CHEK GUIDE ME) w/Device KIT 1 each by Does not apply route 3 (three) times daily before meals. 03/23/20  Yes Newlin, Charlane Ferretti, MD  glucose blood (ACCU-CHEK GUIDE) test strip Use as instructed 1 to 2 times per day. E11.21 04/05/20  Yes Charlott Rakes, MD  Misc. Devices MISC Blood pressure monitor; diagnosis-hypertension 09/10/19  Yes Newlin, Enobong, MD  oxyCODONE (OXY IR/ROXICODONE) 5 MG immediate release tablet Take 1-2 tablets (5-10 mg total) by mouth every 6 (six) hours as needed for moderate pain, severe pain or breakthrough pain. 02/23/20  Yes Autumn Messing III, MD  amLODipine (NORVASC) 10 MG tablet Take 1 tablet (10 mg total) by mouth daily. 07/13/21   Argentina Donovan, PA-C  Dulaglutide (TRULICITY) 9.56 LO/7.5IE SOPN Inject 0.75 mg into the skin once a week. 07/13/21   Argentina Donovan, PA-C  glipiZIDE (GLUCOTROL) 10 MG tablet Take 1 tablet (10 mg total) by mouth 2 (two) times daily before a meal. 07/13/21   Kassim Guertin, Dionne Bucy, PA-C  hydrochlorothiazide (HYDRODIURIL) 25 MG tablet Take 1  tablet (25 mg total) by mouth daily. 07/13/21   Argentina Donovan, PA-C  isosorbide mononitrate (IMDUR) 60 MG 24 hr tablet Take 1 tablet (60 mg total) by mouth daily. 07/13/21   Argentina Donovan, PA-C  simvastatin (ZOCOR) 10 MG tablet TAKE 1 TABLET EVERY DAY AT 6 PM. 07/13/21   Argentina Donovan, PA-C    Objective:   Vitals:   07/13/21 0856  BP: 138/68  Pulse: 72  SpO2: 97%  Weight: 145 lb (65.8 kg)  Height: 5' 1"  (1.549 m)   Exam General appearance : Awake, alert, not in any distress. Speech Clear. Not toxic looking HEENT: Atraumatic and Normocephalic, pupils equally reactive to light and accomodation Neck: Supple, no JVD. No cervical lymphadenopathy.  Chest: Good air entry bilaterally; clear CVS: S1 S2 regular, no murmurs.  Abdomen: Bowel sounds present, Non tender and not distended with no gaurding, rigidity or rebound. Extremities: B/L Lower Ext shows no edema, both legs are warm to touch Neurology: Awake alert, and oriented X 3, CN II-XII intact, Non focal Skin: No Rash  Data Review Lab Results  Component Value Date   HGBA1C 5.8 07/13/2021   HGBA1C 5.8 12/28/2020   HGBA1C 7.4 (A) 06/24/2020    Assessment & Plan   1. Type 2 diabetes mellitus with diabetic nephropathy, without long-term current use of insulin (HCC) Controlled. Continue current regimen - Glucose (CBG) - HgB  A1c - Dulaglutide (TRULICITY) 8.50 YD/7.4JO SOPN; Inject 0.75 mg into the skin once a week.  Dispense: 6 mL; Refill: 5 - Comprehensive metabolic panel - CBC with Differential/Platelet - glipiZIDE (GLUCOTROL) 10 MG tablet; Take 1 tablet (10 mg total) by mouth 2 (two) times daily before a meal.  Dispense: 180 tablet; Refill: 1  2. Essential hypertension controlled - hydrochlorothiazide (HYDRODIURIL) 25 MG tablet; Take 1 tablet (25 mg total) by mouth daily.  Dispense: 90 tablet; Refill: 1 - isosorbide mononitrate (IMDUR) 60 MG 24 hr tablet; Take 1 tablet (60 mg total) by mouth daily.  Dispense: 30  tablet; Refill: 0 - amLODipine (NORVASC) 10 MG tablet; Take 1 tablet (10 mg total) by mouth daily.  Dispense: 90 tablet; Refill: 1 - Comprehensive metabolic panel - CBC with Differential/Platelet  3. Dyslipidemia - simvastatin (ZOCOR) 10 MG tablet; TAKE 1 TABLET EVERY DAY AT 6 PM.  Dispense: 90 tablet; Refill: 1 - Lipid panel    Patient have been counseled extensively about nutrition and exercise. Other issues discussed during this visit include: low cholesterol diet, weight control and daily exercise, foot care, annual eye examinations at Ophthalmology, importance of adherence with medications and regular follow-up. We also discussed long term complications of uncontrolled diabetes and hypertension.   F/up PCP in 6 months  The patient was given clear instructions to go to ER or return to medical center if symptoms don't improve, worsen or new problems develop. The patient verbalized understanding. The patient was told to call to get lab results if they haven't heard anything in the next week.      Freeman Caldron, MD, MHA, Karilyn Cota, [facility], [place]   07/13/2021, 9:22 AM Patient ID: Andrea Brown, female   DOB: 1950-12-17, 70 y.o.   MRN: [id number]

## 2021-07-14 LAB — CBC WITH DIFFERENTIAL/PLATELET
Basophils Absolute: 0 10*3/uL (ref 0.0–0.2)
Basos: 0 %
EOS (ABSOLUTE): 0.1 10*3/uL (ref 0.0–0.4)
Eos: 2 %
Hematocrit: 33.3 % — ABNORMAL LOW (ref 34.0–46.6)
Hemoglobin: 11 g/dL — ABNORMAL LOW (ref 11.1–15.9)
Immature Grans (Abs): 0 10*3/uL (ref 0.0–0.1)
Immature Granulocytes: 0 %
Lymphocytes Absolute: 1.9 10*3/uL (ref 0.7–3.1)
Lymphs: 35 %
MCH: 28.9 pg (ref 26.6–33.0)
MCHC: 33 g/dL (ref 31.5–35.7)
MCV: 88 fL (ref 79–97)
Monocytes Absolute: 0.4 10*3/uL (ref 0.1–0.9)
Monocytes: 7 %
Neutrophils Absolute: 3 10*3/uL (ref 1.4–7.0)
Neutrophils: 56 %
Platelets: 215 10*3/uL (ref 150–450)
RBC: 3.8 x10E6/uL (ref 3.77–5.28)
RDW: 14.3 % (ref 11.7–15.4)
WBC: 5.4 10*3/uL (ref 3.4–10.8)

## 2021-07-14 LAB — COMPREHENSIVE METABOLIC PANEL
ALT: 11 IU/L (ref 0–32)
AST: 16 IU/L (ref 0–40)
Albumin/Globulin Ratio: 2.2 (ref 1.2–2.2)
Albumin: 4.7 g/dL (ref 3.7–4.7)
Alkaline Phosphatase: 62 IU/L (ref 44–121)
BUN/Creatinine Ratio: 11 — ABNORMAL LOW (ref 12–28)
BUN: 18 mg/dL (ref 8–27)
Bilirubin Total: <0.2 mg/dL (ref 0.0–1.2)
CO2: 22 mmol/L (ref 20–29)
Calcium: 9.5 mg/dL (ref 8.7–10.3)
Chloride: 105 mmol/L (ref 96–106)
Creatinine, Ser: 1.68 mg/dL — ABNORMAL HIGH (ref 0.57–1.00)
Globulin, Total: 2.1 g/dL (ref 1.5–4.5)
Glucose: 68 mg/dL (ref 65–99)
Potassium: 4.2 mmol/L (ref 3.5–5.2)
Sodium: 144 mmol/L (ref 134–144)
Total Protein: 6.8 g/dL (ref 6.0–8.5)
eGFR: 32 mL/min/{1.73_m2} — ABNORMAL LOW (ref >59)

## 2021-07-14 LAB — LIPID PANEL
Chol/HDL Ratio: 2.1 ratio (ref 0.0–4.4)
Cholesterol, Total: 133 mg/dL (ref 100–199)
HDL: 63 mg/dL (ref >39)
LDL Chol Calc (NIH): 58 mg/dL (ref 0–99)
Triglycerides: 53 mg/dL (ref 0–149)
VLDL Cholesterol Cal: 12 mg/dL (ref 5–40)

## 2021-08-27 ENCOUNTER — Telehealth: Payer: Self-pay

## 2021-08-27 ENCOUNTER — Ambulatory Visit (HOSPITAL_BASED_OUTPATIENT_CLINIC_OR_DEPARTMENT_OTHER): Payer: Medicare HMO

## 2021-08-27 DIAGNOSIS — Z0000 Encounter for general adult medical examination without abnormal findings: Secondary | ICD-10-CM

## 2021-08-27 DIAGNOSIS — Z5329 Procedure and treatment not carried out because of patient's decision for other reasons: Secondary | ICD-10-CM

## 2021-08-27 NOTE — Patient Instructions (Signed)
Health Maintenance, Female Adopting a healthy lifestyle and getting preventive care are important in promoting health and wellness. Ask your health care provider about: The right schedule for you to have regular tests and exams. Things you can do on your own to prevent diseases and keep yourself healthy. What should I know about diet, weight, and exercise? Eat a healthy diet  Eat a diet that includes plenty of vegetables, fruits, low-fat dairy products, and lean protein. Do not eat a lot of foods that are high in solid fats, added sugars, or sodium. Maintain a healthy weight Body mass index (BMI) is used to identify weight problems. It estimates body fat based on height and weight. Your health care provider can help determine your BMI and help you achieve or maintain a healthy weight. Get regular exercise Get regular exercise. This is one of the most important things you can do for your health. Most adults should: Exercise for at least 150 minutes each week. The exercise should increase your heart rate and make you sweat (moderate-intensity exercise). Do strengthening exercises at least twice a week. This is in addition to the moderate-intensity exercise. Spend less time sitting. Even light physical activity can be beneficial. Watch cholesterol and blood lipids Have your blood tested for lipids and cholesterol at 72 years of age, then have this test every 5 years. Have your cholesterol levels checked more often if: Your lipid or cholesterol levels are high. You are older than 72 years of age. You are at high risk for heart disease. What should I know about cancer screening? Depending on your health history and family history, you may need to have cancer screening at various ages. This may include screening for: Breast cancer. Cervical cancer. Colorectal cancer. Skin cancer. Lung cancer. What should I know about heart disease, diabetes, and high blood pressure? Blood pressure and heart  disease High blood pressure causes heart disease and increases the risk of stroke. This is more likely to develop in people who have high blood pressure readings, are of African descent, or are overweight. Have your blood pressure checked: Every 3-5 years if you are 18-39 years of age. Every year if you are 40 years old or older. Diabetes Have regular diabetes screenings. This checks your fasting blood sugar level. Have the screening done: Once every three years after age 40 if you are at a normal weight and have a low risk for diabetes. More often and at a younger age if you are overweight or have a high risk for diabetes. What should I know about preventing infection? Hepatitis B If you have a higher risk for hepatitis B, you should be screened for this virus. Talk with your health care provider to find out if you are at risk for hepatitis B infection. Hepatitis C Testing is recommended for: Everyone born from 1945 through 1965. Anyone with known risk factors for hepatitis C. Sexually transmitted infections (STIs) Get screened for STIs, including gonorrhea and chlamydia, if: You are sexually active and are younger than 72 years of age. You are older than 72 years of age and your health care provider tells you that you are at risk for this type of infection. Your sexual activity has changed since you were last screened, and you are at increased risk for chlamydia or gonorrhea. Ask your health care provider if you are at risk. Ask your health care provider about whether you are at high risk for HIV. Your health care provider may recommend a prescription medicine   to help prevent HIV infection. If you choose to take medicine to prevent HIV, you should first get tested for HIV. You should then be tested every 3 months for as long as you are taking the medicine. Pregnancy If you are about to stop having your period (premenopausal) and you may become pregnant, seek counseling before you get  pregnant. Take 400 to 800 micrograms (mcg) of folic acid every day if you become pregnant. Ask for birth control (contraception) if you want to prevent pregnancy. Osteoporosis and menopause Osteoporosis is a disease in which the bones lose minerals and strength with aging. This can result in bone fractures. If you are 65 years old or older, or if you are at risk for osteoporosis and fractures, ask your health care provider if you should: Be screened for bone loss. Take a calcium or vitamin D supplement to lower your risk of fractures. Be given hormone replacement therapy (HRT) to treat symptoms of menopause. Follow these instructions at home: Lifestyle Do not use any products that contain nicotine or tobacco, such as cigarettes, e-cigarettes, and chewing tobacco. If you need help quitting, ask your health care provider. Do not use street drugs. Do not share needles. Ask your health care provider for help if you need support or information about quitting drugs. Alcohol use Do not drink alcohol if: Your health care provider tells you not to drink. You are pregnant, may be pregnant, or are planning to become pregnant. If you drink alcohol: Limit how much you use to 0-1 drink a day. Limit intake if you are breastfeeding. Be aware of how much alcohol is in your drink. In the [place] one drink equals one 12 oz bottle of beer (355 mL), one 5 oz glass of wine (148 mL), or one 1 oz glass of hard liquor (44 mL). General instructions Schedule regular health, dental, and eye exams. Stay current with your vaccines. Tell your health care provider if: You often feel depressed. You have ever been abused or do not feel safe at home. Summary Adopting a healthy lifestyle and getting preventive care are important in promoting health and wellness. Follow your health care provider's instructions about healthy diet, exercising, and getting tested or screened for diseases. Follow your health care provider's  instructions on monitoring your cholesterol and blood pressure. This information is not intended to replace advice given to you by your health care provider. Make sure you discuss any questions you have with your health care provider. Document Revised: 01/28/2021 Document Reviewed: 11/13/2018 Elsevier Patient Education  2022 Elsevier Inc.  

## 2021-08-27 NOTE — Telephone Encounter (Signed)
Called pt to complete annual wellness visit. Pt upset that I had her phone number and personal information, I advised pt that I work for [employer]. This service was through medicare. Pt states that she does not want to complete the visit.

## 2021-10-14 ENCOUNTER — Other Ambulatory Visit: Payer: Self-pay | Admitting: Family Medicine

## 2021-10-14 DIAGNOSIS — I10 Essential (primary) hypertension: Secondary | ICD-10-CM

## 2021-10-14 MED ORDER — ISOSORBIDE MONONITRATE ER 60 MG PO TB24: 60.0000 mg | ORAL_TABLET | Freq: Every day | ORAL | 2 refills | Status: DC

## 2021-10-14 NOTE — Telephone Encounter (Signed)
Requested Prescriptions  Pending Prescriptions Disp Refills  . isosorbide mononitrate (IMDUR) 60 MG 24 hr tablet 30 tablet 2    Sig: Take 1 tablet (60 mg total) by mouth daily.     Cardiovascular:  Nitrates Passed - 10/14/2021  5:31 PM      Passed - Last BP in normal range    BP Readings from Last 1 Encounters:  07/13/21 138/68         Passed - Last Heart Rate in normal range    Pulse Readings from Last 1 Encounters:  07/13/21 72         Passed - Valid encounter within last 12 months    Recent Outpatient Visits          3 months ago Type 2 diabetes mellitus with diabetic nephropathy, without long-term current use of insulin [facility])   [place], [place], [place]   9 months ago Type 2 diabetes mellitus with diabetic nephropathy, without long-term current use of insulin ([place])   [place], [place], MD   1 year ago Type 2 diabetes mellitus with diabetic nephropathy, without long-term current use of insulin ([place])   [place], [place], MD   1 year ago Type 2 diabetes mellitus with diabetic nephropathy, without long-term current use of insulin [facility])   [place], RPH-CPP   1 year ago Tobacco abuse   [place], Enobong, MD      Future Appointments            In 3 months [place], Dionne Bucy, PA-C [place]

## 2021-10-14 NOTE — Telephone Encounter (Signed)
Medication Refill - Medication:   isosorbide mononitrate (IMDUR) 60 MG 24 hr tablet   Has the patient contacted their pharmacy? Yes.   [facility] called directly, unable to fax over the refill request, requested a message be sent to Dr Margarita Rana directly.    Preferred Pharmacy (with phone number or street name):   [facility], [place]  [place] [address]  Phone: [phone] Fax: [phone]   Has the patient been seen for an appointment in the last year OR does the patient have an upcoming appointment? Yes.    Agent: Please be advised that RX refills may take up to 3 business days. We ask that you follow-up with your pharmacy.

## 2021-10-18 DIAGNOSIS — N390 Urinary tract infection, site not specified: Secondary | ICD-10-CM | POA: Diagnosis not present

## 2021-10-18 DIAGNOSIS — N184 Chronic kidney disease, stage 4 (severe): Secondary | ICD-10-CM | POA: Diagnosis not present

## 2021-10-18 DIAGNOSIS — I129 Hypertensive chronic kidney disease with stage 1 through stage 4 chronic kidney disease, or unspecified chronic kidney disease: Secondary | ICD-10-CM | POA: Diagnosis not present

## 2021-10-18 DIAGNOSIS — E1122 Type 2 diabetes mellitus with diabetic chronic kidney disease: Secondary | ICD-10-CM | POA: Diagnosis not present

## 2021-10-18 DIAGNOSIS — F17200 Nicotine dependence, unspecified, uncomplicated: Secondary | ICD-10-CM | POA: Diagnosis not present

## 2021-12-29 ENCOUNTER — Other Ambulatory Visit: Payer: Self-pay | Admitting: Family Medicine

## 2021-12-29 DIAGNOSIS — I10 Essential (primary) hypertension: Secondary | ICD-10-CM

## 2021-12-29 NOTE — Telephone Encounter (Signed)
Requested Prescriptions  Pending Prescriptions Disp Refills   isosorbide mononitrate (IMDUR) 60 MG 24 hr tablet [Pharmacy Med Name: ISOSORBIDE MONONITRATE ER 60 MG Tablet Extended Release 24 Hour] 90 tablet 0    Sig: TAKE 1 TABLET EVERY DAY     Cardiovascular:  Nitrates Passed - 12/29/2021 11:46 AM      Passed - Last BP in normal range    BP Readings from Last 1 Encounters:  07/13/21 138/68         Passed - Last Heart Rate in normal range    Pulse Readings from Last 1 Encounters:  07/13/21 72         Passed - Valid encounter within last 12 months    Recent Outpatient Visits          5 months ago Type 2 diabetes mellitus with diabetic nephropathy, without long-term current use of insulin ([place])   [place], [place], [place]   1 year ago Type 2 diabetes mellitus with diabetic nephropathy, without long-term current use of insulin ([place])   [place], [place], MD   1 year ago Type 2 diabetes mellitus with diabetic nephropathy, without long-term current use of insulin ([place])   [place], [place], MD   1 year ago Type 2 diabetes mellitus with diabetic nephropathy, without long-term current use of insulin [facility])   [place], RPH-CPP   1 year ago Tobacco abuse   [place], MD      Future Appointments            In 3 weeks [place], Dionne Bucy, PA-C [place]

## 2022-01-07 ENCOUNTER — Other Ambulatory Visit: Payer: Self-pay | Admitting: Physician Assistant

## 2022-01-07 DIAGNOSIS — I10 Essential (primary) hypertension: Secondary | ICD-10-CM

## 2022-01-07 DIAGNOSIS — E785 Hyperlipidemia, unspecified: Secondary | ICD-10-CM

## 2022-01-07 NOTE — Telephone Encounter (Signed)
Requested Prescriptions  Pending Prescriptions Disp Refills   hydrochlorothiazide (HYDRODIURIL) 25 MG tablet [Pharmacy Med Name: HYDROCHLOROTHIAZIDE 25 MG Tablet] 90 tablet 0    Sig: TAKE 1 TABLET EVERY DAY     Cardiovascular: Diuretics - Thiazide Failed - 01/07/2022  4:47 AM      Failed - Cr in normal range and within 180 days    Creat  Date Value Ref Range Status  10/05/2016 2.31 (H) 0.50 - 0.99 mg/dL Final    Comment:      For patients > or = 73 years of age: The upper reference limit for Creatinine is approximately 13% higher for people identified as African-American.      Creatinine, Ser  Date Value Ref Range Status  07/13/2021 1.68 (H) 0.57 - 1.00 mg/dL Final   Creatinine, Urine  Date Value Ref Range Status  10/05/2016 116 20 - 320 mg/dL Final         Passed - K in normal range and within 180 days    Potassium  Date Value Ref Range Status  07/13/2021 4.2 3.5 - 5.2 mmol/L Final         Passed - Na in normal range and within 180 days    Sodium  Date Value Ref Range Status  07/13/2021 144 134 - 144 mmol/L Final         Passed - Last BP in normal range    BP Readings from Last 1 Encounters:  07/13/21 138/68         Passed - Valid encounter within last 6 months    Recent Outpatient Visits          5 months ago Type 2 diabetes mellitus with diabetic nephropathy, without long-term current use of insulin ([place])   [place], [place], [place]   1 year ago Type 2 diabetes mellitus with diabetic nephropathy, without long-term current use of insulin ([place])   [place], [place], MD   1 year ago Type 2 diabetes mellitus with diabetic nephropathy, without long-term current use of insulin ([place])   [place], [place], MD   1 year ago Type 2 diabetes mellitus with diabetic nephropathy, without long-term current use of insulin ([place])   [place], RPH-CPP   1 year ago Tobacco abuse   [place], [place], MD      Future Appointments            In 1 week [place], Dionne Bucy, PA-C [place]            simvastatin (ZOCOR) 10 MG tablet [Pharmacy Med Name: SIMVASTATIN 10 MG Tablet] 90 tablet 1    Sig: TAKE 1 TABLET EVERY DAY AT 6 PM.     Cardiovascular:  Antilipid - Statins Failed - 01/07/2022  4:47 AM      Failed - Lipid Panel in normal range within the last 12 months    Cholesterol, Total  Date Value Ref Range Status  07/13/2021 133 100 - 199 mg/dL Final   LDL Chol Calc (NIH)  Date Value Ref Range Status  07/13/2021 58 0 - 99 mg/dL Final   Direct LDL  Date Value Ref Range Status  09/09/2009 40 mg/dL Final    Comment:    See lab report for associated comment(s)   HDL  Date Value Ref Range  Status  07/13/2021 63 >39 mg/dL Final   Triglycerides  Date Value Ref Range Status  07/13/2021 53 0 - 149 mg/dL Final         Passed - Patient is not pregnant      Passed - Valid encounter within last 12 months    Recent Outpatient Visits          5 months ago Type 2 diabetes mellitus with diabetic nephropathy, without long-term current use of insulin ([place])   [place], [place], [place]   1 year ago Type 2 diabetes mellitus with diabetic nephropathy, without long-term current use of insulin ([place])   [place], [place], MD   1 year ago Type 2 diabetes mellitus with diabetic nephropathy, without long-term current use of insulin ([place])   [place], [place], MD   1 year ago Type 2 diabetes mellitus with diabetic nephropathy, without long-term current use of insulin ([place])   [place], Jarome Matin, RPH-CPP   1 year ago Tobacco abuse   [place], [place], MD      Future Appointments            In 1 week [place], Dionne Bucy, PA-C [place]            amLODipine (NORVASC) 10 MG tablet [Pharmacy Med Name: AMLODIPINE BESYLATE 10 MG Tablet] 90 tablet 0    Sig: TAKE 1 TABLET EVERY DAY     Cardiovascular: Calcium Channel Blockers 2 Passed - 01/07/2022  4:47 AM      Passed - Last BP in normal range    BP Readings from Last 1 Encounters:  07/13/21 138/68         Passed - Last Heart Rate in normal range    Pulse Readings from Last 1 Encounters:  07/13/21 72         Passed - Valid encounter within last 6 months    Recent Outpatient Visits          5 months ago Type 2 diabetes mellitus with diabetic nephropathy, without long-term current use of insulin ([place])   [place], [place], [place]   1 year ago Type 2 diabetes mellitus with diabetic nephropathy, without long-term current use of insulin ([place])   [place], [place], MD   1 year ago Type 2 diabetes mellitus with diabetic nephropathy, without long-term current use of insulin ([place])   [place], [place], MD   1 year ago Type 2 diabetes mellitus with diabetic nephropathy, without long-term current use of insulin [facility])   [place], RPH-CPP   1 year ago Tobacco abuse   [place], MD      Future Appointments            In 1 week [place], Dionne Bucy, PA-C [place]

## 2022-01-19 ENCOUNTER — Encounter: Payer: Self-pay | Admitting: Physician Assistant

## 2022-01-19 ENCOUNTER — Other Ambulatory Visit: Payer: Self-pay

## 2022-01-19 ENCOUNTER — Ambulatory Visit: Payer: Medicare HMO | Attending: Physician Assistant | Admitting: Physician Assistant

## 2022-01-19 VITALS — BP 132/74 | HR 72 | Ht 61.0 in | Wt 147.4 lb

## 2022-01-19 DIAGNOSIS — I10 Essential (primary) hypertension: Secondary | ICD-10-CM

## 2022-01-19 DIAGNOSIS — E785 Hyperlipidemia, unspecified: Secondary | ICD-10-CM | POA: Diagnosis not present

## 2022-01-19 DIAGNOSIS — E1121 Type 2 diabetes mellitus with diabetic nephropathy: Secondary | ICD-10-CM | POA: Diagnosis not present

## 2022-01-19 LAB — GLUCOSE, POCT (MANUAL RESULT ENTRY): POC Glucose: 139 mg/dl — AB (ref 70–99)

## 2022-01-19 LAB — POCT GLYCOSYLATED HEMOGLOBIN (HGB A1C): Hemoglobin A1C: 5.9 % — AB (ref 4.0–5.6)

## 2022-01-19 MED ORDER — GLIPIZIDE 10 MG PO TABS: 10.0000 mg | ORAL_TABLET | Freq: Two times a day (BID) | ORAL | 1 refills | Status: DC

## 2022-01-19 MED ORDER — ISOSORBIDE MONONITRATE ER 60 MG PO TB24: 60.0000 mg | ORAL_TABLET | Freq: Every day | ORAL | 1 refills | Status: DC

## 2022-01-19 MED ORDER — HYDROCHLOROTHIAZIDE 25 MG PO TABS: 25.0000 mg | ORAL_TABLET | Freq: Every day | ORAL | 1 refills | Status: DC

## 2022-01-19 MED ORDER — SIMVASTATIN 10 MG PO TABS: ORAL_TABLET | ORAL | 1 refills | Status: DC

## 2022-01-19 MED ORDER — TRULICITY 0.75 MG/0.5ML ~~LOC~~ SOAJ: 0.7500 mg | SUBCUTANEOUS | 5 refills | Status: DC

## 2022-01-19 NOTE — Progress Notes (Signed)
Patient ID: Andrea Brown, female   DOB: 1951-10-11, 71 y.o.   MRN: [id number]   Andrea Brown, is a 71 y.o. female  NGE:[id number]  [id number]  DOB - 01-Oct-1951  Chief Complaint  Patient presents with   Diabetes       Subjective:   Andrea Brown is a 71 y.o. female here today for med RF.  No issues or concerns.  Doing well.  Accomplishes ADL without issues. Needs med RF  No problems updated.  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 Authorizing Provider  amLODipine (NORVASC) 10 MG tablet TAKE 1 TABLET EVERY DAY 01/07/22  Yes Charlott Rakes, MD  aspirin 81 MG tablet Take 1 tablet (81 mg total) daily by mouth. 10/17/17  Yes Jegede, Olugbemiga E, MD  Blood Glucose Monitoring Suppl (ACCU-CHEK GUIDE ME) w/Device KIT 1 each by Does not apply route 3 (three) times daily before meals. 03/23/20  Yes Newlin, Charlane Ferretti, MD  glucose blood (ACCU-CHEK GUIDE) test strip Use as instructed 1 to 2 times per day. E11.21 04/05/20  Yes Newlin, Charlane Ferretti, MD  Dulaglutide (TRULICITY) 4.03 KV/4.2VZ SOPN Inject 0.75 mg into the skin once a week. 01/19/22   Argentina Donovan, PA-C  glipiZIDE (GLUCOTROL) 10 MG tablet Take 1 tablet (10 mg total) by mouth 2 (two) times daily before a meal. 01/19/22   McClung, Dionne Bucy, PA-C  hydrochlorothiazide (HYDRODIURIL) 25 MG tablet Take 1 tablet (25 mg total) by mouth daily. 01/19/22   Argentina Donovan, PA-C  isosorbide mononitrate (IMDUR) 60 MG 24 hr tablet Take 1 tablet (60 mg total) by mouth daily. 01/19/22   Argentina Donovan, PA-C  Misc. Devices MISC Blood pressure monitor; diagnosis-hypertension Patient not taking: Reported on 01/19/2022  09/10/19   Charlott Rakes, MD  oxyCODONE (OXY IR/ROXICODONE) 5 MG immediate release tablet Take 1-2 tablets (5-10 mg total) by mouth every 6 (six) hours as needed for moderate pain, severe pain or breakthrough pain. Patient not taking: Reported on 01/19/2022 02/23/20   Autumn Messing III, MD  simvastatin (ZOCOR) 10 MG tablet 1 at bedtime 01/19/22   Argentina Donovan, PA-C    ROS: Neg HEENT Neg resp Neg cardiac Neg GI Neg GU Neg MS Neg psych Neg neuro  Objective:   Vitals:   01/19/22 0901  BP: 132/74  Pulse: 72  SpO2: 96%  Weight: 147 lb 6 oz (66.8 kg)  Height: 5' 1" (1.549 m)   Exam General appearance : Awake, alert, not in any distress. Speech Clear. Not toxic looking HEENT: Atraumatic and Normocephalic Neck: Supple, no JVD. No cervical lymphadenopathy.  Chest: Good air entry bilaterally, CTAB.  No rales/rhonchi/wheezing CVS: S1 S2 regular, no murmurs.  Extremities: B/L Lower Ext shows no edema, both legs are warm to touch Neurology: Awake alert, and oriented X 3, CN II-XII intact, Non focal Skin: No Rash  Data Review Lab Results  Component Value Date   HGBA1C 5.9 (A) 01/19/2022   HGBA1C 5.8 07/13/2021   HGBA1C 5.8 12/28/2020    Assessment & Plan   1. Type 2 diabetes mellitus with diabetic nephropathy, without long-term current use of insulin (  [place]) Controlled-continue current regimen - Glucose (CBG) - HgB A1c - CBC with Differential/Platelet - Comprehensive metabolic panel - Dulaglutide (TRULICITY) 9.32 IZ/1.2WP SOPN; Inject 0.75 mg into the skin once a week.  Dispense: 6 mL; Refill: 5 - glipiZIDE (GLUCOTROL) 10 MG tablet; Take 1 tablet (10 mg total) by mouth 2 (two) times daily before a meal.  Dispense: 180 tablet; Refill: 1  2. Essential hypertension Controlled-continue - CBC with Differential/Platelet - Comprehensive metabolic panel - hydrochlorothiazide (HYDRODIURIL) 25 MG tablet; Take 1 tablet (25 mg total) by mouth daily.  Dispense: 90 tablet; Refill: 1 -  isosorbide mononitrate (IMDUR) 60 MG 24 hr tablet; Take 1 tablet (60 mg total) by mouth daily.  Dispense: 90 tablet; Refill: 1  3. Dyslipidemia Normal lipids on meds 07/2021 - simvastatin (ZOCOR) 10 MG tablet; 1 at bedtime  Dispense: 90 tablet; Refill: 1    Patient have been counseled extensively about nutrition and exercise. Other issues discussed during this visit include: low cholesterol diet, weight control and daily exercise, foot care, annual eye examinations at Ophthalmology, importance of adherence with medications and regular follow-up. We also discussed long term complications of uncontrolled diabetes and hypertension.   Return in about 6 months (around 07/19/2022) for PCP/chronic conditions.  The patient was given clear instructions to go to ER or return to medical center if symptoms don't improve, worsen or new problems develop. The patient verbalized understanding. The patient was told to call to get lab results if they haven't heard anything in the next week.      Freeman Caldron, PA-C [facility] [place], [place]   01/19/2022, 9:23 AM

## 2022-01-19 NOTE — Progress Notes (Signed)
9

## 2022-01-20 LAB — CBC WITH DIFFERENTIAL/PLATELET
Basophils Absolute: 0 10*3/uL (ref 0.0–0.2)
Basos: 0 %
EOS (ABSOLUTE): 0.1 10*3/uL (ref 0.0–0.4)
Eos: 2 %
Hematocrit: 34 % (ref 34.0–46.6)
Hemoglobin: 11.1 g/dL (ref 11.1–15.9)
Immature Grans (Abs): 0 10*3/uL (ref 0.0–0.1)
Immature Granulocytes: 1 %
Lymphocytes Absolute: 2 10*3/uL (ref 0.7–3.1)
Lymphs: 35 %
MCH: 28.6 pg (ref 26.6–33.0)
MCHC: 32.6 g/dL (ref 31.5–35.7)
MCV: 88 fL (ref 79–97)
Monocytes Absolute: 0.3 10*3/uL (ref 0.1–0.9)
Monocytes: 6 %
Neutrophils Absolute: 3.2 10*3/uL (ref 1.4–7.0)
Neutrophils: 56 %
Platelets: 225 10*3/uL (ref 150–450)
RBC: 3.88 x10E6/uL (ref 3.77–5.28)
RDW: 14.5 % (ref 11.7–15.4)
WBC: 5.7 10*3/uL (ref 3.4–10.8)

## 2022-01-20 LAB — COMPREHENSIVE METABOLIC PANEL
ALT: 11 IU/L (ref 0–32)
AST: 20 IU/L (ref 0–40)
Albumin/Globulin Ratio: 1.8 (ref 1.2–2.2)
Albumin: 4.5 g/dL (ref 3.7–4.7)
Alkaline Phosphatase: 69 IU/L (ref 44–121)
BUN/Creatinine Ratio: 17 (ref 12–28)
BUN: 32 mg/dL — ABNORMAL HIGH (ref 8–27)
Bilirubin Total: 0.2 mg/dL (ref 0.0–1.2)
CO2: 22 mmol/L (ref 20–29)
Calcium: 9.6 mg/dL (ref 8.7–10.3)
Chloride: 106 mmol/L (ref 96–106)
Creatinine, Ser: 1.9 mg/dL — ABNORMAL HIGH (ref 0.57–1.00)
Globulin, Total: 2.5 g/dL (ref 1.5–4.5)
Glucose: 129 mg/dL — ABNORMAL HIGH (ref 70–99)
Potassium: 4.5 mmol/L (ref 3.5–5.2)
Sodium: 144 mmol/L (ref 134–144)
Total Protein: 7 g/dL (ref 6.0–8.5)
eGFR: 28 mL/min/{1.73_m2} — ABNORMAL LOW (ref >59)

## 2022-01-26 ENCOUNTER — Telehealth: Payer: Self-pay

## 2022-01-26 NOTE — Telephone Encounter (Signed)
Copied from [place] [id number]. Topic: General - Call Back - No Documentation >> Jan 26, 2022 12:39 PM Erick Blinks wrote: Reason for CRM: Pt wants to discuss her lab results, please advise if PEC may disclose   Best contact: [phone]

## 2022-01-26 NOTE — Telephone Encounter (Signed)
Patient returned call and notified of her lab results and provider recommendations:  Please call the patient.  Tell her to work on blood sugar and blood pressure management to help with kidney function.  She should also drink plenty of water.  Liver function, electrolytes and blood count are normal.  Follow up as planned.  Thanks, Freeman Caldron, PA-C

## 2022-02-03 ENCOUNTER — Other Ambulatory Visit: Payer: Self-pay | Admitting: Family Medicine

## 2022-02-03 DIAGNOSIS — Z1231 Encounter for screening mammogram for malignant neoplasm of breast: Secondary | ICD-10-CM

## 2022-03-17 ENCOUNTER — Ambulatory Visit
Admission: RE | Admit: 2022-03-17 | Discharge: 2022-03-17 | Disposition: A | Discharge status: Home / Self Care | Payer: Medicare HMO | Source: Ambulatory Visit | Attending: Family Medicine | Admitting: Family Medicine

## 2022-03-17 DIAGNOSIS — Z1231 Encounter for screening mammogram for malignant neoplasm of breast: Secondary | ICD-10-CM | POA: Diagnosis not present

## 2022-05-02 DIAGNOSIS — E1122 Type 2 diabetes mellitus with diabetic chronic kidney disease: Secondary | ICD-10-CM | POA: Diagnosis not present

## 2022-05-02 DIAGNOSIS — N390 Urinary tract infection, site not specified: Secondary | ICD-10-CM | POA: Diagnosis not present

## 2022-05-02 DIAGNOSIS — F17200 Nicotine dependence, unspecified, uncomplicated: Secondary | ICD-10-CM | POA: Diagnosis not present

## 2022-05-02 DIAGNOSIS — I129 Hypertensive chronic kidney disease with stage 1 through stage 4 chronic kidney disease, or unspecified chronic kidney disease: Secondary | ICD-10-CM | POA: Diagnosis not present

## 2022-05-02 DIAGNOSIS — N184 Chronic kidney disease, stage 4 (severe): Secondary | ICD-10-CM | POA: Diagnosis not present

## 2022-07-18 ENCOUNTER — Ambulatory Visit: Payer: Medicare HMO | Attending: Family Medicine

## 2022-07-18 DIAGNOSIS — Z0000 Encounter for general adult medical examination without abnormal findings: Secondary | ICD-10-CM

## 2022-07-18 NOTE — Progress Notes (Signed)
Subjective:   Andrea Brown is a 71 y.o. female who presents for Medicare Annual (Subsequent) preventive examination.  Review of Systems    Andrea Brown is a 71 y.o. female who presents for an Subsequent Medicare Annual Wellness Visit.   I connected with Molli Knock on 07/18/22 at 10:30am by telephone and verified that I am speaking with the correct person using two identifiers. I discussed the limitations, risks, security and privacy concerns of performing an evaluation and management service by telephone and the availability of in person appointments. I also discussed with the patient that there may be a patient responsible charge related to this service. The patient expressed understanding and agreed to proceed.   Patient location:  Home My Location:  [place] on the telephone call:  Patient and myself    Cardiac Risk Factors include: none     Objective:    There were no vitals filed for this visit. There is no height or weight on file to calculate BMI.     07/18/2022   10:31 AM 05/24/2020   10:02 AM 02/23/2020   11:07 AM 02/13/2020    3:30 PM 10/05/2016    9:59 AM 08/05/2014    9:26 AM  Advanced Directives  Does Patient Have a Medical Advance Directive? No No No No No No  Would patient like information on creating a medical advance directive? Yes (ED - Information included in AVS)  No - Patient declined No - Patient declined No - patient declined information     Current Medications (verified) Outpatient Encounter Medications as of 07/18/2022  Medication Sig   amLODipine (NORVASC) 10 MG tablet TAKE 1 TABLET EVERY DAY   aspirin 81 MG tablet Take 1 tablet (81 mg total) daily by mouth.   Blood Glucose Monitoring Suppl (ACCU-CHEK GUIDE ME) w/Device KIT 1 each by Does not apply route 3 (three) times daily before meals.   Dulaglutide (TRULICITY) 9.93 TT/0.1XB SOPN Inject 0.75 mg into the skin once a week.   glipiZIDE (GLUCOTROL) 10 MG tablet Take 1 tablet  (10 mg total) by mouth 2 (two) times daily before a meal.   glucose blood (ACCU-CHEK GUIDE) test strip Use as instructed 1 to 2 times per day. E11.21   hydrochlorothiazide (HYDRODIURIL) 25 MG tablet Take 1 tablet (25 mg total) by mouth daily.   isosorbide mononitrate (IMDUR) 60 MG 24 hr tablet Take 1 tablet (60 mg total) by mouth daily.   simvastatin (ZOCOR) 10 MG tablet 1 at bedtime   Misc. Devices MISC Blood pressure monitor; diagnosis-hypertension (Patient not taking: Reported on 01/19/2022)   oxyCODONE (OXY IR/ROXICODONE) 5 MG immediate release tablet Take 1-2 tablets (5-10 mg total) by mouth every 6 (six) hours as needed for moderate pain, severe pain or breakthrough pain. (Patient not taking: Reported on 01/19/2022)   No facility-administered encounter medications on file as of 07/18/2022.    Allergies (verified) Patient has no known allergies.   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   Diabetes mellitus without complication ([place])    Hyperlipidemia    Hypertension    Papilloma of right breast    Smoker    Past Surgical History:  Procedure Laterality Date   BREAST EXCISIONAL BIOPSY     BREAST LUMPECTOMY WITH RADIOACTIVE SEED LOCALIZATION Right 02/23/2020   Procedure: RIGHT BREAST LUMPECTOMY X 3 WITH RADIOACTIVE SEED LOCALIZATION;  Surgeon: Jovita Kussmaul, MD;  Location: [facility];  Service: General;  Laterality: Right;   TOTAL ABDOMINAL HYSTERECTOMY  2000   Family History  Problem Relation Age of Onset   Diabetes Mother    Hypertension Mother    Colon cancer Neg Hx    Esophageal cancer Neg Hx    Pancreatic cancer Neg Hx    Rectal cancer Neg Hx    Stomach cancer Neg Hx    Social History   Socioeconomic History   Marital status: Single    Spouse name: Not on file   Number of children: Not on file   Years of education: Not on file   Highest education level: Not on file  Occupational History   Not on  file  Tobacco Use   Smoking status: Every Day    Packs/day: 0.25    Types: Cigarettes   Smokeless tobacco: Never   Tobacco comments:    patient is trying EOD  Substance and Sexual Activity   Alcohol use: No   Drug use: No   Sexual activity: Not Currently    Birth control/protection: Surgical  Other Topics Concern   Not on file  Social History Narrative   Not on file   Social Determinants of Health   Financial Resource Strain: Not on file  Food Insecurity: Not on file  Transportation Needs: Not on file  Physical Activity: Not on file  Stress: Not on file  Social Connections: Not on file    Tobacco Counseling Ready to quit: Not Answered Counseling given: Not Answered Tobacco comments: patient is trying EOD   Clinical Intake:  Pre-visit preparation completed: No  Pain : No/denies pain          Diabetic?YES   Interpreter Needed?: No      Activities of Daily Living    07/18/2022   10:32 AM  In your present state of health, do you have any difficulty performing the following activities:  Hearing? 0  Vision? 0  Difficulty concentrating or making decisions? 0  Walking or climbing stairs? 0  Dressing or bathing? 0  Doing errands, shopping? 0  Preparing Food and eating ? N  Using the Toilet? N  In the past six months, have you accidently leaked urine? N  Do you have problems with loss of bowel control? N  Managing your Medications? N  Managing your Finances? N  Housekeeping or managing your Housekeeping? N    Patient Care Team: Charlott Rakes, MD as PCP - General (Family Medicine)  Indicate any recent Medical Services you may have received from other than [facility] providers in the past year (date may be approximate).     Assessment:   This is a routine wellness examination for Andrea Brown.  Hearing/Vision screen No results found.  Dietary issues and exercise activities discussed: Current Exercise Habits: Home exercise routine, Type of exercise:  walking, Time (Minutes): 30, Frequency (Times/Week): 3, Weekly Exercise (Minutes/Week): 90, Intensity: Mild, Exercise limited by: None identified   Goals Addressed   None    Depression Screen    07/18/2022   10:32 AM 01/19/2022    9:20 AM 05/24/2020   10:02 AM 03/23/2020    9:23 AM 01/23/2019    9:41 AM 01/23/2018    9:26 AM 10/17/2017    8:51 AM  PHQ 2/9 Scores  PHQ - 2 Score 0 0 0 0 0  0  PHQ- 9 Score  0  0 0    Exception Documentation      Patient refusal  Fall Risk    07/18/2022   10:32 AM 01/19/2022    9:20 AM 05/24/2020   10:02 AM 03/23/2020    9:18 AM 09/10/2019    8:33 AM  Fall Risk   Falls in the past year? 0 0 0 0 0  Number falls in past yr: 0 0     Injury with Fall? 0 0     Risk for fall due to :  No Fall Risks No Fall Risks      FALL RISK PREVENTION PERTAINING TO THE HOME:  Any stairs in or around the home? No  If so, are there any without handrails? No  Home free of loose throw rugs in walkways, pet beds, electrical cords, etc? No  Adequate lighting in your home to reduce risk of falls? Yes   ASSISTIVE DEVICES UTILIZED TO PREVENT FALLS:  Life alert? No  Use of a cane, walker or w/c? No  Grab bars in the bathroom? No  Shower chair or bench in shower? No  Elevated toilet seat or a handicapped toilet? No   TIMED UP AND GO:  Was the test performed? No .  Length of time to ambulate 10 feet: 0 sec.   Gait steady and fast without use of assistive device  Cognitive Function:    05/24/2020   10:14 AM  MMSE - Mini Mental State Exam  Orientation to time 5  Orientation to Place 5  Registration 3  Attention/ Calculation 5  Recall 3  Language- name 2 objects 2  Language- repeat 1  Language- follow 3 step command 3  Language- read & follow direction 1  Write a sentence 1  Copy design 1  Total score 30        07/18/2022   10:33 AM  6CIT Screen  What Year? 0 points  What month? 0 points  What time? 0 points  Count back from 20 0 points  Months in  reverse 0 points  Repeat phrase 0 points  Total Score 0 points    Immunizations Immunization History  Administered Date(s) Administered   PFIZER(Purple Top)SARS-COV-2 Vaccination 03/25/2020, 04/19/2020, 12/21/2021   Pneumococcal Conjugate-13 04/22/2018   Pneumococcal Polysaccharide-23 05/24/2020   Tdap 05/24/2020    TDAP status: Up to date  Flu Vaccine status: Declined, Education has been provided regarding the importance of this vaccine but patient still declined. Advised may receive this vaccine at local pharmacy or Health Dept. Aware to provide a copy of the vaccination record if obtained from local pharmacy or Health Dept. Verbalized acceptance and understanding.  Pneumococcal vaccine status: Up to date  Covid-19 vaccine status: Completed vaccines  Qualifies for Shingles Vaccine? No   Zostavax completed No   Shingrix Completed?: No.    Education has been provided regarding the importance of this vaccine. Patient has been advised to call insurance company to determine out of pocket expense if they have not yet received this vaccine. Advised may also receive vaccine at local pharmacy or Health Dept. Verbalized acceptance and understanding.  Screening Tests Health Maintenance  Topic Date Due   Zoster Vaccines- Shingrix (1 of 2) Never done   DEXA SCAN  Never done   FOOT EXAM  03/23/2021   URINE MICROALBUMIN  03/23/2021   COVID-19 Vaccine (4 - Pfizer risk series) 02/15/2022   INFLUENZA VACCINE  07/04/2022   HEMOGLOBIN A1C  07/19/2022   OPHTHALMOLOGY EXAM  08/17/2022   MAMMOGRAM  03/17/2024   COLONOSCOPY (Pts 45-65yr Insurance coverage will need to be confirmed)  08/19/2024   TETANUS/TDAP  05/24/2030   Pneumonia Vaccine 73+ Years old  Completed   Hepatitis C Screening  Completed   HPV VACCINES  Aged Out    Health Maintenance  Health Maintenance Due  Topic Date Due   Zoster Vaccines- Shingrix (1 of 2) Never done   DEXA SCAN  Never done   FOOT EXAM  03/23/2021   URINE  MICROALBUMIN  03/23/2021   COVID-19 Vaccine (4 - Pfizer risk series) 02/15/2022   INFLUENZA VACCINE  07/04/2022    Colorectal cancer screening: Type of screening: Colonoscopy. Completed 08/19/2014. Repeat every 10 years  Mammogram status: Completed 03/17/2022. Repeat every year  Bone Density status: Ordered 07/18/2022. Pt provided with contact info and advised to call to schedule appt.  Lung Cancer Screening: (Low Dose CT Chest recommended if Age 55-80 years, 30 pack-year currently smoking OR have quit w/in 15years.) does qualify.   Lung Cancer Screening Referral: yes  Additional Screening:  Hepatitis C Screening: does qualify; Completed 04/21/2018  Vision Screening: Recommended annual ophthalmology exams for early detection of glaucoma and other disorders of the eye. Is the patient up to date with their annual eye exam?  Yes  Who is the provider or what is the name of the office in which the patient attends annual eye exams? N/A If pt is not established with a provider, would they like to be referred to a provider to establish care? No .   Dental Screening: Recommended annual dental exams for proper oral hygiene  Community Resource Referral / Chronic Care Management: CRR required this visit?  No   CCM required this visit?  No      Plan:     I have personally reviewed and noted the following in the patient's chart:   Medical and social history Use of alcohol, tobacco or illicit drugs  Current medications and supplements including opioid prescriptions.  Functional ability and status Nutritional status Physical activity Advanced directives List of other physicians Hospitalizations, surgeries, and ER visits in previous 12 months Vitals Screenings to include cognitive, depression, and falls Referrals and appointments  In addition, I have reviewed and discussed with patient certain preventive protocols, quality metrics, and best practice recommendations. A written  personalized care plan for preventive services as well as general preventive health recommendations were provided to patient.     Gomez Cleverly, [place]   07/18/2022   Nurse Notes: I spent 60 minutes on this telephone encounter

## 2022-07-18 NOTE — Patient Instructions (Signed)

## 2022-07-20 ENCOUNTER — Ambulatory Visit: Payer: Medicare HMO | Attending: Family Medicine | Admitting: Family Medicine

## 2022-07-20 ENCOUNTER — Encounter: Payer: Self-pay | Admitting: Family Medicine

## 2022-07-20 VITALS — BP 121/68 | HR 71 | Ht 61.0 in | Wt 141.4 lb

## 2022-07-20 DIAGNOSIS — I129 Hypertensive chronic kidney disease with stage 1 through stage 4 chronic kidney disease, or unspecified chronic kidney disease: Secondary | ICD-10-CM

## 2022-07-20 DIAGNOSIS — N184 Chronic kidney disease, stage 4 (severe): Secondary | ICD-10-CM

## 2022-07-20 DIAGNOSIS — E2839 Other primary ovarian failure: Secondary | ICD-10-CM

## 2022-07-20 DIAGNOSIS — E1122 Type 2 diabetes mellitus with diabetic chronic kidney disease: Secondary | ICD-10-CM | POA: Diagnosis not present

## 2022-07-20 DIAGNOSIS — E1121 Type 2 diabetes mellitus with diabetic nephropathy: Secondary | ICD-10-CM | POA: Diagnosis not present

## 2022-07-20 DIAGNOSIS — E785 Hyperlipidemia, unspecified: Secondary | ICD-10-CM | POA: Diagnosis not present

## 2022-07-20 LAB — POCT GLYCOSYLATED HEMOGLOBIN (HGB A1C): HbA1c, POC (controlled diabetic range): 5.9 % (ref 0.0–7.0)

## 2022-07-20 MED ORDER — SIMVASTATIN 10 MG PO TABS: ORAL_TABLET | ORAL | 1 refills | Status: DC

## 2022-07-20 MED ORDER — TRULICITY 0.75 MG/0.5ML ~~LOC~~ SOAJ: 0.7500 mg | SUBCUTANEOUS | 5 refills | Status: DC

## 2022-07-20 MED ORDER — ZOSTER VAC RECOMB ADJUVANTED 50 MCG/0.5ML IM SUSR: 0.5000 mL | Freq: Once | INTRAMUSCULAR | 1 refills | Status: AC

## 2022-07-20 MED ORDER — ISOSORBIDE MONONITRATE ER 60 MG PO TB24: 60.0000 mg | ORAL_TABLET | Freq: Every day | ORAL | 1 refills | Status: DC

## 2022-07-20 MED ORDER — AMLODIPINE BESYLATE 10 MG PO TABS: 10.0000 mg | ORAL_TABLET | Freq: Every day | ORAL | 1 refills | Status: DC

## 2022-07-20 MED ORDER — GLIPIZIDE 10 MG PO TABS: 10.0000 mg | ORAL_TABLET | Freq: Two times a day (BID) | ORAL | 1 refills | Status: DC

## 2022-07-20 MED ORDER — HYDROCHLOROTHIAZIDE 25 MG PO TABS: 25.0000 mg | ORAL_TABLET | Freq: Every day | ORAL | 1 refills | Status: DC

## 2022-07-20 NOTE — Progress Notes (Signed)
Subjective:  Patient ID: Andrea Brown, female    DOB: [birth year]  Age: 71 y.o. MRN: [id number]  CC: Diabetes   HPI Andrea Brown is a 71 y.o. year old female with a history of Diabetes Mellitus Type 2 (A1c 5.9), Hypertension, Hyperlipidemia, CKD stage 4 and tobacco use who comes into the office today for 6 month  follow up   Interval History: Currently under the care of [facility]. She saw Dr Moshe Cipro this year.  She has no hypoglycemia and fasting sugars are 125-140 on some occasions 99. She has no paresthesia, visual concerns.  Endorses adherence with Trulicity and glipizide. Doing well on her antihypertensive and statin. Denies additional concerns today. 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   Diabetes mellitus without complication (HCC)    Hyperlipidemia    Hypertension    Papilloma of right breast    Smoker     Past Surgical History:  Procedure Laterality Date   BREAST EXCISIONAL BIOPSY     BREAST LUMPECTOMY WITH RADIOACTIVE SEED LOCALIZATION Right 02/23/2020   Procedure: RIGHT BREAST LUMPECTOMY X 3 WITH RADIOACTIVE SEED LOCALIZATION;  Surgeon: Jovita Kussmaul, MD;  Location: [place];  Service: General;  Laterality: Right;   TOTAL ABDOMINAL HYSTERECTOMY  2000    Family History  Problem Relation Age of Onset   Diabetes Mother    Hypertension Mother    Colon cancer Neg Hx    Esophageal cancer Neg Hx    Pancreatic cancer Neg Hx    Rectal cancer Neg Hx    Stomach cancer Neg Hx     Social History   Socioeconomic History   Marital status: Single    Spouse name: Not on file   Number of children: Not on file   Years of education: Not on file   Highest education level: Not on file  Occupational History   Not on file  Tobacco Use   Smoking status: Every Day    Packs/day: 0.25    Types: Cigarettes   Smokeless tobacco: Never   Tobacco comments:    patient is trying EOD   Substance and Sexual Activity   Alcohol use: No   Drug use: No   Sexual activity: Not Currently    Birth control/protection: Surgical  Other Topics Concern   Not on file  Social History Narrative   Not on file   Social Determinants of Health   Financial Resource Strain: Not on file  Food Insecurity: Not on file  Transportation Needs: Not on file  Physical Activity: Not on file  Stress: Not on file  Social Connections: Not on file    No Known Allergies  Outpatient Medications Prior to Visit  Medication Sig Dispense Refill   aspirin 81 MG tablet Take 1 tablet (81 mg total) daily by mouth. 30 tablet 5   Blood Glucose Monitoring Suppl (ACCU-CHEK GUIDE ME) w/Device KIT 1 each by Does not apply route 3 (three) times daily before meals. 1 kit 0   glucose blood (ACCU-CHEK GUIDE) test strip Use as instructed 1 to 2 times per day. E11.21 50 each 11   Misc. Devices MISC Blood pressure monitor; diagnosis-hypertension 1 each 0   oxyCODONE (OXY IR/ROXICODONE) 5 MG immediate release tablet Take 1-2 tablets (5-10 mg total) by mouth every 6 (six) hours as needed for moderate pain, severe pain or breakthrough pain. 10 tablet 0   amLODipine (NORVASC) 10  MG tablet TAKE 1 TABLET EVERY DAY 90 tablet 0   Dulaglutide (TRULICITY) 9.16 BW/4.6KZ SOPN Inject 0.75 mg into the skin once a week. 6 mL 5   glipiZIDE (GLUCOTROL) 10 MG tablet Take 1 tablet (10 mg total) by mouth 2 (two) times daily before a meal. 180 tablet 1   hydrochlorothiazide (HYDRODIURIL) 25 MG tablet Take 1 tablet (25 mg total) by mouth daily. 90 tablet 1   isosorbide mononitrate (IMDUR) 60 MG 24 hr tablet Take 1 tablet (60 mg total) by mouth daily. 90 tablet 1   simvastatin (ZOCOR) 10 MG tablet 1 at bedtime 90 tablet 1   No facility-administered medications prior to visit.     ROS Review of Systems  Constitutional:  Negative for activity change and appetite change.  HENT:  Negative for sinus pressure and sore throat.    Respiratory:  Negative for chest tightness, shortness of breath and wheezing.   Cardiovascular:  Negative for chest pain and palpitations.  Gastrointestinal:  Negative for abdominal distention, abdominal pain and constipation.  Genitourinary: Negative.   Musculoskeletal: Negative.   Psychiatric/Behavioral:  Negative for behavioral problems and dysphoric mood.     Objective:  BP 121/68   Pulse 71   Ht _0  (1.549 m)   Wt 141 lb 6.4 oz (64.1 kg)   SpO2 97%   BMI 26.72 kg/m      07/20/2022    8:46 AM 01/19/2022    9:01 AM 07/13/2021    8:56 AM  BP/Weight  Systolic BP 993 570 177  Diastolic BP 68 74 68  Wt. (Lbs) 141.4 147.38 145  BMI 26.72 kg/m2 27.85 kg/m2 27.4 kg/m2      Physical Exam Constitutional:      Appearance: She is well-developed.  Cardiovascular:     Rate and Rhythm: Normal rate.     Heart sounds: Normal heart sounds. No murmur heard. Pulmonary:     Effort: Pulmonary effort is normal.     Breath sounds: Normal breath sounds. No wheezing or rales.  Chest:     Chest wall: No tenderness.  Abdominal:     General: Bowel sounds are normal. There is no distension.     Palpations: Abdomen is soft. There is no mass.     Tenderness: There is no abdominal tenderness.  Musculoskeletal:        General: Normal range of motion.     Right lower leg: No edema.     Left lower leg: No edema.  Neurological:     Mental Status: She is alert and oriented to person, place, and time.  Psychiatric:        Mood and Affect: Mood normal.    Diabetic Foot Exam - Simple   Simple Foot Form Diabetic Foot exam was performed with the following findings: Yes 07/20/2022  9:00 AM  Visual Inspection No deformities, no ulcerations, no other skin breakdown bilaterally: Yes Sensation Testing Intact to touch and monofilament testing bilaterally: Yes Pulse Check Posterior Tibialis and Dorsalis pulse intact bilaterally: Yes Comments        Latest Ref Rng & Units 01/19/2022    9:33 AM  07/13/2021    9:20 AM 12/28/2020   10:32 AM  CMP  Glucose 70 - 99 mg/dL 129  68  118   BUN 8 - 27 mg/dL 32  18  42   Creatinine 0.57 - 1.00 mg/dL 1.90  1.68  1.88   Sodium 134 - 144 mmol/L 144  144  143  Potassium 3.5 - 5.2 mmol/L 4.5  4.2  4.2   Chloride 96 - 106 mmol/L 106  105  105   CO2 20 - 29 mmol/L _0 Calcium 8.7 - 10.3 mg/dL 9.6  9.5  9.6   Total Protein 6.0 - 8.5 g/dL 7.0  6.8  7.0   Total Bilirubin 0.0 - 1.2 mg/dL 0.2  <0.2  0.3   Alkaline Phos 44 - 121 IU/L 69  62  67   AST 0 - 40 IU/L _1 ALT 0 - 32 IU/L _2 Lipid Panel     Component Value Date/Time   CHOL 133 07/13/2021 0920   TRIG 53 07/13/2021 0920   HDL 63 07/13/2021 0920   CHOLHDL 2.1 07/13/2021 0920   CHOLHDL 3.7 10/05/2016 1048   VLDL 19 10/05/2016 1048   LDLCALC 58 07/13/2021 0920   LDLDIRECT 40 09/09/2009 2145    CBC    Component Value Date/Time   WBC 5.7 01/19/2022 0933   WBC 5.5 10/05/2016 1048   RBC 3.88 01/19/2022 0933   RBC 3.97 10/05/2016 1048   HGB 11.1 01/19/2022 0933   HCT 34.0 01/19/2022 0933   PLT 225 01/19/2022 0933   MCV 88 01/19/2022 0933   MCH 28.6 01/19/2022 0933   MCH 28.2 10/05/2016 1048   MCHC 32.6 01/19/2022 0933   MCHC 32.7 10/05/2016 1048   RDW 14.5 01/19/2022 0933   LYMPHSABS 2.0 01/19/2022 0933   MONOABS 275 10/05/2016 1048   EOSABS 0.1 01/19/2022 0933   BASOSABS 0.0 01/19/2022 0933    Lab Results  Component Value Date   HGBA1C 5.9 07/20/2022    Assessment & Plan:  1. Type 2 diabetes mellitus with diabetic nephropathy, without long-term current use of insulin (HCC) Controlled with A1c of 5.9 She denies hypoglycemic episodes but has been counseled that if she notices this she can decrease glipizide from 10 mg to 5 mg twice daily Counseled on Diabetic diet, my plate method, 754 minutes of moderate intensity exercise/week Blood sugar logs with fasting goals of 80-120 mg/dl, random of less than 180 and in the event of sugars less than  60 mg/dl or greater than 400 mg/dl encouraged to notify the clinic. Advised on the need for annual eye exams, annual foot exams, Pneumonia vaccine. - POCT glycosylated hemoglobin (Hb A1C) - Microalbumin / creatinine urine ratio - LP+Non-HDL Cholesterol - CMP14+EGFR - glipiZIDE (GLUCOTROL) 10 MG tablet; Take 1 tablet (10 mg total) by mouth 2 (two) times daily before a meal.  Dispense: 180 tablet; Refill: 1 - Dulaglutide (TRULICITY) 3.60 OV/7.0HE SOPN; Inject 0.75 mg into the skin once a week.  Dispense: 6 mL; Refill: 5  2.  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 Discussed medication compliance, adverse effects. - amLODipine (NORVASC) 10 MG tablet; Take 1 tablet (10 mg total) by mouth daily.  Dispense: 90 tablet; Refill: 1 - hydrochlorothiazide (HYDRODIURIL) 25 MG tablet; Take 1 tablet (25 mg total) by mouth daily.  Dispense: 90 tablet; Refill: 1 - isosorbide mononitrate (IMDUR) 60 MG 24 hr tablet; Take 1 tablet (60 mg total) by mouth daily.  Dispense: 90 tablet; Refill: 1  3. Dyslipidemia Controlled Low-cholesterol diet - simvastatin (  ZOCOR) 10 MG tablet; 1 at bedtime  Dispense: 90 tablet; Refill: 1  4. Estrogen deficiency - DG Bone Density; Future    Meds ordered this encounter  Medications   Zoster Vaccine Adjuvanted [facility]) injection    Sig: Inject 0.5 mLs into the muscle once for 1 dose.    Dispense:  0.5 mL    Refill:  1    Give 2nd dose in 2 months.   amLODipine (NORVASC) 10 MG tablet    Sig: Take 1 tablet (10 mg total) by mouth daily.    Dispense:  90 tablet    Refill:  1   glipiZIDE (GLUCOTROL) 10 MG tablet    Sig: Take 1 tablet (10 mg total) by mouth 2 (two) times daily before a meal.     Dispense:  180 tablet    Refill:  1   hydrochlorothiazide (HYDRODIURIL) 25 MG tablet    Sig: Take 1 tablet (25 mg total) by mouth daily.    Dispense:  90 tablet    Refill:  1   isosorbide mononitrate (IMDUR) 60 MG 24 hr tablet    Sig: Take 1 tablet (60 mg total) by mouth daily.    Dispense:  90 tablet    Refill:  1   simvastatin (ZOCOR) 10 MG tablet    Sig: 1 at bedtime    Dispense:  90 tablet    Refill:  1   Dulaglutide (TRULICITY) 5.42 LT/0.2XW SOPN    Sig: Inject 0.75 mg into the skin once a week.    Dispense:  6 mL    Refill:  5    Follow-up: Return in about 6 months (around 01/20/2023) for Chronic medical conditions.       Charlott Rakes, MD, FAAFP. [facility] and [place], [place]   07/20/2022, 9:01 AM

## 2022-07-20 NOTE — Patient Instructions (Signed)
Diabetes Mellitus and Foot Care Foot care is an important part of your health, especially when you have diabetes. Diabetes may cause you to have problems because of poor blood flow (circulation) to your feet and legs, which can cause your skin to: Become thinner and drier. Break more easily. Heal more slowly. Peel and crack. You may also have nerve damage (neuropathy) in your legs and feet, causing decreased feeling in them. This means that you may not notice minor injuries to your feet that could lead to more serious problems. Noticing and addressing any potential problems early is the best way to prevent future foot problems. How to care for your feet Foot hygiene  Wash your feet daily with warm water and mild soap. Do not use hot water. Then, pat your feet and the areas between your toes until they are completely dry. Do not soak your feet as this can dry your skin. Trim your toenails straight across. Do not dig under them or around the cuticle. File the edges of your nails with an emery board or nail file. Apply a moisturizing lotion or petroleum jelly to the skin on your feet and to dry, brittle toenails. Use lotion that does not contain alcohol and is unscented. Do not apply lotion between your toes. Shoes and socks Wear clean socks or stockings every day. Make sure they are not too tight. Do not wear knee-high stockings since they may decrease blood flow to your legs. Wear shoes that fit properly and have enough cushioning. Always look in your shoes before you put them on to be sure there are no objects inside. To break in new shoes, wear them for just a few hours a day. This prevents injuries on your feet. Wounds, scrapes, corns, and calluses  Check your feet daily for blisters, cuts, bruises, sores, and redness. If you cannot see the bottom of your feet, use a mirror or ask someone for help. Do not cut corns or calluses or try to remove them with medicine. If you find a minor scrape,  cut, or break in the skin on your feet, keep it and the skin around it clean and dry. You may clean these areas with mild soap and water. Do not clean the area with peroxide, alcohol, or iodine. If you have a wound, scrape, corn, or callus on your foot, look at it several times a day to make sure it is healing and not infected. Check for: Redness, swelling, or pain. Fluid or blood. Warmth. Pus or a bad smell. General tips Do not cross your legs. This may decrease blood flow to your feet. Do not use heating pads or hot water bottles on your feet. They may burn your skin. If you have lost feeling in your feet or legs, you may not know this is happening until it is too late. Protect your feet from hot and cold by wearing shoes, such as at the beach or on hot pavement. Schedule a complete foot exam at least once a year (annually) or more often if you have foot problems. Report any cuts, sores, or bruises to your health care provider immediately. Where to find more information American Diabetes Association: www.diabetes.org Association of Diabetes Care & Education Specialists: www.diabeteseducator.org Contact a health care provider if: You have a medical condition that increases your risk of infection and you have any cuts, sores, or bruises on your feet. You have an injury that is not healing. You have redness on your legs or feet. You   feel burning or tingling in your legs or feet. You have pain or cramps in your legs and feet. Your legs or feet are numb. Your feet always feel cold. You have pain around any toenails. Get help right away if: You have a wound, scrape, corn, or callus on your foot and: You have pain, swelling, or redness that gets worse. You have fluid or blood coming from the wound, scrape, corn, or callus. Your wound, scrape, corn, or callus feels warm to the touch. You have pus or a bad smell coming from the wound, scrape, corn, or callus. You have a fever. You have a red  line going up your leg. Summary Check your feet every day for blisters, cuts, bruises, sores, and redness. Apply a moisturizing lotion or petroleum jelly to the skin on your feet and to dry, brittle toenails. Wear shoes that fit properly and have enough cushioning. If you have foot problems, report any cuts, sores, or bruises to your health care provider immediately. Schedule a complete foot exam at least once a year (annually) or more often if you have foot problems. This information is not intended to replace advice given to you by your health care provider. Make sure you discuss any questions you have with your health care provider. Document Revised: 06/10/2020 Document Reviewed: 06/10/2020 Elsevier Patient Education  2023 Elsevier Inc.  

## 2022-07-20 NOTE — Progress Notes (Signed)
Needs bone density scan Lung cancer screening.

## 2022-07-22 LAB — CMP14+EGFR
ALT: 7 IU/L (ref 0–32)
AST: 17 IU/L (ref 0–40)
Albumin/Globulin Ratio: 1.8 (ref 1.2–2.2)
Albumin: 4.8 g/dL (ref 3.8–4.8)
Alkaline Phosphatase: 60 IU/L (ref 44–121)
BUN/Creatinine Ratio: 16 (ref 12–28)
BUN: 35 mg/dL — ABNORMAL HIGH (ref 8–27)
Bilirubin Total: 0.3 mg/dL (ref 0.0–1.2)
CO2: 23 mmol/L (ref 20–29)
Calcium: 9.6 mg/dL (ref 8.7–10.3)
Chloride: 102 mmol/L (ref 96–106)
Creatinine, Ser: 2.17 mg/dL — ABNORMAL HIGH (ref 0.57–1.00)
Globulin, Total: 2.7 g/dL (ref 1.5–4.5)
Glucose: 97 mg/dL (ref 70–99)
Potassium: 4.3 mmol/L (ref 3.5–5.2)
Sodium: 142 mmol/L (ref 134–144)
Total Protein: 7.5 g/dL (ref 6.0–8.5)
eGFR: 23 mL/min/{1.73_m2} — ABNORMAL LOW (ref >59)

## 2022-07-22 LAB — MICROALBUMIN / CREATININE URINE RATIO
Creatinine, Urine: 58.5 mg/dL
Microalb/Creat Ratio: 32 mg/g creat — ABNORMAL HIGH (ref 0–29)
Microalbumin, Urine: 18.7 ug/mL

## 2022-07-22 LAB — LP+NON-HDL CHOLESTEROL
Cholesterol, Total: 169 mg/dL (ref 100–199)
HDL: 59 mg/dL (ref >39)
LDL Chol Calc (NIH): 98 mg/dL (ref 0–99)
Total Non-HDL-Chol (LDL+VLDL): 110 mg/dL (ref 0–129)
Triglycerides: 62 mg/dL (ref 0–149)
VLDL Cholesterol Cal: 12 mg/dL (ref 5–40)

## 2022-08-21 ENCOUNTER — Other Ambulatory Visit: Payer: Self-pay

## 2022-08-21 DIAGNOSIS — E1121 Type 2 diabetes mellitus with diabetic nephropathy: Secondary | ICD-10-CM

## 2022-08-21 MED ORDER — ACCU-CHEK SOFTCLIX LANCETS MISC: 12 refills | Status: DC

## 2022-08-21 MED ORDER — ACCU-CHEK GUIDE VI STRP: ORAL_STRIP | 11 refills | Status: DC

## 2022-08-21 MED ORDER — ACCU-CHEK GUIDE ME W/DEVICE KIT: 1.0000 | PACK | Freq: Three times a day (TID) | 0 refills | Status: AC

## 2022-10-17 DIAGNOSIS — N184 Chronic kidney disease, stage 4 (severe): Secondary | ICD-10-CM | POA: Diagnosis not present

## 2022-10-17 DIAGNOSIS — F17200 Nicotine dependence, unspecified, uncomplicated: Secondary | ICD-10-CM | POA: Diagnosis not present

## 2022-10-17 DIAGNOSIS — E1122 Type 2 diabetes mellitus with diabetic chronic kidney disease: Secondary | ICD-10-CM | POA: Diagnosis not present

## 2022-10-17 DIAGNOSIS — I129 Hypertensive chronic kidney disease with stage 1 through stage 4 chronic kidney disease, or unspecified chronic kidney disease: Secondary | ICD-10-CM | POA: Diagnosis not present

## 2022-10-18 DIAGNOSIS — N184 Chronic kidney disease, stage 4 (severe): Secondary | ICD-10-CM | POA: Diagnosis not present

## 2022-10-18 DIAGNOSIS — N390 Urinary tract infection, site not specified: Secondary | ICD-10-CM | POA: Diagnosis not present

## 2023-01-23 ENCOUNTER — Encounter: Payer: Self-pay | Admitting: Family Medicine

## 2023-01-23 ENCOUNTER — Ambulatory Visit: Payer: Medicare HMO | Attending: Family Medicine | Admitting: Family Medicine

## 2023-01-23 VITALS — BP 135/65 | HR 71 | Ht 61.0 in | Wt 144.0 lb

## 2023-01-23 DIAGNOSIS — N184 Chronic kidney disease, stage 4 (severe): Secondary | ICD-10-CM

## 2023-01-23 DIAGNOSIS — I129 Hypertensive chronic kidney disease with stage 1 through stage 4 chronic kidney disease, or unspecified chronic kidney disease: Secondary | ICD-10-CM | POA: Diagnosis not present

## 2023-01-23 DIAGNOSIS — E785 Hyperlipidemia, unspecified: Secondary | ICD-10-CM

## 2023-01-23 DIAGNOSIS — E1121 Type 2 diabetes mellitus with diabetic nephropathy: Secondary | ICD-10-CM | POA: Diagnosis not present

## 2023-01-23 DIAGNOSIS — E1122 Type 2 diabetes mellitus with diabetic chronic kidney disease: Secondary | ICD-10-CM

## 2023-01-23 LAB — POCT GLYCOSYLATED HEMOGLOBIN (HGB A1C): HbA1c, POC (controlled diabetic range): 6 % (ref 0.0–7.0)

## 2023-01-23 NOTE — Patient Instructions (Signed)

## 2023-01-23 NOTE — Progress Notes (Signed)
Subjective:  Patient ID: Andrea Brown, female    DOB: [birth year]  Age: 72 y.o. MRN: [id number]  CC: Diabetes   HPI Luul Torelli is a 72 y.o. year old female with a history of Diabetes Mellitus Type 2 (A1c 5.9), Hypertension, Hyperlipidemia, CKD stage 4 and tobacco use who comes into the office today for 6 month  follow up   Interval History:  She still smokes 1 pack of Cigarettes over 2.5 days for over 40 years.  Not ready to quit.  She states she has not smoked up to 20 pack years. With regards to her diabetes she has no hypoglycemia, neuropathy or visual concerns. Endorses adherence with her antihypertensive or statin Her eye exams are done by [facility] yearly at her home and she is yet to have one done this year. She tries to walk on days when it is warm.She also walks to catch the bus. Closely followed by [facility] for management of her chronic kidney disease. She is good on all her refills.  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   Diabetes mellitus without complication (HCC)    Hyperlipidemia    Hypertension    Papilloma of right breast    Smoker     Past Surgical History:  Procedure Laterality Date   BREAST EXCISIONAL BIOPSY     BREAST LUMPECTOMY WITH RADIOACTIVE SEED LOCALIZATION Right 02/23/2020   Procedure: RIGHT BREAST LUMPECTOMY X 3 WITH RADIOACTIVE SEED LOCALIZATION;  Surgeon: Jovita Kussmaul, MD;  Location: [place];  Service: General;  Laterality: Right;   TOTAL ABDOMINAL HYSTERECTOMY  2000    Family History  Problem Relation Age of Onset   Diabetes Mother    Hypertension Mother    Colon cancer Neg Hx    Esophageal cancer Neg Hx    Pancreatic cancer Neg Hx    Rectal cancer Neg Hx    Stomach cancer Neg Hx     Social History   Socioeconomic History   Marital status: Single    Spouse name: Not on file   Number of children: Not on file   Years of education: Not on  file   Highest education level: Not on file  Occupational History   Not on file  Tobacco Use   Smoking status: Every Day    Packs/day: 0.25    Types: Cigarettes   Smokeless tobacco: Never   Tobacco comments:    patient is trying EOD  Substance and Sexual Activity   Alcohol use: No   Drug use: No   Sexual activity: Not Currently    Birth control/protection: Surgical  Other Topics Concern   Not on file  Social History Narrative   Not on file   Social Determinants of Health   Financial Resource Strain: Not on file  Food Insecurity: Not on file  Transportation Needs: Not on file  Physical Activity: Not on file  Stress: Not on file  Social Connections: Not on file    No Known Allergies  Outpatient Medications Prior to Visit  Medication Sig Dispense Refill   Accu-Chek Softclix Lancets lancets Use as instructed tid before meals 100 each 12   amLODipine (NORVASC) 10 MG tablet Take 1 tablet (10 mg total) by mouth daily. 90 tablet 1   aspirin 81 MG tablet Take 1 tablet (81 mg total) daily by mouth. 30 tablet 5   Blood Glucose Monitoring Suppl (ACCU-CHEK GUIDE  ME) w/Device KIT 1 each by Does not apply route 3 (three) times daily before meals. 1 kit 0   Dulaglutide (TRULICITY) A999333 0000000 SOPN Inject 0.75 mg into the skin once a week. 6 mL 5   glipiZIDE (GLUCOTROL) 10 MG tablet Take 1 tablet (10 mg total) by mouth 2 (two) times daily before a meal. 180 tablet 1   glucose blood (ACCU-CHEK GUIDE) test strip Use as instructed 1 to 2 times per day. E11.21 50 each 11   hydrochlorothiazide (HYDRODIURIL) 25 MG tablet Take 1 tablet (25 mg total) by mouth daily. 90 tablet 1   isosorbide mononitrate (IMDUR) 60 MG 24 hr tablet Take 1 tablet (60 mg total) by mouth daily. 90 tablet 1   Misc. Devices MISC Blood pressure monitor; diagnosis-hypertension 1 each 0   simvastatin (ZOCOR) 10 MG tablet 1 at bedtime 90 tablet 1   oxyCODONE (OXY IR/ROXICODONE) 5 MG immediate release tablet Take 1-2  tablets (5-10 mg total) by mouth every 6 (six) hours as needed for moderate pain, severe pain or breakthrough pain. (Patient not taking: Reported on 01/23/2023) 10 tablet 0   No facility-administered medications prior to visit.     ROS Review of Systems  Constitutional:  Negative for activity change and appetite change.  HENT:  Negative for sinus pressure and sore throat.   Respiratory:  Negative for chest tightness, shortness of breath and wheezing.   Cardiovascular:  Negative for chest pain and palpitations.  Gastrointestinal:  Negative for abdominal distention, abdominal pain and constipation.  Genitourinary: Negative.   Musculoskeletal: Negative.   Psychiatric/Behavioral:  Negative for behavioral problems and dysphoric mood.     Objective:  BP 135/65   Pulse 71   Ht 5' 1"$  (1.549 m)   Wt 144 lb (65.3 kg)   SpO2 97%   BMI 27.21 kg/m      01/23/2023    8:38 AM 07/20/2022    8:46 AM 01/19/2022    9:01 AM  BP/Weight  Systolic BP [id number] [id number]  Diastolic BP 65 68 74  Wt. (Lbs) 144 141.4 147.38  BMI 27.21 kg/m2 26.72 kg/m2 27.85 kg/m2      Physical Exam Constitutional:      Appearance: She is well-developed.  Cardiovascular:     Rate and Rhythm: Normal rate.     Heart sounds: Normal heart sounds. No murmur heard. Pulmonary:     Effort: Pulmonary effort is normal.     Breath sounds: Normal breath sounds. No wheezing or rales.  Chest:     Chest wall: No tenderness.  Abdominal:     General: Bowel sounds are normal. There is no distension.     Palpations: Abdomen is soft. There is no mass.     Tenderness: There is no abdominal tenderness.  Musculoskeletal:        General: Normal range of motion.     Right lower leg: No edema.     Left lower leg: No edema.  Neurological:     Mental Status: She is alert and oriented to person, place, and time.  Psychiatric:        Mood and Affect: Mood normal.        Latest Ref Rng & Units 07/20/2022    9:15 AM 01/19/2022    9:33  AM 07/13/2021    9:20 AM  CMP  Glucose 70 - 99 mg/dL 97  129  68   BUN 8 - 27 mg/dL 35  32  18   Creatinine 0.57 -  1.00 mg/dL 2.17  1.90  1.68   Sodium 134 - 144 mmol/L 142  144  144   Potassium 3.5 - 5.2 mmol/L 4.3  4.5  4.2   Chloride 96 - 106 mmol/L 102  106  105   CO2 20 - 29 mmol/L 23  22  22   $ Calcium 8.7 - 10.3 mg/dL 9.6  9.6  9.5   Total Protein 6.0 - 8.5 g/dL 7.5  7.0  6.8   Total Bilirubin 0.0 - 1.2 mg/dL 0.3  0.2  <0.2   Alkaline Phos 44 - 121 IU/L 60  69  62   AST 0 - 40 IU/L 17  20  16   $ ALT 0 - 32 IU/L 7  11  11     $ Lipid Panel     Component Value Date/Time   CHOL 169 07/20/2022 0915   TRIG 62 07/20/2022 0915   HDL 59 07/20/2022 0915   CHOLHDL 2.1 07/13/2021 0920   CHOLHDL 3.7 10/05/2016 1048   VLDL 19 10/05/2016 1048   LDLCALC 98 07/20/2022 0915   LDLDIRECT 40 09/09/2009 2145    CBC    Component Value Date/Time   WBC 5.7 01/19/2022 0933   WBC 5.5 10/05/2016 1048   RBC 3.88 01/19/2022 0933   RBC 3.97 10/05/2016 1048   HGB 11.1 01/19/2022 0933   HCT 34.0 01/19/2022 0933   PLT 225 01/19/2022 0933   MCV 88 01/19/2022 0933   MCH 28.6 01/19/2022 0933   MCH 28.2 10/05/2016 1048   MCHC 32.6 01/19/2022 0933   MCHC 32.7 10/05/2016 1048   RDW 14.5 01/19/2022 0933   LYMPHSABS 2.0 01/19/2022 0933   MONOABS 275 10/05/2016 1048   EOSABS 0.1 01/19/2022 0933   BASOSABS 0.0 01/19/2022 0933    Lab Results  Component Value Date   HGBA1C 6.0 01/23/2023    Assessment & Plan:  1. Type 2 diabetes mellitus with diabetic nephropathy, without long-term current use of insulin (HCC) Controlled with A1c of 6.0 Continue current regimen She does have a combination of hypertensive and diabetic nephropathy and is currently under the care of [facility] Avoid nephrotoxins Counseled on Diabetic diet, my plate method, [id number] minutes of moderate intensity exercise/week Blood sugar logs with fasting goals of 80-120 mg/dl, random of less than 180 and in the event of  sugars less than 60 mg/dl or greater than 400 mg/dl encouraged to notify the clinic. Advised on the need for annual eye exams, annual foot exams, Pneumonia vaccine. - POCT glycosylated hemoglobin (Hb A1C) - CMP14+EGFR  2. Hypertension associated with stage 4 chronic kidney disease due to type 2 diabetes mellitus ([place]) Controlled Continue current regimen Counseled on blood pressure goal of less than 130/80, low-sodium, DASH diet, medication compliance, 150 minutes of moderate intensity exercise per week. Discussed medication compliance, adverse effects.  3. Dyslipidemia Controlled Doing well on a statin Low-cholesterol diet    No orders of the defined types were placed in this encounter.   Follow-up: Return in about 6 months (around 07/24/2023) for Chronic medical conditions.       Charlott Rakes, MD, FAAFP. [facility] and [facility], [place]   01/23/2023, 11:08 AM

## 2023-01-24 LAB — CMP14+EGFR
ALT: 9 IU/L (ref 0–32)
AST: 19 IU/L (ref 0–40)
Albumin/Globulin Ratio: 1.6 (ref 1.2–2.2)
Albumin: 4.5 g/dL (ref 3.8–4.8)
Alkaline Phosphatase: 68 IU/L (ref 44–121)
BUN/Creatinine Ratio: 16 (ref 12–28)
BUN: 33 mg/dL — ABNORMAL HIGH (ref 8–27)
Bilirubin Total: 0.2 mg/dL (ref 0.0–1.2)
CO2: 22 mmol/L (ref 20–29)
Calcium: 9.5 mg/dL (ref 8.7–10.3)
Chloride: 106 mmol/L (ref 96–106)
Creatinine, Ser: 2.11 mg/dL — ABNORMAL HIGH (ref 0.57–1.00)
Globulin, Total: 2.9 g/dL (ref 1.5–4.5)
Glucose: 73 mg/dL (ref 70–99)
Potassium: 4.2 mmol/L (ref 3.5–5.2)
Sodium: 143 mmol/L (ref 134–144)
Total Protein: 7.4 g/dL (ref 6.0–8.5)
eGFR: 24 mL/min/{1.73_m2} — ABNORMAL LOW (ref >59)

## 2023-02-06 ENCOUNTER — Other Ambulatory Visit: Payer: Self-pay | Admitting: Family Medicine

## 2023-02-06 DIAGNOSIS — Z1231 Encounter for screening mammogram for malignant neoplasm of breast: Secondary | ICD-10-CM

## 2023-03-05 ENCOUNTER — Other Ambulatory Visit: Payer: Self-pay | Admitting: Family Medicine

## 2023-03-05 DIAGNOSIS — E785 Hyperlipidemia, unspecified: Secondary | ICD-10-CM

## 2023-03-05 DIAGNOSIS — E1122 Type 2 diabetes mellitus with diabetic chronic kidney disease: Secondary | ICD-10-CM

## 2023-03-05 DIAGNOSIS — E1121 Type 2 diabetes mellitus with diabetic nephropathy: Secondary | ICD-10-CM

## 2023-03-06 NOTE — Telephone Encounter (Signed)
Requested Prescriptions  Pending Prescriptions Disp Refills   amLODipine (NORVASC) 10 MG tablet [Pharmacy Med Name: AMLODIPINE BESYLATE 10 MG Tablet] 90 tablet 0    Sig: TAKE 1 TABLET (10 MG TOTAL) BY MOUTH DAILY.     Cardiovascular: Calcium Channel Blockers 2 Passed - 03/05/2023  2:34 PM      Passed - Last BP in normal range    BP Readings from Last 1 Encounters:  01/23/23 135/65         Passed - Last Heart Rate in normal range    Pulse Readings from Last 1 Encounters:  01/23/23 71         Passed - Valid encounter within last 6 months    Recent Outpatient Visits           1 month ago Type 2 diabetes mellitus with diabetic nephropathy, without long-term current use of insulin ([place])   Brush, [place], MD   7 months ago Type 2 diabetes mellitus with diabetic nephropathy, without long-term current use of insulin ([place])   [place], [place], MD   1 year ago Type 2 diabetes mellitus with diabetic nephropathy, without long-term current use of insulin [facility])   [place], [place], [place]   1 year ago Type 2 diabetes mellitus with diabetic nephropathy, without long-term current use of insulin [facility])   [place], [place], [place]   2 years ago Type 2 diabetes mellitus with diabetic nephropathy, without long-term current use of insulin ([place])   [place], [place], MD       Future Appointments             In 4 months Charlott Rakes, MD [place]             simvastatin ([place]) 10 MG tablet [Pharmacy Med Name: SIMVASTATIN 10 MG Tablet] 90 tablet 0    Sig: TAKE 1 TABLET AT BEDTIME     Cardiovascular:  Antilipid - Statins Failed - 03/05/2023  2:34 PM      Failed - Lipid Panel in normal range within the last 12 months    Cholesterol,  Total  Date Value Ref Range Status  07/20/2022 169 100 - 199 mg/dL Final   LDL Chol Calc (NIH)  Date Value Ref Range Status  07/20/2022 98 0 - 99 mg/dL Final   Direct LDL  Date Value Ref Range Status  09/09/2009 40 mg/dL Final    Comment:    See lab report for associated comment(s)   HDL  Date Value Ref Range Status  07/20/2022 59 >39 mg/dL Final   Triglycerides  Date Value Ref Range Status  07/20/2022 62 0 - 149 mg/dL Final         Passed - Patient is not pregnant      Passed - Valid encounter within last 12 months    Recent Outpatient Visits           1 month ago Type 2 diabetes mellitus with diabetic nephropathy, without long-term current use of insulin ([place])   [place], [place], MD   7 months ago Type 2 diabetes mellitus with diabetic nephropathy, without long-term current use of insulin ([place])   [place] Charlott Rakes, MD   1 year  ago Type 2 diabetes mellitus with diabetic nephropathy, without long-term current use of insulin [facility])   [place], [place], [place]   1 year ago Type 2 diabetes mellitus with diabetic nephropathy, without long-term current use of insulin [facility])   [place], [place], [place]   2 years ago Type 2 diabetes mellitus with diabetic nephropathy, without long-term current use of insulin ([place])   [place], Charlane Ferretti, MD       Future Appointments             In 4 months Charlott Rakes, MD [place]             isosorbide mononitrate (IMDUR) 60 MG 24 hr tablet [Pharmacy Med Name: ISOSORBIDE MONONITRATE ER 60 MG Tablet Extended Release 24 Hour] 90 tablet 0    Sig: TAKE 1 TABLET EVERY DAY     Cardiovascular:  Nitrates Passed - 03/05/2023  2:34 PM      Passed - Last BP in normal range    BP  Readings from Last 1 Encounters:  01/23/23 135/65         Passed - Last Heart Rate in normal range    Pulse Readings from Last 1 Encounters:  01/23/23 71         Passed - Valid encounter within last 12 months    Recent Outpatient Visits           1 month ago Type 2 diabetes mellitus with diabetic nephropathy, without long-term current use of insulin ([place])   [place], [place], MD   7 months ago Type 2 diabetes mellitus with diabetic nephropathy, without long-term current use of insulin ([place])   [place], [place], MD   1 year ago Type 2 diabetes mellitus with diabetic nephropathy, without long-term current use of insulin [facility])   [place], [place], [place]   1 year ago Type 2 diabetes mellitus with diabetic nephropathy, without long-term current use of insulin [facility])   [place], [place], [place]   2 years ago Type 2 diabetes mellitus with diabetic nephropathy, without long-term current use of insulin ([place])   [place], Charlane Ferretti, MD       Future Appointments             In 4 months Charlott Rakes, MD [place]             hydrochlorothiazide (HYDRODIURIL) 25 MG tablet [Pharmacy Med Name: HYDROCHLOROTHIAZIDE 25 MG Tablet] 90 tablet 0    Sig: TAKE 1 TABLET EVERY DAY     Cardiovascular: Diuretics - Thiazide Failed - 03/05/2023  2:34 PM      Failed - Cr in normal range and within 180 days    Creat  Date Value Ref Range Status  10/05/2016 2.31 (H) 0.50 - 0.99 mg/dL Final    Comment:      For patients > or = 74 years of age: The upper reference limit for Creatinine is approximately 13% higher for people identified as African-American.      Creatinine, Ser  Date Value Ref Range Status  01/23/2023 2.11 (H) 0.57 - 1.00 mg/dL  Final   Creatinine, Urine  Date Value Ref Range Status  10/05/2016 116 20 - 320 mg/dL Final         Passed - K in normal range and within 180 days    Potassium  Date Value Ref Range Status  01/23/2023 4.2 3.5 - 5.2 mmol/L Final         Passed - Na in normal range and within 180 days    Sodium  Date Value Ref Range Status  01/23/2023 143 134 - 144 mmol/L Final         Passed - Last BP in normal range    BP Readings from Last 1 Encounters:  01/23/23 135/65         Passed - Valid encounter within last 6 months    Recent Outpatient Visits           1 month ago Type 2 diabetes mellitus with diabetic nephropathy, without long-term current use of insulin ([place])   [place], [place], MD   7 months ago Type 2 diabetes mellitus with diabetic nephropathy, without long-term current use of insulin ([place])   [place], [place], MD   1 year ago Type 2 diabetes mellitus with diabetic nephropathy, without long-term current use of insulin [facility])   [place], [place], [place]   1 year ago Type 2 diabetes mellitus with diabetic nephropathy, without long-term current use of insulin [facility])   [place], [place], [place]   2 years ago Type 2 diabetes mellitus with diabetic nephropathy, without long-term current use of insulin ([place])   [place] Charlott Rakes, MD       Future Appointments             In 4 months Charlott Rakes, MD [place]             glipiZIDE (GLUCOTROL) 10 MG tablet [Pharmacy Med Name: GLIPIZIDE 10 MG Tablet] 180 tablet 0    Sig: TAKE 1 TABLET TWICE DAILY BEFORE MEALS     Endocrinology:  Diabetes - Sulfonylureas Failed - 03/05/2023  2:34 PM      Failed - Cr in normal range and within 360 days    Creat  Date Value Ref Range  Status  10/05/2016 2.31 (H) 0.50 - 0.99 mg/dL Final    Comment:      For patients > or = 74 years of age: The upper reference limit for Creatinine is approximately 13% higher for people identified as African-American.      Creatinine, Ser  Date Value Ref Range Status  01/23/2023 2.11 (H) 0.57 - 1.00 mg/dL Final   Creatinine, Urine  Date Value Ref Range Status  10/05/2016 116 20 - 320 mg/dL Final         Passed - HBA1C is between 0 and 7.9 and within 180 days    HbA1c, POC (controlled diabetic range)  Date Value Ref Range Status  01/23/2023 6.0 0.0 - 7.0 % Final         Passed - Valid encounter within last 6 months    Recent Outpatient Visits           1 month ago Type 2 diabetes mellitus with diabetic nephropathy, without long-term current use of insulin ([place])   [place], [place]obong, MD   7 months  ago Type 2 diabetes mellitus with diabetic nephropathy, without long-term current use of insulin ([place])   [place], [place], MD   1 year ago Type 2 diabetes mellitus with diabetic nephropathy, without long-term current use of insulin [facility])   [place], [place], [place]   1 year ago Type 2 diabetes mellitus with diabetic nephropathy, without long-term current use of insulin [facility])   [place], [place], [place]   2 years ago Type 2 diabetes mellitus with diabetic nephropathy, without long-term current use of insulin ([place])   [place], MD       Future Appointments             In 4 months Charlott Rakes, MD [place]

## 2023-03-27 ENCOUNTER — Ambulatory Visit
Admission: RE | Admit: 2023-03-27 | Discharge: 2023-03-27 | Disposition: A | Discharge status: Home / Self Care | Payer: Medicare HMO | Source: Ambulatory Visit | Attending: Family Medicine | Admitting: Family Medicine

## 2023-03-27 DIAGNOSIS — Z1231 Encounter for screening mammogram for malignant neoplasm of breast: Secondary | ICD-10-CM | POA: Diagnosis not present

## 2023-04-20 DIAGNOSIS — N189 Chronic kidney disease, unspecified: Secondary | ICD-10-CM | POA: Diagnosis not present

## 2023-04-20 DIAGNOSIS — E1122 Type 2 diabetes mellitus with diabetic chronic kidney disease: Secondary | ICD-10-CM | POA: Diagnosis not present

## 2023-04-20 DIAGNOSIS — D631 Anemia in chronic kidney disease: Secondary | ICD-10-CM | POA: Diagnosis not present

## 2023-04-20 DIAGNOSIS — I129 Hypertensive chronic kidney disease with stage 1 through stage 4 chronic kidney disease, or unspecified chronic kidney disease: Secondary | ICD-10-CM | POA: Diagnosis not present

## 2023-04-20 DIAGNOSIS — N184 Chronic kidney disease, stage 4 (severe): Secondary | ICD-10-CM | POA: Diagnosis not present

## 2023-04-21 LAB — LAB REPORT - SCANNED: EGFR: 32

## 2023-04-30 NOTE — Patient Instructions (Signed)
Andrea Brown , Thank you for taking time to come for your Medicare Wellness Visit. I appreciate your ongoing commitment to your health goals. Please review the following plan we discussed and let me know if I can assist you in the future.   These are the goals we discussed:  Goals   None     This is a list of the screening recommended for you and due dates:  Health Maintenance  Topic Date Due   Zoster (Shingles) Vaccine (1 of 2) Never done   DEXA scan (bone density measurement)  Never done   COVID-19 Vaccine (4 - 2023-24 season) 08/04/2022   Eye exam for diabetics  08/17/2022   Flu Shot  07/05/2023   Medicare Annual Wellness Visit  07/19/2023   Yearly kidney health urinalysis for diabetes  07/21/2023   Complete foot exam   07/21/2023   Hemoglobin A1C  07/24/2023   Yearly kidney function blood test for diabetes  01/24/2024   Colon Cancer Screening  08/19/2024   Mammogram  03/26/2025   DTaP/Tdap/Td vaccine (2 - Td or Tdap) 05/24/2030   Pneumonia Vaccine  Completed   Hepatitis C Screening  Completed   HPV Vaccine  Aged Out    Advanced directives: Information on Advanced Care Planning can be found at [facility] Advance Health Care Directives Advance Health Care Directives ([email or web address]  Please bring a copy of your health care power of attorney and living will to the office to be added to your chart at your convenience.   Conditions/risks identified: Aim for 30 minutes of exercise or brisk walking, 6-8 glasses of water, and 5 servings of fruits and vegetables each day.  Next appointment: Follow up in one year for your annual wellness visit    Preventive Care 65 Years and Older, Female Preventive care refers to lifestyle choices and visits with your health care provider that can promote health and wellness. What does preventive care include? A yearly physical exam. This is also called an annual well check. Dental exams once or twice a year. Routine eye  exams. Ask your health care provider how often you should have your eyes checked. Personal lifestyle choices, including: Daily care of your teeth and gums. Regular physical activity. Eating a healthy diet. Avoiding tobacco and drug use. Limiting alcohol use. Practicing safe sex. Taking low-dose aspirin every day. Taking vitamin and mineral supplements as recommended by your health care provider. What happens during an annual well check? The services and screenings done by your health care provider during your annual well check will depend on your age, overall health, lifestyle risk factors, and family history of disease. Counseling  Your health care provider may ask you questions about your: Alcohol use. Tobacco use. Drug use. Emotional well-being. Home and relationship well-being. Sexual activity. Eating habits. History of falls. Memory and ability to understand (cognition). Work and work Astronomer. Reproductive health. Screening  You may have the following tests or measurements: Height, weight, and BMI. Blood pressure. Lipid and cholesterol levels. These may be checked every 5 years, or more frequently if you are over 27 years old. Skin check. Lung cancer screening. You may have this screening every year starting at age 48 if you have a 30-pack-year history of smoking and currently smoke or have quit within the past 15 years. Fecal occult blood test (FOBT) of the stool. You may have this test every year starting at age 44. Flexible sigmoidoscopy or colonoscopy. You may have a sigmoidoscopy every  5 years or a colonoscopy every 10 years starting at age 23. Hepatitis C blood test. Hepatitis B blood test. Sexually transmitted disease (STD) testing. Diabetes screening. This is done by checking your blood sugar (glucose) after you have not eaten for a while (fasting). You may have this done every 1-3 years. Bone density scan. This is done to screen for osteoporosis. You may have  this done starting at age 61. Mammogram. This may be done every 1-2 years. Talk to your health care provider about how often you should have regular mammograms. Talk with your health care provider about your test results, treatment options, and if necessary, the need for more tests. Vaccines  Your health care provider may recommend certain vaccines, such as: Influenza vaccine. This is recommended every year. Tetanus, diphtheria, and acellular pertussis (Tdap, Td) vaccine. You may need a Td booster every 10 years. Zoster vaccine. You may need this after age 25. Pneumococcal 13-valent conjugate (PCV13) vaccine. One dose is recommended after age 44. Pneumococcal polysaccharide (PPSV23) vaccine. One dose is recommended after age 82. Talk to your health care provider about which screenings and vaccines you need and how often you need them. This information is not intended to replace advice given to you by your health care provider. Make sure you discuss any questions you have with your health care provider. Document Released: 12/17/2015 Document Revised: 08/09/2016 Document Reviewed: 09/21/2015 Elsevier Interactive Patient Education  2017 [employer].  Fall Prevention in the Home Falls can cause injuries. They can happen to people of all ages. There are many things you can do to make your home safe and to help prevent falls. What can I do on the outside of my home? Regularly fix the edges of walkways and driveways and fix any cracks. Remove anything that might make you trip as you walk through a door, such as a raised step or threshold. Trim any bushes or trees on the path to your home. Use bright outdoor lighting. Clear any walking paths of anything that might make someone trip, such as rocks or tools. Regularly check to see if handrails are loose or broken. Make sure that both sides of any steps have handrails. Any raised decks and porches should have guardrails on the edges. Have any leaves,  snow, or ice cleared regularly. Use sand or salt on walking paths during winter. Clean up any spills in your garage right away. This includes oil or grease spills. What can I do in the bathroom? Use night lights. Install grab bars by the toilet and in the tub and shower. Do not use towel bars as grab bars. Use non-skid mats or decals in the tub or shower. If you need to sit down in the shower, use a plastic, non-slip stool. Keep the floor dry. Clean up any water that spills on the floor as soon as it happens. Remove soap buildup in the tub or shower regularly. Attach bath mats securely with double-sided non-slip rug tape. Do not have throw rugs and other things on the floor that can make you trip. What can I do in the bedroom? Use night lights. Make sure that you have a light by your bed that is easy to reach. Do not use any sheets or blankets that are too big for your bed. They should not hang down onto the floor. Have a firm chair that has side arms. You can use this for support while you get dressed. Do not have throw rugs and other things on  the floor that can make you trip. What can I do in the kitchen? Clean up any spills right away. Avoid walking on wet floors. Keep items that you use a lot in easy-to-reach places. If you need to reach something above you, use a strong step stool that has a grab bar. Keep electrical cords out of the way. Do not use floor polish or wax that makes floors slippery. If you must use wax, use non-skid floor wax. Do not have throw rugs and other things on the floor that can make you trip. What can I do with my stairs? Do not leave any items on the stairs. Make sure that there are handrails on both sides of the stairs and use them. Fix handrails that are broken or loose. Make sure that handrails are as long as the stairways. Check any carpeting to make sure that it is firmly attached to the stairs. Fix any carpet that is loose or worn. Avoid having throw  rugs at the top or bottom of the stairs. If you do have throw rugs, attach them to the floor with carpet tape. Make sure that you have a light switch at the top of the stairs and the bottom of the stairs. If you do not have them, ask someone to add them for you. What else can I do to help prevent falls? Wear shoes that: Do not have high heels. Have rubber bottoms. Are comfortable and fit you well. Are closed at the toe. Do not wear sandals. If you use a stepladder: Make sure that it is fully opened. Do not climb a closed stepladder. Make sure that both sides of the stepladder are locked into place. Ask someone to hold it for you, if possible. Clearly mark and make sure that you can see: Any grab bars or handrails. First and last steps. Where the edge of each step is. Use tools that help you move around (mobility aids) if they are needed. These include: Canes. Walkers. Scooters. Crutches. Turn on the lights when you go into a dark area. Replace any light bulbs as soon as they burn out. Set up your furniture so you have a clear path. Avoid moving your furniture around. If any of your floors are uneven, fix them. If there are any pets around you, be aware of where they are. Review your medicines with your doctor. Some medicines can make you feel dizzy. This can increase your chance of falling. Ask your doctor what other things that you can do to help prevent falls. This information is not intended to replace advice given to you by your health care provider. Make sure you discuss any questions you have with your health care provider. Document Released: 09/16/2009 Document Revised: 04/27/2016 Document Reviewed: 12/25/2014 Elsevier Interactive Patient Education  2017 [employer].

## 2023-04-30 NOTE — Progress Notes (Unsigned)
Subjective:   Andrea Brown is a 72 y.o. female who presents for Medicare Annual (Subsequent) preventive examination.  I connected with  Andrea Brown on 05/01/23 by a audio enabled telemedicine application and verified that I am speaking with the correct person using two identifiers.  Patient Location: Home  Provider Location: Home Office  I discussed the limitations of evaluation and management by telemedicine. The patient expressed understanding and agreed to proceed.   Review of Systems     Cardiac Risk Factors include: diabetes mellitus;advanced age (>70men, >26 women);smoking/ tobacco exposure;hypertension;dyslipidemia     Objective:    Today's Vitals   05/01/23 0831  Weight: 144 lb (65.3 kg)  Height: 5\' 1"  (1.549 m)   Body mass index is 27.21 kg/m.     05/01/2023    8:36 AM 07/18/2022   10:31 AM 05/24/2020   10:02 AM 02/23/2020   11:07 AM 02/13/2020    3:30 PM 10/05/2016    9:59 AM 08/05/2014    9:26 AM  Advanced Directives  Does Patient Have a Medical Advance Directive? No No No No No No No  Would patient like information on creating a medical advance directive? Yes (MAU/Ambulatory/Procedural Areas - Information given) Yes (ED - Information included in AVS)  No - Patient declined No - Patient declined No - patient declined information     Current Medications (verified) Outpatient Encounter Medications as of 05/01/2023  Medication Sig   Accu-Chek Softclix Lancets lancets Use as instructed tid before meals   amLODipine (NORVASC) 10 MG tablet TAKE 1 TABLET (10 MG TOTAL) BY MOUTH DAILY.   aspirin 81 MG tablet Take 1 tablet (81 mg total) daily by mouth.   Blood Glucose Monitoring Suppl (ACCU-CHEK GUIDE ME) w/Device KIT 1 each by Does not apply route 3 (three) times daily before meals.   Dulaglutide (TRULICITY) 0.75 MG/0.5ML SOPN Inject 0.75 mg into the skin once a week.   glipiZIDE (GLUCOTROL) 10 MG tablet TAKE 1 TABLET TWICE DAILY BEFORE MEALS   glucose  blood (ACCU-CHEK GUIDE) test strip Use as instructed 1 to 2 times per day. E11.21   hydrochlorothiazide (HYDRODIURIL) 25 MG tablet TAKE 1 TABLET EVERY DAY   isosorbide mononitrate (IMDUR) 60 MG 24 hr tablet TAKE 1 TABLET EVERY DAY   Misc. Devices MISC Blood pressure monitor; diagnosis-hypertension   simvastatin (ZOCOR) 10 MG tablet TAKE 1 TABLET AT BEDTIME   No facility-administered encounter medications on file as of 05/01/2023.    Allergies (verified) Patient has no known allergies.   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   Diabetes mellitus without complication (HCC)    Hyperlipidemia    Hypertension    Papilloma of right breast    Smoker    Past Surgical History:  Procedure Laterality Date   BREAST EXCISIONAL BIOPSY     BREAST LUMPECTOMY WITH RADIOACTIVE SEED LOCALIZATION Right 02/23/2020   Procedure: RIGHT BREAST LUMPECTOMY X 3 WITH RADIOACTIVE SEED LOCALIZATION;  Surgeon: Griselda Miner, MD;  Location: [facility];  Service: General;  Laterality: Right;   TOTAL ABDOMINAL HYSTERECTOMY  2000   Family History  Problem Relation Age of Onset   Diabetes Mother    Hypertension Mother    Colon cancer Neg Hx    Esophageal cancer Neg Hx    Pancreatic cancer Neg Hx    Rectal cancer Neg Hx    Stomach cancer Neg Hx  Social History   Socioeconomic History   Marital status: Single    Spouse name: Not on file   Number of children: Not on file   Years of education: Not on file   Highest education level: Not on file  Occupational History   Not on file  Tobacco Use   Smoking status: Every Day    Packs/day: .25    Types: Cigarettes   Smokeless tobacco: Never   Tobacco comments:    patient is trying EOD  Substance and Sexual Activity   Alcohol use: No   Drug use: No   Sexual activity: Not Currently    Birth control/protection: Surgical  Other Topics Concern   Not on file  Social History Narrative   Not  on file   Social Determinants of Health   Financial Resource Strain: Low Risk  (05/01/2023)   Overall Financial Resource Strain (CARDIA)    Difficulty of Paying Living Expenses: Not hard at all  Food Insecurity: No Food Insecurity (05/01/2023)   Hunger Vital Sign    Worried About Running Out of Food in the Last Year: Never true    Ran Out of Food in the Last Year: Never true  Transportation Needs: No Transportation Needs (05/01/2023)   PRAPARE - Administrator, Civil Service (Medical): No    Lack of Transportation (Non-Medical): No  Physical Activity: Sufficiently Active (05/01/2023)   Exercise Vital Sign    Days of Exercise per Week: 5 days    Minutes of Exercise per Session: 30 min  Stress: No Stress Concern Present (05/01/2023)   [employer] of Occupational Health - Occupational Stress Questionnaire    Feeling of Stress : Not at all  Social Connections: Moderately Isolated (05/01/2023)   Social Connection and Isolation Panel [NHANES]    Frequency of Communication with Friends and Family: More than three times a week    Frequency of Social Gatherings with Friends and Family: Three times a week    Attends Religious Services: 1 to 4 times per year    Active Member of Clubs or Organizations: No    Attends Engineer, structural: Never    Marital Status: Divorced    Tobacco Counseling Ready to quit: Not Answered Counseling given: Not Answered Tobacco comments: patient is trying EOD   Clinical Intake:  Pre-visit preparation completed: Yes  Pain : No/denies pain     Diabetes: Yes CBG done?: No Did pt. bring in CBG monitor from home?: No  How often do you need to have someone help you when you read instructions, pamphlets, or other written materials from your doctor or pharmacy?: 1 - Never  Diabetic?Yes   Nutrition Risk Assessment:  Has the patient had any N/V/D within the last 2 months?  No  Does the patient have any non-healing wounds?  No  Has  the patient had any unintentional weight loss or weight gain?  No   Diabetes:  Is the patient diabetic?  Yes  If diabetic, was a CBG obtained today?  No  Did the patient bring in their glucometer from home?  No  How often do you monitor your CBG's? As needed.   Financial Strains and Diabetes Management:  Are you having any financial strains with the device, your supplies or your medication? No .  Does the patient want to be seen by Chronic Care Management for management of their diabetes?  No  Would the patient like to be referred to a Nutritionist or for Diabetic  Management?  No   Diabetic Exams:  Diabetic Eye Exam: Overdue for diabetic eye exam. Pt has been advised about the importance in completing this exam. Patient advised to call and schedule an eye exam. Diabetic Foot Exam: Completed 07/20/22   Interpreter Needed?: No  Information entered by :: Kandis Fantasia LPN   Activities of Daily Living    05/01/2023    8:35 AM 07/18/2022   10:32 AM  In your present state of health, do you have any difficulty performing the following activities:  Hearing? 0 0  Vision? 0 0  Difficulty concentrating or making decisions? 0 0  Walking or climbing stairs? 0 0  Dressing or bathing? 0 0  Doing errands, shopping? 0 0  Preparing Food and eating ? N N  Using the Toilet? N N  In the past six months, have you accidently leaked urine? N N  Do you have problems with loss of bowel control? N N  Managing your Medications? N N  Managing your Finances? N N  Housekeeping or managing your Housekeeping? N N    Patient Care Team: Hoy Register, MD as PCP - General (Family Medicine) Annie Sable, MD as Consulting Physician (Nephrology)  Indicate any recent Medical Services you may have received from other than [facility] providers in the past year (date may be approximate).     Assessment:   This is a routine wellness examination for Tere.  Hearing/Vision screen Hearing Screening -  Comments:: Denies hearing difficulties   Vision Screening - Comments:: No vision problems; will schedule routine eye exam soon    Dietary issues and exercise activities discussed: Current Exercise Habits: Home exercise routine, Type of exercise: walking;stretching, Time (Minutes): 30, Frequency (Times/Week): 5, Weekly Exercise (Minutes/Week): 150, Intensity: Mild   Goals Addressed             This Visit's Progress    Remain active and independent         Depression Screen    05/01/2023    8:38 AM 07/18/2022   10:32 AM 01/19/2022    9:20 AM 05/24/2020   10:02 AM 03/23/2020    9:23 AM 01/23/2019    9:41 AM 01/23/2018    9:26 AM  PHQ 2/9 Scores  PHQ - 2 Score 0 0 0 0 0 0   PHQ- 9 Score   0  0 0   Exception Documentation       Patient refusal    Fall Risk    05/01/2023    8:35 AM 01/23/2023    8:39 AM 07/18/2022   10:32 AM 01/19/2022    9:20 AM 05/24/2020   10:02 AM  Fall Risk   Falls in the past year? 0 0 0 0 0  Number falls in past yr: 0 0 0 0   Injury with Fall? 0 0 0 0   Risk for fall due to : No Fall Risks   No Fall Risks No Fall Risks  Follow up Falls prevention discussed;Education provided;Falls evaluation completed        FALL RISK PREVENTION PERTAINING TO THE HOME:  Any stairs in or around the home? No  If so, are there any without handrails? No  Home free of loose throw rugs in walkways, pet beds, electrical cords, etc? Yes  Adequate lighting in your home to reduce risk of falls? Yes   ASSISTIVE DEVICES UTILIZED TO PREVENT FALLS:  Life alert? No  Use of a cane, walker or w/c? No  Grab bars in  the bathroom? Yes  Shower chair or bench in shower? No  Elevated toilet seat or a handicapped toilet? Yes   TIMED UP AND GO:  Was the test performed? No .  Telephonic visit   Cognitive Function:    05/24/2020   10:14 AM  MMSE - Mini Mental State Exam  Orientation to time 5  Orientation to Place 5  Registration 3  Attention/ Calculation 5  Recall 3  Language-  name 2 objects 2  Language- repeat 1  Language- follow 3 step command 3  Language- read & follow direction 1  Write a sentence 1  Copy design 1  Total score 30        05/01/2023    8:36 AM 07/18/2022   10:33 AM  6CIT Screen  What Year? 0 points 0 points  What month? 0 points 0 points  What time? 0 points 0 points  Count back from 20 0 points 0 points  Months in reverse 0 points 0 points  Repeat phrase 0 points 0 points  Total Score 0 points 0 points    Immunizations Immunization History  Administered Date(s) Administered   PFIZER(Purple Top)SARS-COV-2 Vaccination 03/25/2020, 04/19/2020, 12/21/2021   Pneumococcal Conjugate-13 04/22/2018   Pneumococcal Polysaccharide-23 05/24/2020   Tdap 05/24/2020    TDAP status: Up to date  Pneumococcal vaccine status: Up to date  Covid-19 vaccine status: Information provided on how to obtain vaccines.   Qualifies for Shingles Vaccine? Yes   Zostavax completed No   Shingrix Completed?: No.    Education has been provided regarding the importance of this vaccine. Patient has been advised to call insurance company to determine out of pocket expense if they have not yet received this vaccine. Advised may also receive vaccine at local pharmacy or Health Dept. Verbalized acceptance and understanding.  Screening Tests Health Maintenance  Topic Date Due   Zoster Vaccines- Shingrix (1 of 2) Never done   DEXA SCAN  Never done   COVID-19 Vaccine (4 - 2023-24 season) 08/04/2022   OPHTHALMOLOGY EXAM  08/17/2022   INFLUENZA VACCINE  07/05/2023   Diabetic kidney evaluation - Urine ACR  07/21/2023   FOOT EXAM  07/21/2023   HEMOGLOBIN A1C  07/24/2023   Diabetic kidney evaluation - eGFR measurement  01/24/2024   Medicare Annual Wellness (AWV)  04/30/2024   Colonoscopy  08/19/2024   MAMMOGRAM  03/26/2025   DTaP/Tdap/Td (2 - Td or Tdap) 05/24/2030   Pneumonia Vaccine 28+ Years old  Completed   Hepatitis C Screening  Completed   HPV VACCINES   Aged Out    Health Maintenance  Health Maintenance Due  Topic Date Due   Zoster Vaccines- Shingrix (1 of 2) Never done   DEXA SCAN  Never done   COVID-19 Vaccine (4 - 2023-24 season) 08/04/2022   OPHTHALMOLOGY EXAM  08/17/2022    Colorectal cancer screening: Type of screening: Colonoscopy. Completed 08/19/14. Repeat every 10 years  Mammogram status: Completed 03/27/23. Repeat every year  Bone Density status: Ordered 07/20/22. Pt provided with contact info and advised to call to schedule appt.  Lung Cancer Screening: (Low Dose CT Chest recommended if Age 77-80 years, 30 pack-year currently smoking OR have quit w/in 15years.) does not qualify.   Lung Cancer Screening Referral: n/a  Additional Screening:  Hepatitis C Screening: does qualify; Completed 04/22/18  Vision Screening: Recommended annual ophthalmology exams for early detection of glaucoma and other disorders of the eye. Is the patient up to date with their annual eye exam?  No  Who is the provider or what is the name of the office in which the patient attends annual eye exams? none If pt is not established with a provider, would they like to be referred to a provider to establish care? No .   Dental Screening: Recommended annual dental exams for proper oral hygiene  Community Resource Referral / Chronic Care Management: CRR required this visit?  No   CCM required this visit?  No      Plan:     I have personally reviewed and noted the following in the patient's chart:   Medical and social history Use of alcohol, tobacco or illicit drugs  Current medications and supplements including opioid prescriptions. Patient is not currently taking opioid prescriptions. Functional ability and status Nutritional status Physical activity Advanced directives List of other physicians Hospitalizations, surgeries, and ER visits in previous 12 months Vitals Screenings to include cognitive, depression, and falls Referrals and  appointments  In addition, I have reviewed and discussed with patient certain preventive protocols, quality metrics, and best practice recommendations. A written personalized care plan for preventive services as well as general preventive health recommendations were provided to patient.     Durwin Nora, [place]   3/76/2831   Due to this being a virtual visit, the after visit summary with patients personalized plan was offered to patient via mail or my-chart. per request, patient was mailed a copy of AVS.  Nurse Notes: No concerns

## 2023-05-01 ENCOUNTER — Ambulatory Visit: Payer: Medicare HMO | Attending: Family Medicine

## 2023-05-01 VITALS — Ht 61.0 in | Wt 144.0 lb

## 2023-05-01 DIAGNOSIS — Z0000 Encounter for general adult medical examination without abnormal findings: Secondary | ICD-10-CM | POA: Diagnosis not present

## 2023-06-14 ENCOUNTER — Other Ambulatory Visit: Payer: Self-pay | Admitting: Family Medicine

## 2023-06-14 DIAGNOSIS — E1122 Type 2 diabetes mellitus with diabetic chronic kidney disease: Secondary | ICD-10-CM

## 2023-06-14 DIAGNOSIS — E1121 Type 2 diabetes mellitus with diabetic nephropathy: Secondary | ICD-10-CM

## 2023-06-28 ENCOUNTER — Other Ambulatory Visit: Payer: Self-pay | Admitting: Family Medicine

## 2023-06-28 DIAGNOSIS — E1121 Type 2 diabetes mellitus with diabetic nephropathy: Secondary | ICD-10-CM

## 2023-06-28 NOTE — Telephone Encounter (Signed)
Requested Prescriptions  Pending Prescriptions Disp Refills   glipiZIDE (GLUCOTROL) 10 MG tablet [Pharmacy Med Name: GLIPIZIDE 10 MG Tablet] 60 tablet 0    Sig: TAKE 1 TABLET TWICE DAILY BEFORE MEALS     Endocrinology:  Diabetes - Sulfonylureas Failed - 06/28/2023  9:08 AM      Failed - Cr in normal range and within 360 days    Creat  Date Value Ref Range Status  10/05/2016 2.31 (H) 0.50 - 0.99 mg/dL Final    Comment:      For patients > or = 74 years of age: The upper reference limit for Creatinine is approximately 13% higher for people identified as African-American.      Creatinine, Ser  Date Value Ref Range Status  01/23/2023 2.11 (H) 0.57 - 1.00 mg/dL Final   Creatinine, Urine  Date Value Ref Range Status  10/05/2016 116 20 - 320 mg/dL Final         Passed - HBA1C is between 0 and 7.9 and within 180 days    HbA1c, POC (controlled diabetic range)  Date Value Ref Range Status  01/23/2023 6.0 0.0 - 7.0 % Final         Passed - Valid encounter within last 6 months    Recent Outpatient Visits           5 months ago Type 2 diabetes mellitus with diabetic nephropathy, without long-term current use of insulin (HCC)   [place] [facility] [place], [place], [place]   11 months ago Type 2 diabetes mellitus with diabetic nephropathy, without long-term current use of insulin (HCC)   [place] [facility] [place], [place], [place]   1 year ago Type 2 diabetes mellitus with diabetic nephropathy, without long-term current use of insulin [facility])   [place] [facility] [place], [place], [place]   1 year ago Type 2 diabetes mellitus with diabetic nephropathy, without long-term current use of insulin [facility])   [place] [facility] [place], [place], [place]   2 years ago Type 2 diabetes mellitus with diabetic nephropathy, without long-term current use of insulin (HCC)   [place] [facility] Hoy Register, MD       Future Appointments             In 3 weeks [place], [place] [facility]

## 2023-07-02 ENCOUNTER — Other Ambulatory Visit: Payer: Self-pay | Admitting: Family Medicine

## 2023-07-02 DIAGNOSIS — E1122 Type 2 diabetes mellitus with diabetic chronic kidney disease: Secondary | ICD-10-CM

## 2023-07-02 DIAGNOSIS — E785 Hyperlipidemia, unspecified: Secondary | ICD-10-CM

## 2023-07-03 NOTE — Telephone Encounter (Signed)
Requested by interface surescripts. Future visit in 3 weeks  Requested Prescriptions  Pending Prescriptions Disp Refills   hydrochlorothiazide (HYDRODIURIL) 25 MG tablet [Pharmacy Med Name: HYDROCHLOROTHIAZIDE 25 MG Tablet] 90 tablet 0    Sig: TAKE 1 TABLET EVERY DAY     Cardiovascular: Diuretics - Thiazide Failed - 07/02/2023  3:33 PM      Failed - Cr in normal range and within 180 days    Creat  Date Value Ref Range Status  10/05/2016 2.31 (H) 0.50 - 0.99 mg/dL Final    Comment:      For patients > or = 74 years of age: The upper reference limit for Creatinine is approximately 13% higher for people identified as African-American.      Creatinine, Ser  Date Value Ref Range Status  01/23/2023 2.11 (H) 0.57 - 1.00 mg/dL Final   Creatinine, Urine  Date Value Ref Range Status  10/05/2016 116 20 - 320 mg/dL Final         Passed - K in normal range and within 180 days    Potassium  Date Value Ref Range Status  01/23/2023 4.2 3.5 - 5.2 mmol/L Final         Passed - Na in normal range and within 180 days    Sodium  Date Value Ref Range Status  01/23/2023 143 134 - 144 mmol/L Final         Passed - Last BP in normal range    BP Readings from Last 1 Encounters:  01/23/23 135/65         Passed - Valid encounter within last 6 months    Recent Outpatient Visits           5 months ago Type 2 diabetes mellitus with diabetic nephropathy, without long-term current use of insulin (HCC)   [place] [facility] [place], [place], [place]   11 months ago Type 2 diabetes mellitus with diabetic nephropathy, without long-term current use of insulin (HCC)   [place] [facility] [place], [place], [place]   1 year ago Type 2 diabetes mellitus with diabetic nephropathy, without long-term current use of insulin [facility])   [place] [facility] [place], [place], [place]   1 year ago Type 2 diabetes mellitus with diabetic nephropathy,  without long-term current use of insulin [facility])   [place] [facility] [place], [place], [place]   2 years ago Type 2 diabetes mellitus with diabetic nephropathy, without long-term current use of insulin (HCC)   [place] [facility] [place], Odette [place]s, MD       Future Appointments             In 3 weeks [place], Marzella [place]lein, [place] [facility]             simvastatin (ZOCOR) 10 MG tablet [Pharmacy Med Name: SIMVASTATIN 10 MG Tablet] 90 tablet 0    Sig: TAKE 1 TABLET AT BEDTIME     Cardiovascular:  Antilipid - Statins Failed - 07/02/2023  3:33 PM      Failed - Lipid Panel in normal range within the last 12 months    Cholesterol, Total  Date Value Ref Range Status  07/20/2022 169 100 - 199 mg/dL Final   LDL Chol Calc (NIH)  Date Value Ref Range Status  07/20/2022 98 0 - 99 mg/dL Final   Direct LDL  Date Value Ref  Range Status  09/09/2009 40 mg/dL Final    Comment:    See lab report for associated comment(s)   HDL  Date Value Ref Range Status  07/20/2022 59 >39 mg/dL Final   Triglycerides  Date Value Ref Range Status  07/20/2022 62 0 - 149 mg/dL Final         Passed - Patient is not pregnant      Passed - Valid encounter within last 12 months    Recent Outpatient Visits           5 months ago Type 2 diabetes mellitus with diabetic nephropathy, without long-term current use of insulin (HCC)   [place] [facility] [place], [place], [place]   11 months ago Type 2 diabetes mellitus with diabetic nephropathy, without long-term current use of insulin (HCC)   [place] [facility] [place], [place], [place]   1 year ago Type 2 diabetes mellitus with diabetic nephropathy, without long-term current use of insulin [facility])   [place] [facility] [place], [place], [place]   1 year ago Type 2 diabetes mellitus with diabetic nephropathy,  without long-term current use of insulin [facility])   [place] [facility] [place], [place], [place]   2 years ago Type 2 diabetes mellitus with diabetic nephropathy, without long-term current use of insulin [facility])   [place] [facility] Hoy Register, MD       Future Appointments             In 3 weeks [place], [place] [facility]

## 2023-07-07 ENCOUNTER — Other Ambulatory Visit: Payer: Self-pay | Admitting: Family Medicine

## 2023-07-07 DIAGNOSIS — E1122 Type 2 diabetes mellitus with diabetic chronic kidney disease: Secondary | ICD-10-CM

## 2023-07-09 NOTE — Telephone Encounter (Signed)
Requested Prescriptions  Pending Prescriptions Disp Refills   amLODipine (NORVASC) 10 MG tablet [Pharmacy Med Name: AMLODIPINE BESYLATE 10 MG Tablet] 30 tablet 0    Sig: TAKE 1 TABLET EVERY DAY     Cardiovascular: Calcium Channel Blockers 2 Passed - 07/07/2023  3:31 AM      Passed - Last BP in normal range    BP Readings from Last 1 Encounters:  01/23/23 135/65         Passed - Last Heart Rate in normal range    Pulse Readings from Last 1 Encounters:  01/23/23 71         Passed - Valid encounter within last 6 months    Recent Outpatient Visits           5 months ago Type 2 diabetes mellitus with diabetic nephropathy, without long-term current use of insulin (HCC)   [place] [facility] [place], [place], [place]   11 months ago Type 2 diabetes mellitus with diabetic nephropathy, without long-term current use of insulin (HCC)   [place] [facility] [place], [place], [place]   1 year ago Type 2 diabetes mellitus with diabetic nephropathy, without long-term current use of insulin [facility])   [facility] [place], [place], [place]   1 year ago Type 2 diabetes mellitus with diabetic nephropathy, without long-term current use of insulin [facility])   [facility] [place], [place], [place]   2 years ago Type 2 diabetes mellitus with diabetic nephropathy, without long-term current use of insulin (HCC)   [place] [facility] [place], Odette Horns, MD       Future Appointments             In 2 weeks [place], Marzella Schlein, [place] [facility]             isosorbide mononitrate (IMDUR) 60 MG 24 hr tablet [Pharmacy Med Name: ISOSORBIDE MONONITRATE ER 60 MG Tablet Extended Release 24 Hour] 30 tablet 5    Sig: TAKE 1 TABLET EVERY DAY     Cardiovascular:  Nitrates Passed - 07/07/2023  3:31 AM      Passed - Last BP in normal range    BP Readings  from Last 1 Encounters:  01/23/23 135/65         Passed - Last Heart Rate in normal range    Pulse Readings from Last 1 Encounters:  01/23/23 71         Passed - Valid encounter within last 12 months    Recent Outpatient Visits           5 months ago Type 2 diabetes mellitus with diabetic nephropathy, without long-term current use of insulin (HCC)   [place] [facility] [place], [place], [place]   11 months ago Type 2 diabetes mellitus with diabetic nephropathy, without long-term current use of insulin (HCC)   [place] [facility] [place], [place], [place]   1 year ago Type 2 diabetes mellitus with diabetic nephropathy, without long-term current use of insulin [facility])   [place] [facility] [place], [place], [place]   1 year ago Type 2 diabetes mellitus with diabetic nephropathy, without long-term current use of insulin [facility])    [facility] [place], [place], [place]   2 years ago Type 2 diabetes mellitus with diabetic  nephropathy, without long-term current use of insulin [facility])   [place] [facility] Hoy Register, MD       Future Appointments             In 2 weeks [place], [place] [facility]

## 2023-07-24 ENCOUNTER — Ambulatory Visit: Payer: Medicare HMO | Attending: Family Medicine | Admitting: Physician Assistant

## 2023-07-24 DIAGNOSIS — E785 Hyperlipidemia, unspecified: Secondary | ICD-10-CM

## 2023-07-24 DIAGNOSIS — E1121 Type 2 diabetes mellitus with diabetic nephropathy: Secondary | ICD-10-CM | POA: Diagnosis not present

## 2023-07-24 DIAGNOSIS — I129 Hypertensive chronic kidney disease with stage 1 through stage 4 chronic kidney disease, or unspecified chronic kidney disease: Secondary | ICD-10-CM | POA: Diagnosis not present

## 2023-07-24 DIAGNOSIS — N184 Chronic kidney disease, stage 4 (severe): Secondary | ICD-10-CM

## 2023-07-24 DIAGNOSIS — E1122 Type 2 diabetes mellitus with diabetic chronic kidney disease: Secondary | ICD-10-CM | POA: Diagnosis not present

## 2023-07-24 DIAGNOSIS — Z7985 Long-term (current) use of injectable non-insulin antidiabetic drugs: Secondary | ICD-10-CM | POA: Diagnosis not present

## 2023-07-24 MED ORDER — HYDROCHLOROTHIAZIDE 25 MG PO TABS: 25.0000 mg | ORAL_TABLET | Freq: Every day | ORAL | 1 refills | Status: DC

## 2023-07-24 MED ORDER — GLIPIZIDE 10 MG PO TABS: 10.0000 mg | ORAL_TABLET | Freq: Two times a day (BID) | ORAL | 4 refills | Status: DC

## 2023-07-24 MED ORDER — AMLODIPINE BESYLATE 10 MG PO TABS: 10.0000 mg | ORAL_TABLET | Freq: Every day | ORAL | 0 refills | Status: DC

## 2023-07-24 MED ORDER — ASPIRIN 81 MG PO TBEC: 81.0000 mg | DELAYED_RELEASE_TABLET | Freq: Every day | ORAL | 3 refills | Status: AC

## 2023-07-24 MED ORDER — ISOSORBIDE MONONITRATE ER 60 MG PO TB24: 60.0000 mg | ORAL_TABLET | Freq: Every day | ORAL | 1 refills | Status: DC

## 2023-07-24 MED ORDER — TRULICITY 0.75 MG/0.5ML ~~LOC~~ SOAJ: 0.7500 mg | SUBCUTANEOUS | 5 refills | Status: DC

## 2023-07-24 MED ORDER — SIMVASTATIN 10 MG PO TABS: ORAL_TABLET | ORAL | 0 refills | Status: DC

## 2023-07-24 NOTE — Progress Notes (Signed)
Patient ID: Andrea Brown, female   DOB: [birth year], 72 y.o.   MRN: [id number]   Trenady Debenedetto, is a 72 y.o. female  [id number]  [id number]  DOB - 04-Aug-1951  Chief Complaint  Patient presents with   Medical Management of Chronic Issues       Subjective:   Andrea Brown is a 72 y.o. female here today for med RF.  She denies any new issues or concerns.  She has not been checking blood sugars regularly but when she checks if, she usu gets ~100.  Does not have BP cuff at home.  No dizziness/CP/SOB.    No problems updated.  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 Authorizing Provider  Accu-Chek Softclix Lancets lancets Use as instructed tid before meals 08/21/22  Yes Hoy Register, MD  aspirin EC 81 MG tablet Take 1 tablet (81 mg total) by mouth daily. Swallow whole. 07/24/23  Yes Bree Heinzelman M, PA-C  Blood Glucose Monitoring Suppl (ACCU-CHEK GUIDE ME) w/Device KIT 1 each by Does not apply route 3 (three) times daily before meals. 08/21/22  Yes [place]lin, Odette Horns, MD  glucose blood (ACCU-CHEK GUIDE) test strip Use as instructed 1 to 2 times per day. E11.21 08/21/22  Yes Hoy Register, MD  Misc. Devices MISC Blood pressure monitor; diagnosis-hypertension 09/10/19  Yes Newlin, [place], MD  amLODipine (NORVASC) 10 MG tablet Take 1 tablet (10 mg total) by mouth daily. 07/24/23   Anders Simmonds, PA-C  Dulaglutide (TRULICITY) 0.75 MG/0.5ML SOPN Inject 0.75 mg into the skin once a week. 07/24/23   Anders Simmonds, PA-C  glipiZIDE (GLUCOTROL) 10 MG tablet Take 1 tablet (10 mg total) by mouth 2 (two) times daily before a meal. 07/24/23   Bentlee Drier, Marzella Schlein,  PA-C  hydrochlorothiazide (HYDRODIURIL) 25 MG tablet Take 1 tablet (25 mg total) by mouth daily. 07/24/23   Anders Simmonds, PA-C  isosorbide mononitrate (IMDUR) 60 MG 24 hr tablet Take 1 tablet (60 mg total) by mouth daily. 07/24/23   Anders Simmonds, PA-C  simvastatin (ZOCOR) 10 MG tablet TAKE 1 TABLET AT BEDTIME 07/24/23   Leighton Luster M, PA-C    ROS: Neg HEENT Neg resp Neg cardiac Neg GI Neg GU Neg MS Neg psych Neg neuro  Objective:   Vitals:   07/24/23 0846  BP: (!) 135/54  Pulse: 71  SpO2: 98%  Weight: 142 lb 3.2 oz (64.5 kg)   Exam General appearance : Awake, alert, not in any distress. Speech Clear. Not toxic looking HEENT: Atraumatic and Normocephalic, pupils equally reactive to light and accomodation Neck: Supple, no JVD. No cervical lymphadenopathy.  Chest: Good air entry bilaterally, CTAB.  No rales/rhonchi/wheezing CVS: S1 S2 regular, no murmurs.  Abdomen: Bowel sounds present, Non tender and not distended with no gaurding, rigidity or rebound. Extremities: B/L Lower Ext shows no edema, both legs are warm to touch Neurology: Awake alert, and oriented X 3, CN II-XII intact, Non focal Skin: No Rash  Data Review Lab Results  Component Value Date   HGBA1C 6.0 01/23/2023   HGBA1C 5.9 07/20/2022   HGBA1C 5.9 (A) 01/19/2022  Assessment & Plan   1. Type 2 diabetes mellitus with diabetic nephropathy, without long-term current use of insulin (HCC) - glipiZIDE (GLUCOTROL) 10 MG tablet; Take 1 tablet (10 mg total) by mouth 2 (two) times daily before a meal.  Dispense: 60 tablet; Refill: 4 - Dulaglutide (TRULICITY) 0.75 MG/0.5ML SOPN; Inject 0.75 mg into the skin once a week.  Dispense: 6 mL; Refill: 5 - Comprehensive metabolic panel - CBC with Differential/Platelet - Hemoglobin A1c  2. 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  Dispense: 90 tablet; Refill: 1 - amLODipine (NORVASC) 10 MG tablet; Take 1 tablet (10 mg total) by mouth daily.  Dispense: 90 tablet; Refill: 0 - isosorbide mononitrate (IMDUR) 60 MG 24 hr tablet; Take 1 tablet (60 mg total) by mouth daily.  Dispense: 90 tablet; Refill: 1 - Comprehensive metabolic panel - CBC with Differential/Platelet  3. Dyslipidemia - simvastatin (ZOCOR) 10 MG tablet; TAKE 1 TABLET AT BEDTIME  Dispense: 90 tablet; Refill: 0 - Lipid panel - Comprehensive metabolic panel - CBC with Differential/Platelet - Hemoglobin A1c    Return in about 6 months (around 01/24/2024) for PCP/Newlin for chronic conditions.  The patient was given clear instructions to go to ER or return to medical center if symptoms don't improve, worsen or new problems develop. The patient verbalized understanding. The patient was told to call to get lab results if they haven't heard anything in the next week.      Georgian Co, PA-C [facility] [place], [place] [phone]   07/24/2023, 9:04 AM

## 2023-07-25 ENCOUNTER — Other Ambulatory Visit: Payer: Self-pay | Admitting: Physician Assistant

## 2023-07-25 ENCOUNTER — Telehealth: Payer: Self-pay

## 2023-07-25 ENCOUNTER — Telehealth: Payer: Self-pay | Admitting: Family Medicine

## 2023-07-25 DIAGNOSIS — E1121 Type 2 diabetes mellitus with diabetic nephropathy: Secondary | ICD-10-CM

## 2023-07-25 DIAGNOSIS — N184 Chronic kidney disease, stage 4 (severe): Secondary | ICD-10-CM

## 2023-07-25 LAB — CBC WITH DIFFERENTIAL/PLATELET
Basophils Absolute: 0 10*3/uL (ref 0.0–0.2)
Basos: 0 %
EOS (ABSOLUTE): 0.3 10*3/uL (ref 0.0–0.4)
Eos: 4 %
Hematocrit: 33.4 % — ABNORMAL LOW (ref 34.0–46.6)
Hemoglobin: 11.1 g/dL (ref 11.1–15.9)
Immature Grans (Abs): 0 10*3/uL (ref 0.0–0.1)
Immature Granulocytes: 0 %
Lymphocytes Absolute: 2.4 10*3/uL (ref 0.7–3.1)
Lymphs: 33 %
MCH: 28 pg (ref 26.6–33.0)
MCHC: 33.2 g/dL (ref 31.5–35.7)
MCV: 84 fL (ref 79–97)
Monocytes Absolute: 0.5 10*3/uL (ref 0.1–0.9)
Monocytes: 7 %
Neutrophils Absolute: 4 10*3/uL (ref 1.4–7.0)
Neutrophils: 56 %
Platelets: 252 10*3/uL (ref 150–450)
RBC: 3.96 x10E6/uL (ref 3.77–5.28)
RDW: 14 % (ref 11.7–15.4)
WBC: 7.3 10*3/uL (ref 3.4–10.8)

## 2023-07-25 LAB — COMPREHENSIVE METABOLIC PANEL
ALT: 10 IU/L (ref 0–32)
AST: 20 IU/L (ref 0–40)
Albumin: 4.2 g/dL (ref 3.8–4.8)
Alkaline Phosphatase: 70 IU/L (ref 44–121)
BUN/Creatinine Ratio: 25 (ref 12–28)
BUN: 56 mg/dL — ABNORMAL HIGH (ref 8–27)
Bilirubin Total: 0.2 mg/dL (ref 0.0–1.2)
CO2: 27 mmol/L (ref 20–29)
Calcium: 9.1 mg/dL (ref 8.7–10.3)
Chloride: 98 mmol/L (ref 96–106)
Creatinine, Ser: 2.25 mg/dL — ABNORMAL HIGH (ref 0.57–1.00)
Globulin, Total: 3 g/dL (ref 1.5–4.5)
Glucose: 137 mg/dL — ABNORMAL HIGH (ref 70–99)
Potassium: 4.3 mmol/L (ref 3.5–5.2)
Sodium: 141 mmol/L (ref 134–144)
Total Protein: 7.2 g/dL (ref 6.0–8.5)
eGFR: 22 mL/min/{1.73_m2} — ABNORMAL LOW (ref >59)

## 2023-07-25 LAB — LIPID PANEL
Chol/HDL Ratio: 2.6 ratio (ref 0.0–4.4)
Cholesterol, Total: 141 mg/dL (ref 100–199)
HDL: 54 mg/dL (ref >39)
LDL Chol Calc (NIH): 74 mg/dL (ref 0–99)
Triglycerides: 61 mg/dL (ref 0–149)
VLDL Cholesterol Cal: 13 mg/dL (ref 5–40)

## 2023-07-25 LAB — HEMOGLOBIN A1C
Est. average glucose Bld gHb Est-mCnc: 123 mg/dL
Hgb A1c MFr Bld: 5.9 % — ABNORMAL HIGH (ref 4.8–5.6)

## 2023-07-25 NOTE — Telephone Encounter (Signed)
-----   Message from Georgian Co sent at 07/25/2023 12:28 PM EDT ----- Please call patient.  Kidney disease has worsened.  I am placing a referral to the kidney specialist.  Make sure an go to the appointment.  Your diabetes is stable(A1C=5.9).  cholesterol is good-continue medications.  Blood count is stable.  Thanks, Georgian Co, PA-C

## 2023-07-25 NOTE — Telephone Encounter (Signed)
Pt. Given lab results and instructions. Has an appointment "with my kidney doctor in a couple months."

## 2023-07-25 NOTE — Telephone Encounter (Signed)
Patient already called in for lab results

## 2023-07-29 ENCOUNTER — Other Ambulatory Visit: Payer: Self-pay | Admitting: Family Medicine

## 2023-07-29 DIAGNOSIS — E1121 Type 2 diabetes mellitus with diabetic nephropathy: Secondary | ICD-10-CM

## 2023-07-30 ENCOUNTER — Other Ambulatory Visit: Payer: Self-pay | Admitting: Family Medicine

## 2023-07-30 DIAGNOSIS — E1122 Type 2 diabetes mellitus with diabetic chronic kidney disease: Secondary | ICD-10-CM

## 2023-08-01 ENCOUNTER — Other Ambulatory Visit: Payer: Self-pay | Admitting: Pharmacist

## 2023-08-01 NOTE — Progress Notes (Signed)
Pharmacy Quality Measure Review  This patient is appearing on a report for being at risk of failing the adherence measure for cholesterol (statin) medications this calendar year.   Medication: simvastatin Last fill date: 07/03/2023 for 90 day supply. Has a new rxn on file sent 07/24/2023.  Insurance report was not up to date. No action needed at this time.   Butch Penny, PharmD, [place]y [place], CPP Clinical Pharmacist [facility] [phone]

## 2023-09-11 DIAGNOSIS — D631 Anemia in chronic kidney disease: Secondary | ICD-10-CM | POA: Diagnosis not present

## 2023-09-11 DIAGNOSIS — I129 Hypertensive chronic kidney disease with stage 1 through stage 4 chronic kidney disease, or unspecified chronic kidney disease: Secondary | ICD-10-CM | POA: Diagnosis not present

## 2023-09-11 DIAGNOSIS — N390 Urinary tract infection, site not specified: Secondary | ICD-10-CM | POA: Diagnosis not present

## 2023-09-11 DIAGNOSIS — N189 Chronic kidney disease, unspecified: Secondary | ICD-10-CM | POA: Diagnosis not present

## 2023-09-11 DIAGNOSIS — N184 Chronic kidney disease, stage 4 (severe): Secondary | ICD-10-CM | POA: Diagnosis not present

## 2023-09-11 DIAGNOSIS — E1122 Type 2 diabetes mellitus with diabetic chronic kidney disease: Secondary | ICD-10-CM | POA: Diagnosis not present

## 2023-09-12 LAB — LAB REPORT - SCANNED
Creatinine, POC: 77 mg/dL
EGFR: 25

## 2023-09-14 ENCOUNTER — Other Ambulatory Visit: Payer: Self-pay | Admitting: Family Medicine

## 2023-09-14 DIAGNOSIS — E1122 Type 2 diabetes mellitus with diabetic chronic kidney disease: Secondary | ICD-10-CM

## 2023-09-14 DIAGNOSIS — E785 Hyperlipidemia, unspecified: Secondary | ICD-10-CM

## 2023-09-14 DIAGNOSIS — E1121 Type 2 diabetes mellitus with diabetic nephropathy: Secondary | ICD-10-CM

## 2023-10-11 ENCOUNTER — Other Ambulatory Visit: Payer: Self-pay | Admitting: Physician Assistant

## 2023-10-11 DIAGNOSIS — E1122 Type 2 diabetes mellitus with diabetic chronic kidney disease: Secondary | ICD-10-CM

## 2024-01-22 ENCOUNTER — Other Ambulatory Visit: Payer: Self-pay | Admitting: Family Medicine

## 2024-01-22 DIAGNOSIS — Z1231 Encounter for screening mammogram for malignant neoplasm of breast: Secondary | ICD-10-CM

## 2024-01-28 ENCOUNTER — Other Ambulatory Visit: Payer: Self-pay | Admitting: Family Medicine

## 2024-01-28 DIAGNOSIS — E1122 Type 2 diabetes mellitus with diabetic chronic kidney disease: Secondary | ICD-10-CM

## 2024-01-28 DIAGNOSIS — E1121 Type 2 diabetes mellitus with diabetic nephropathy: Secondary | ICD-10-CM

## 2024-02-09 ENCOUNTER — Other Ambulatory Visit: Payer: Self-pay | Admitting: Physician Assistant

## 2024-02-09 ENCOUNTER — Other Ambulatory Visit: Payer: Self-pay | Admitting: Family Medicine

## 2024-02-09 DIAGNOSIS — E1122 Type 2 diabetes mellitus with diabetic chronic kidney disease: Secondary | ICD-10-CM

## 2024-02-09 DIAGNOSIS — E785 Hyperlipidemia, unspecified: Secondary | ICD-10-CM

## 2024-03-06 ENCOUNTER — Ambulatory Visit: Payer: Medicare HMO | Attending: Family Medicine | Admitting: Family Medicine

## 2024-03-06 ENCOUNTER — Encounter: Payer: Self-pay | Admitting: Family Medicine

## 2024-03-06 ENCOUNTER — Telehealth: Payer: Self-pay | Admitting: Pharmacist

## 2024-03-06 ENCOUNTER — Other Ambulatory Visit: Payer: Self-pay

## 2024-03-06 VITALS — BP 129/61 | HR 67 | Ht 61.0 in | Wt 145.8 lb

## 2024-03-06 DIAGNOSIS — E1122 Type 2 diabetes mellitus with diabetic chronic kidney disease: Secondary | ICD-10-CM | POA: Diagnosis not present

## 2024-03-06 DIAGNOSIS — Z7984 Long term (current) use of oral hypoglycemic drugs: Secondary | ICD-10-CM

## 2024-03-06 DIAGNOSIS — E1121 Type 2 diabetes mellitus with diabetic nephropathy: Secondary | ICD-10-CM

## 2024-03-06 DIAGNOSIS — N184 Chronic kidney disease, stage 4 (severe): Secondary | ICD-10-CM

## 2024-03-06 DIAGNOSIS — E785 Hyperlipidemia, unspecified: Secondary | ICD-10-CM

## 2024-03-06 DIAGNOSIS — Z7985 Long-term (current) use of injectable non-insulin antidiabetic drugs: Secondary | ICD-10-CM

## 2024-03-06 DIAGNOSIS — F17210 Nicotine dependence, cigarettes, uncomplicated: Secondary | ICD-10-CM | POA: Diagnosis not present

## 2024-03-06 DIAGNOSIS — I129 Hypertensive chronic kidney disease with stage 1 through stage 4 chronic kidney disease, or unspecified chronic kidney disease: Secondary | ICD-10-CM | POA: Diagnosis not present

## 2024-03-06 LAB — POCT GLYCOSYLATED HEMOGLOBIN (HGB A1C): HbA1c, POC (controlled diabetic range): 6.6 % (ref 0.0–7.0)

## 2024-03-06 MED ORDER — ISOSORBIDE MONONITRATE ER 60 MG PO TB24: 60.0000 mg | ORAL_TABLET | Freq: Every day | ORAL | 1 refills | Status: DC

## 2024-03-06 MED ORDER — SIMVASTATIN 10 MG PO TABS: 10.0000 mg | ORAL_TABLET | Freq: Every day | ORAL | 1 refills | Status: DC

## 2024-03-06 MED ORDER — GLIPIZIDE 10 MG PO TABS: 10.0000 mg | ORAL_TABLET | Freq: Two times a day (BID) | ORAL | 1 refills | Status: DC

## 2024-03-06 MED ORDER — OZEMPIC (0.25 OR 0.5 MG/DOSE) 2 MG/3ML ~~LOC~~ SOPN
0.2500 mg | PEN_INJECTOR | SUBCUTANEOUS | 6 refills | Status: DC
  Filled 2024-03-06: qty 3, 56d supply, fill #0

## 2024-03-06 MED ORDER — HYDROCHLOROTHIAZIDE 25 MG PO TABS: 25.0000 mg | ORAL_TABLET | Freq: Every day | ORAL | 1 refills | Status: DC

## 2024-03-06 MED ORDER — AMLODIPINE BESYLATE 10 MG PO TABS: 10.0000 mg | ORAL_TABLET | Freq: Every day | ORAL | 1 refills | Status: DC

## 2024-03-06 NOTE — Telephone Encounter (Signed)
 Hey friend,   Pt has ~$300 deductible with Trulicity. I imagine due to an unmet deductible with her not setting up a deductible plan. With that being said, if Dr. Alvis Lemmings is okay with her trying Ozempic, could we apply for patient assistance for Ozempic?

## 2024-03-06 NOTE — Progress Notes (Signed)
 Subjective:  Patient ID: Andrea Brown, female    DOB: 1951/11/23  Age: 73 y.o. MRN: [id number]  CC: Medical Management of Chronic Issues (Discuss Trulicity)     Discussed the use of AI scribe software for clinical note transcription with the patient, who gave verbal consent to proceed.  History of Present Illness The patient, with a history of Diabetes Mellitus Type 2 (A1c 5.9), Hypertension, Hyperlipidemia, CKD stage 4 and tobacco use presents with concerns about the affordability of her diabetes medication, Trulicity. She reports that the cost has increased to $381, which she is unable to afford. She has been taking the lowest dose of Trulicity (0.75mg ) once a week, in addition to glipizide 10mg  twice daily. The patient's most recent HbA1c was 6.6. She expresses confidence in managing her diabetes with glipizide and dietary modifications if Trulicity is discontinued.  The patient also mentions her hypertension, for which she is taking amlodipine, hydrochlorothiazide, and isosorbide.  Her blood pressure at the visit was 143/63. She reports adherence to her blood pressure medications.  Regarding her stage 4 kidney disease, the patient has not seen her nephrologist recently due to inclement weather. She plans to schedule an appointment after the current visit. She is aware of the need to avoid certain medications, such as Aleve and ibuprofen, due to her kidney disease.  The patient admits to smoking but has not smoked since the previous evening. She expresses a desire to quit but is concerned about potential weight gain.  Declines pharmacotherapeutic intervention to assist in quitting.  She reports a decrease in appetite as she ages.    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   Diabetes mellitus without complication (HCC)    Hyperlipidemia    Hypertension    Papilloma of right breast    Smoker     Past Surgical History:  Procedure  Laterality Date   BREAST EXCISIONAL BIOPSY     BREAST LUMPECTOMY WITH RADIOACTIVE SEED LOCALIZATION Right 02/23/2020   Procedure: RIGHT BREAST LUMPECTOMY X 3 WITH RADIOACTIVE SEED LOCALIZATION;  Surgeon: Griselda Miner, MD;  Location: [facility];  Service: General;  Laterality: Right;   TOTAL ABDOMINAL HYSTERECTOMY  2000    Family History  Problem Relation Age of Onset   Diabetes Mother    Hypertension Mother    Colon cancer Neg Hx    Esophageal cancer Neg Hx    Pancreatic cancer Neg Hx    Rectal cancer Neg Hx    Stomach cancer Neg Hx     Social History   Socioeconomic History   Marital status: Single    Spouse name: Not on file   Number of children: Not on file   Years of education: Not on file   Highest education level: Not on file  Occupational History   Not on file  Tobacco Use   Smoking status: Every Day    Current packs/day: 0.25    Types: Cigarettes   Smokeless tobacco: Never   Tobacco comments:    patient is trying EOD  Substance and Sexual Activity   Alcohol use: No   Drug use: No   Sexual activity: Not Currently    Birth control/protection: Surgical  Other Topics Concern   Not on file  Social History Narrative   Not on file   Social Drivers of Health   Financial Resource Strain: Low Risk  (05/01/2023)   Overall Financial Resource Strain (CARDIA)  Difficulty of Paying Living Expenses: Not hard at all  Food Insecurity: No Food Insecurity (05/01/2023)   Hunger Vital Sign    Worried About Running Out of Food in the Last Year: Never true    Ran Out of Food in the Last Year: Never true  Transportation Needs: No Transportation Needs (05/01/2023)   PRAPARE - Administrator, Civil Service (Medical): No    Lack of Transportation (Non-Medical): No  Physical Activity: Sufficiently Active (05/01/2023)   Exercise Vital Sign    Days of Exercise per Week: 5 days    Minutes of Exercise per Session: 30 min  Stress: No Stress Concern Present  (05/01/2023)   [employer] of Occupational Health - Occupational Stress Questionnaire    Feeling of Stress : Not at all  Social Connections: Moderately Isolated (05/01/2023)   Social Connection and Isolation Panel [NHANES]    Frequency of Communication with Friends and Family: More than three times a week    Frequency of Social Gatherings with Friends and Family: Three times a week    Attends Religious Services: 1 to 4 times per year    Active Member of Clubs or Organizations: No    Attends Banker Meetings: Never    Marital Status: Divorced    No Known Allergies  Outpatient Medications Prior to Visit  Medication Sig Dispense Refill   Accu-Chek Softclix Lancets lancets USE AS INSTRUCTED 200 each 3   aspirin EC 81 MG tablet Take 1 tablet (81 mg total) by mouth daily. Swallow whole. 120 tablet 3   Blood Glucose Monitoring Suppl (ACCU-CHEK GUIDE ME) w/Device KIT 1 each by Does not apply route 3 (three) times daily before meals. 1 kit 0   glucose blood (ACCU-CHEK GUIDE) test strip USE AS INSTRUCTED 1 TO 2 TIMES PER DAY 200 strip 3   Misc. Devices MISC Blood pressure monitor; diagnosis-hypertension 1 each 0   amLODipine (NORVASC) 10 MG tablet TAKE 1 TABLET EVERY DAY 90 tablet 0   Dulaglutide (TRULICITY) 0.75 MG/0.5ML SOPN Inject 0.75 mg into the skin once a week. 6 mL 5   glipiZIDE (GLUCOTROL) 10 MG tablet TAKE 1 TABLET TWICE DAILY BEFORE MEALS 180 tablet 0   hydrochlorothiazide (HYDRODIURIL) 25 MG tablet TAKE 1 TABLET EVERY DAY 30 tablet 0   isosorbide mononitrate (IMDUR) 60 MG 24 hr tablet TAKE 1 TABLET EVERY DAY 30 tablet 0   simvastatin (ZOCOR) 10 MG tablet TAKE 1 TABLET AT BEDTIME 30 tablet 0   No facility-administered medications prior to visit.     ROS Review of Systems  Constitutional:  Negative for activity change and appetite change.  HENT:  Negative for sinus pressure and sore throat.   Respiratory:  Negative for chest tightness, shortness of breath and  wheezing.   Cardiovascular:  Negative for chest pain and palpitations.  Gastrointestinal:  Negative for abdominal distention, abdominal pain and constipation.  Genitourinary: Negative.   Musculoskeletal: Negative.   Psychiatric/Behavioral:  Negative for behavioral problems and dysphoric mood.     Objective:  BP 129/61   Pulse 67   Ht 5\' 1"  (1.549 m)   Wt 145 lb 12.8 oz (66.1 kg)   SpO2 98%   BMI 27.55 kg/m      03/06/2024   11:08 AM 03/06/2024   10:38 AM 07/24/2023    8:46 AM  BP/Weight  Systolic BP 129 143 135  Diastolic BP 61 63 54  Wt. (Lbs)  145.8 142.2  BMI  27.55 kg/m2 26.87  kg/m2      Physical Exam Constitutional:      Appearance: She is well-developed.  Cardiovascular:     Rate and Rhythm: Normal rate.     Heart sounds: Normal heart sounds. No murmur heard. Pulmonary:     Effort: Pulmonary effort is normal.     Breath sounds: Normal breath sounds. No wheezing or rales.  Chest:     Chest wall: No tenderness.  Abdominal:     General: Bowel sounds are normal. There is no distension.     Palpations: Abdomen is soft. There is no mass.     Tenderness: There is no abdominal tenderness.  Musculoskeletal:        General: Normal range of motion.     Right lower leg: No edema.     Left lower leg: No edema.  Neurological:     Mental Status: She is alert and oriented to person, place, and time.  Psychiatric:        Mood and Affect: Mood normal.        Latest Ref Rng & Units 07/24/2023    9:43 AM 01/23/2023    9:03 AM 07/20/2022    9:15 AM  CMP  Glucose 70 - 99 mg/dL 161  73  97   BUN 8 - 27 mg/dL 56  33  35   Creatinine 0.57 - 1.00 mg/dL 0.96  0.45  4.09   Sodium 134 - 144 mmol/L 141  143  142   Potassium 3.5 - 5.2 mmol/L 4.3  4.2  4.3   Chloride 96 - 106 mmol/L 98  106  102   CO2 20 - 29 mmol/L 27  22  23    Calcium 8.7 - 10.3 mg/dL 9.1  9.5  9.6   Total Protein 6.0 - 8.5 g/dL 7.2  7.4  7.5   Total Bilirubin 0.0 - 1.2 mg/dL 0.2  0.2  0.3   Alkaline Phos 44  - 121 IU/L 70  68  60   AST 0 - 40 IU/L 20  19  17    ALT 0 - 32 IU/L 10  9  7      Lipid Panel     Component Value Date/Time   CHOL 141 07/24/2023 0943   TRIG 61 07/24/2023 0943   HDL 54 07/24/2023 0943   CHOLHDL 2.6 07/24/2023 0943   CHOLHDL 3.7 10/05/2016 1048   VLDL 19 10/05/2016 1048   LDLCALC 74 07/24/2023 0943   LDLDIRECT 40 09/09/2009 2145    CBC    Component Value Date/Time   WBC 7.3 07/24/2023 0943   WBC 5.5 10/05/2016 1048   RBC 3.96 07/24/2023 0943   RBC 3.97 10/05/2016 1048   HGB 11.1 07/24/2023 0943   HCT 33.4 (L) 07/24/2023 0943   PLT 252 07/24/2023 0943   MCV 84 07/24/2023 0943   MCH 28.0 07/24/2023 0943   MCH 28.2 10/05/2016 1048   MCHC 33.2 07/24/2023 0943   MCHC 32.7 10/05/2016 1048   RDW 14.0 07/24/2023 0943   LYMPHSABS 2.4 07/24/2023 0943   MONOABS 275 10/05/2016 1048   EOSABS 0.3 07/24/2023 0943   BASOSABS 0.0 07/24/2023 0943    Lab Results  Component Value Date   HGBA1C 6.6 03/06/2024       Assessment & Plan Type 2 diabetes mellitus A1c at 6.6 indicates good control. Financial challenges with Trulicity - Check with pharmacist regarding cost and insurance coverage of Trulicity. - If unaffordable, discontinue Trulicity and continue glipizide 10 mg twice daily. -  Pharmacist has verified that there is no medication assistance for Trulicity but there is a medication assistance program for Ozempic.  The patient has been notified after discharge by the pharmacist said that we will be switching her to Ozempic 0.25 mg - Schedule follow-up in 3 months to monitor blood glucose levels. -Counseled on Diabetic diet, my plate method, 469 minutes of moderate intensity exercise/week Blood sugar logs with fasting goals of 80-120 mg/dl, random of less than 629 and in the event of sugars less than 60 mg/dl or greater than 528 mg/dl encouraged to notify the clinic. Advised on the need for annual eye exams, annual foot exams, Pneumonia  vaccine.   Hypertension Blood pressure at 143/63 mmHg. Adhering to medication regimen. -Repeat blood pressure is normal - Continue current antihypertensive medications. -Counseled on blood pressure goal of less than 130/80, low-sodium, DASH diet, medication compliance, 150 minutes of moderate intensity exercise per week. Discussed medication compliance, adverse effects.   Chronic kidney disease stage 4 Stage 4 CKD. Missed nephrology appointment. Advised to avoid NSAIDs. - Order blood tests to check kidney function. - Advise to avoid NSAIDs like Aleve and ibuprofen. - Encourage rescheduling of nephrology appointment.  Dyslipidemia On simvastatin 10 mg. No recent lipid panel results discussed. - Order blood tests to check cholesterol levels. -Low-cholesterol diet  Smoking Continues to smoke. Concerned about weight gain if quitting. - Encourage smoking cessation and discuss potential weight management strategies.        Meds ordered this encounter  Medications   amLODipine (NORVASC) 10 MG tablet    Sig: Take 1 tablet (10 mg total) by mouth daily.    Dispense:  90 tablet    Refill:  1   glipiZIDE (GLUCOTROL) 10 MG tablet    Sig: Take 1 tablet (10 mg total) by mouth 2 (two) times daily before a meal.    Dispense:  180 tablet    Refill:  1   hydrochlorothiazide (HYDRODIURIL) 25 MG tablet    Sig: Take 1 tablet (25 mg total) by mouth daily.    Dispense:  90 tablet    Refill:  1   isosorbide mononitrate (IMDUR) 60 MG 24 hr tablet    Sig: Take 1 tablet (60 mg total) by mouth daily.    Dispense:  90 tablet    Refill:  1   simvastatin (ZOCOR) 10 MG tablet    Sig: Take 1 tablet (10 mg total) by mouth at bedtime.    Dispense:  90 tablet    Refill:  1   Semaglutide,0.25 or 0.5MG /DOS, (OZEMPIC, 0.25 OR 0.5 MG/DOSE,) 2 MG/3ML SOPN    Sig: Inject 0.25 mg into the skin once a week.    Dispense:  3 mL    Refill:  6    Follow-up: Return in about 6 months (around 09/05/2024) for  Chronic medical conditions.       Hoy Register, MD, FAAFP. [facility] [place], [place] [phone]   03/06/2024, 2:04 PM

## 2024-03-06 NOTE — Patient Instructions (Signed)

## 2024-03-07 ENCOUNTER — Other Ambulatory Visit: Payer: Self-pay

## 2024-03-07 LAB — LP+NON-HDL CHOLESTEROL
Cholesterol, Total: 157 mg/dL (ref 100–199)
HDL: 62 mg/dL (ref >39)
LDL Chol Calc (NIH): 82 mg/dL (ref 0–99)
Total Non-HDL-Chol (LDL+VLDL): 95 mg/dL (ref 0–129)
Triglycerides: 66 mg/dL (ref 0–149)
VLDL Cholesterol Cal: 13 mg/dL (ref 5–40)

## 2024-03-07 LAB — CMP14+EGFR
ALT: 8 IU/L (ref 0–32)
AST: 18 IU/L (ref 0–40)
Albumin: 4.6 g/dL (ref 3.8–4.8)
Alkaline Phosphatase: 69 IU/L (ref 44–121)
BUN/Creatinine Ratio: 22 (ref 12–28)
BUN: 45 mg/dL — ABNORMAL HIGH (ref 8–27)
Bilirubin Total: 0.2 mg/dL (ref 0.0–1.2)
CO2: 22 mmol/L (ref 20–29)
Calcium: 9.5 mg/dL (ref 8.7–10.3)
Chloride: 106 mmol/L (ref 96–106)
Creatinine, Ser: 2.06 mg/dL — ABNORMAL HIGH (ref 0.57–1.00)
Globulin, Total: 2.9 g/dL (ref 1.5–4.5)
Glucose: 113 mg/dL — ABNORMAL HIGH (ref 70–99)
Potassium: 4.7 mmol/L (ref 3.5–5.2)
Sodium: 144 mmol/L (ref 134–144)
Total Protein: 7.5 g/dL (ref 6.0–8.5)
eGFR: 25 mL/min/{1.73_m2} — ABNORMAL LOW (ref >59)

## 2024-03-07 LAB — MICROALBUMIN / CREATININE URINE RATIO
Creatinine, Urine: 89.2 mg/dL
Microalb/Creat Ratio: 8 mg/g{creat} (ref 0–29)
Microalbumin, Urine: 7.3 ug/mL

## 2024-03-07 LAB — FRUCTOSAMINE: Fructosamine: 288 umol/L — ABNORMAL HIGH (ref 0–285)

## 2024-03-10 ENCOUNTER — Other Ambulatory Visit: Payer: Self-pay

## 2024-03-10 NOTE — Telephone Encounter (Signed)
 Pt called back and verbalized understanding of information below. Plans to pick it up from the pharmacy.

## 2024-03-11 ENCOUNTER — Other Ambulatory Visit: Payer: Self-pay

## 2024-03-13 ENCOUNTER — Ambulatory Visit: Payer: Self-pay

## 2024-03-13 NOTE — Telephone Encounter (Signed)
 Noted. Patient has been called

## 2024-03-13 NOTE — Telephone Encounter (Signed)
 This RN returned the patient's call. Patient denies have any symptoms at this time. Patient states she was trying to get in touch with a nurse at her PCP office. Patient states she has already spoke to that nurse and has all the information she needs. Nothing further needed at this time.  Copied From CRM [id number]. Reason for Triage: Patient called to speak to her nurse did not want to verify any info with me was very rude and said she was receiving a call so she disconnected. She did not want to verify what she needed to speak to her nurse about stated that she does not need to and I just advised that I just need to know what the call is in regards to and to see if she is experiencing any current symptoms but she did not what to share and was very rude.  ----- Message from [facility] P sent at 03/13/2024  3:10 PM EDT ----- Copied From CRM [id number]. Reason for Triage: Patient called to speak to her nurse did not want to verify any info with me was very rude and said she was receiving a call so she disconnected. She did not want to verify what she needed to speak to her nurse about stated that she does not need to and I just advised that I just need to know what the call is in regards to and to see if she is experiencing any current symptoms but she did not what to share and was very rude.

## 2024-03-27 ENCOUNTER — Ambulatory Visit
Admission: RE | Admit: 2024-03-27 | Discharge: 2024-03-27 | Disposition: A | Discharge status: Home / Self Care | Payer: Medicare HMO | Source: Ambulatory Visit | Attending: Family Medicine | Admitting: Family Medicine

## 2024-03-27 DIAGNOSIS — Z1231 Encounter for screening mammogram for malignant neoplasm of breast: Secondary | ICD-10-CM | POA: Diagnosis not present

## 2024-03-31 ENCOUNTER — Other Ambulatory Visit (HOSPITAL_COMMUNITY): Payer: Self-pay

## 2024-04-07 ENCOUNTER — Telehealth: Payer: Self-pay

## 2024-04-07 ENCOUNTER — Other Ambulatory Visit: Payer: Self-pay

## 2024-04-07 MED ORDER — OZEMPIC (0.25 OR 0.5 MG/DOSE) 2 MG/3ML ~~LOC~~ SOPN: 0.2500 mg | PEN_INJECTOR | SUBCUTANEOUS | 6 refills | Status: DC

## 2024-04-07 NOTE — Telephone Encounter (Signed)
 Copied from CRM [id number]. Topic: General - Other >> Apr 07, 2024  4:07 PM Felizardo Hotter wrote: Reason for CRM: Pt called will not discuss subject with me. Please call pt [phone]

## 2024-04-07 NOTE — Telephone Encounter (Signed)
Pt call returned.

## 2024-04-07 NOTE — Telephone Encounter (Signed)
 Patient was called and she states that her Ozempic  is free through [employer], medication has been sent to [facility]

## 2024-04-25 ENCOUNTER — Other Ambulatory Visit: Payer: Self-pay

## 2024-05-06 ENCOUNTER — Ambulatory Visit: Payer: Medicare HMO | Attending: Family Medicine

## 2024-05-06 VITALS — Ht 61.0 in | Wt 142.0 lb

## 2024-05-06 DIAGNOSIS — Z1211 Encounter for screening for malignant neoplasm of colon: Secondary | ICD-10-CM

## 2024-05-06 DIAGNOSIS — Z0000 Encounter for general adult medical examination without abnormal findings: Secondary | ICD-10-CM | POA: Diagnosis not present

## 2024-05-06 NOTE — Progress Notes (Addendum)
 Because this visit was a virtual/telehealth visit,  certain criteria was not obtained, such a blood pressure, CBG if applicable, and timed get up and go. Any medications not marked as "taking" were not mentioned during the medication reconciliation part of the visit. Any vitals not documented were not able to be obtained due to this being a telehealth visit or patient was unable to self-report a recent blood pressure reading due to a lack of equipment at home via telehealth. Vitals that have been documented are verbally provided by the patient.   Subjective:   Andrea Brown is a 73 y.o. who presents for a Medicare Wellness preventive visit.  As a reminder, Annual Wellness Visits don't include a physical exam, and some assessments may be limited, especially if this visit is performed virtually. We may recommend an in-person follow-up visit with your provider if needed.  Visit Complete: Virtual I connected with  Starlyn Droge Rice on 05/06/24 by a audio enabled telemedicine application and verified that I am speaking with the correct person using two identifiers.  Patient Location: Home  Provider Location: Office/Clinic  I discussed the limitations of evaluation and management by telemedicine. The patient expressed understanding and agreed to proceed.  Vital Signs: Because this visit was a virtual/telehealth visit, some criteria may be missing or patient reported. Any vitals not documented were not able to be obtained and vitals that have been documented are patient reported.  VideoDeclined- This patient declined Librarian, academic. Therefore the visit was completed with audio only.  Persons Participating in Visit: Patient.  AWV Questionnaire: No: Patient Medicare AWV questionnaire was not completed prior to this visit.  Cardiac Risk Factors include: advanced age (>68men, >4 women);dyslipidemia;diabetes mellitus;hypertension;smoking/ tobacco exposure      Objective:     Today's Vitals   05/06/24 0902  Weight: 142 lb (64.4 kg)  Height: 5\' 1"  (1.549 m)  PainSc: 0-No pain   Body mass index is 26.83 kg/m.     05/06/2024    9:05 AM 05/01/2023    8:36 AM 07/18/2022   10:31 AM 05/24/2020   10:02 AM 02/23/2020   11:07 AM 02/13/2020    3:30 PM 10/05/2016    9:59 AM  Advanced Directives  Does Patient Have a Medical Advance Directive? No No No No No No No  Would patient like information on creating a medical advance directive? No - Patient declined Yes (MAU/Ambulatory/Procedural Areas - Information given) Yes (ED - Information included in AVS)  No - Patient declined No - Patient declined No - patient declined information    Current Medications (verified) Outpatient Encounter Medications as of 05/06/2024  Medication Sig   Accu-Chek Softclix Lancets lancets USE AS INSTRUCTED   amLODipine  (NORVASC ) 10 MG tablet Take 1 tablet (10 mg total) by mouth daily.   aspirin  EC 81 MG tablet Take 1 tablet (81 mg total) by mouth daily. Swallow whole.   Blood Glucose Monitoring Suppl (ACCU-CHEK GUIDE ME) w/Device KIT 1 each by Does not apply route 3 (three) times daily before meals.   glipiZIDE  (GLUCOTROL ) 10 MG tablet Take 1 tablet (10 mg total) by mouth 2 (two) times daily before a meal.   glucose blood (ACCU-CHEK GUIDE) test strip USE AS INSTRUCTED 1 TO 2 TIMES PER DAY   hydrochlorothiazide  (HYDRODIURIL ) 25 MG tablet Take 1 tablet (25 mg total) by mouth daily.   isosorbide  mononitrate (IMDUR ) 60 MG 24 hr tablet Take 1 tablet (60 mg total) by mouth daily.  Misc. Devices MISC Blood pressure monitor; diagnosis-hypertension   Semaglutide ,0.25 or 0.5MG /DOS, (OZEMPIC , 0.25 OR 0.5 MG/DOSE,) 2 MG/3ML SOPN Inject 0.25 mg into the skin once a week.   simvastatin  (ZOCOR ) 10 MG tablet Take 1 tablet (10 mg total) by mouth at bedtime.   No facility-administered encounter medications on file as of 05/06/2024.    Allergies (verified) Patient has no known allergies.    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   Diabetes mellitus without complication (HCC)    Hyperlipidemia    Hypertension    Papilloma of right breast    Smoker    Past Surgical History:  Procedure Laterality Date   BREAST EXCISIONAL BIOPSY     BREAST LUMPECTOMY WITH RADIOACTIVE SEED LOCALIZATION Right 02/23/2020   Procedure: RIGHT BREAST LUMPECTOMY X 3 WITH RADIOACTIVE SEED LOCALIZATION;  Surgeon: Caralyn Chandler, MD;  Location: [facility];  Service: General;  Laterality: Right;   TOTAL ABDOMINAL HYSTERECTOMY  2000   Family History  Problem Relation Age of Onset   Diabetes Mother    Hypertension Mother    Colon cancer Neg Hx    Esophageal cancer Neg Hx    Pancreatic cancer Neg Hx    Rectal cancer Neg Hx    Stomach cancer Neg Hx    Social History   Socioeconomic History   Marital status: Single    Spouse name: Not on file   Number of children: Not on file   Years of education: Not on file   Highest education level: Not on file  Occupational History   Not on file  Tobacco Use   Smoking status: Every Day    Current packs/day: 0.25    Types: Cigarettes   Smokeless tobacco: Never   Tobacco comments:    patient is trying EOD  Substance and Sexual Activity   Alcohol use: No   Drug use: No   Sexual activity: Not Currently    Birth control/protection: Surgical  Other Topics Concern   Not on file  Social History Narrative   Not on file   Social Drivers of Health   Financial Resource Strain: Low Risk  (05/06/2024)   Overall Financial Resource Strain (CARDIA)    Difficulty of Paying Living Expenses: Not hard at all  Food Insecurity: No Food Insecurity (05/06/2024)   Hunger Vital Sign    Worried About Running Out of Food in the Last Year: Never true    Ran Out of Food in the Last Year: Never true  Transportation Needs: No Transportation Needs (05/06/2024)   PRAPARE - Scientist, research (physical sciences) (Medical): No    Lack of Transportation (Non-Medical): No  Physical Activity: Sufficiently Active (05/06/2024)   Exercise Vital Sign    Days of Exercise per Week: 5 days    Minutes of Exercise per Session: 30 min  Stress: No Stress Concern Present (05/06/2024)   [employer] of Occupational Health - Occupational Stress Questionnaire    Feeling of Stress : Not at all  Social Connections: Moderately Isolated (05/06/2024)   Social Connection and Isolation Panel [NHANES]    Frequency of Communication with Friends and Family: More than three times a week    Frequency of Social Gatherings with Friends and Family: Three times a week    Attends Religious Services: 1 to 4 times per year    Active Member of Clubs or Organizations: No    Attends [employer]  or Organization Meetings: Never    Marital Status: Divorced    Tobacco Counseling Ready to quit: Not Answered Counseling given: Not Answered Tobacco comments: patient is trying EOD    Clinical Intake:     Pain Score: 0-No pain     BMI - recorded: 26.83 Nutritional Status: BMI 25 -29 Overweight Nutritional Risks: None Diabetes: Yes CBG done?: No Did pt. bring in CBG monitor from home?: No  Lab Results  Component Value Date   HGBA1C 6.6 03/06/2024   HGBA1C 5.9 (H) 07/24/2023   HGBA1C 6.0 01/23/2023     How often do you need to have someone help you when you read instructions, pamphlets, or other written materials from your doctor or pharmacy?: 1 - Never  Interpreter Needed?: No  Information entered by :: Amanie Mcculley N. Randi Poullard, LPN.   Activities of Daily Living     05/06/2024    9:08 AM  In your present state of health, do you have any difficulty performing the following activities:  Hearing? 0  Vision? 0  Difficulty concentrating or making decisions? 0  Walking or climbing stairs? 0  Dressing or bathing? 0  Doing errands, shopping? 0  Preparing Food and eating ? N  Using the Toilet? N  In the past six  months, have you accidently leaked urine? N  Do you have problems with loss of bowel control? N  Managing your Medications? N  Managing your Finances? N  Housekeeping or managing your Housekeeping? N    Patient Care Team: Joaquin Mulberry, MD as PCP - General (Family Medicine) Goldsborough, Kellie, MD as Consulting Physician (Nephrology)  I have updated your Care Teams any recent Medical Services you may have received from other providers in the past year.     Assessment:    This is a routine wellness examination for [place].  Hearing/Vision screen Hearing Screening - Comments:: Adequate hearing. Vision Screening - Comments:: Patient is scheduled for [facility] in August for routine eye exam.   Goals Addressed             This Visit's Progress    05/06/2024: Maintain my health by staying independent, physically active and socially involved.         Depression Screen     05/06/2024    9:09 AM 03/06/2024   10:39 AM 07/24/2023    8:50 AM 05/01/2023    8:38 AM 07/18/2022   10:32 AM 01/19/2022    9:20 AM 05/24/2020   10:02 AM  PHQ 2/9 Scores  PHQ - 2 Score 0 0 0 0 0 0 0  PHQ- 9 Score 0 0 0   0     Fall Risk     05/06/2024    9:08 AM 03/06/2024   10:39 AM 07/24/2023    8:50 AM 05/01/2023    8:35 AM 01/23/2023    8:39 AM  Fall Risk   Falls in the past year? 0 0 0 0 0  Number falls in past yr: 0 0 0 0 0  Injury with Fall? 0 0 0 0 0  Risk for fall due to : No Fall Risks No Fall Risks  No Fall Risks   Follow up Falls evaluation completed Falls evaluation completed  Falls prevention discussed;Education provided;Falls evaluation completed     MEDICARE RISK AT HOME:  Medicare Risk at Home Any stairs in or around the home?: No If so, are there any without handrails?: No Home free of loose throw rugs in walkways, pet beds,  electrical cords, etc?: Yes Adequate lighting in your home to reduce risk of falls?: Yes Life alert?: No (uses phone) Use of a cane, walker or w/c?:  No Grab bars in the bathroom?: No Shower chair or bench in shower?: No Elevated toilet seat or a handicapped toilet?: No  TIMED UP AND GO:  Was the test performed?  No  Cognitive Function: Declined/Normal: No cognitive concerns noted by patient or family. Patient alert, oriented, able to answer questions appropriately and recall recent events. No signs of memory loss or confusion.    05/06/2024    9:09 AM 05/24/2020   10:14 AM  MMSE - Mini Mental State Exam  Not completed: Unable to complete   Orientation to time  5  Orientation to Place  5  Registration  3  Attention/ Calculation  5  Recall  3  Language- name 2 objects  2  Language- repeat  1  Language- follow 3 step command  3  Language- read & follow direction  1  Write a sentence  1  Copy design  1  Total score  30        05/06/2024    9:04 AM 05/01/2023    8:36 AM 07/18/2022   10:33 AM  6CIT Screen  What Year? 0 points 0 points 0 points  What month? 0 points 0 points 0 points  What time? 0 points 0 points 0 points  Count back from 20 0 points 0 points 0 points  Months in reverse 0 points 0 points 0 points  Repeat phrase 0 points 0 points 0 points  Total Score 0 points 0 points 0 points    Immunizations Immunization History  Administered Date(s) Administered   PFIZER(Purple Top)SARS-COV-2 Vaccination 03/25/2020, 04/19/2020, 01/03/2021, 04/20/2021, 12/21/2021   Pfizer(Comirnaty)Fall Seasonal Vaccine 12 years and older 09/18/2023   Pneumococcal Conjugate-13 04/22/2018   Pneumococcal Polysaccharide-23 05/24/2020   Tdap 05/24/2020    Screening Tests Health Maintenance  Topic Date Due   DEXA SCAN  Never done   OPHTHALMOLOGY EXAM  08/17/2022   COVID-19 Vaccine (7 - 2024-25 season) 03/18/2024   Colonoscopy  08/19/2024   Zoster Vaccines- Shingrix (1 of 2) 06/05/2024 (Originally 01/25/1999)   INFLUENZA VACCINE  07/04/2024   HEMOGLOBIN A1C  09/05/2024   Diabetic kidney evaluation - eGFR measurement  03/06/2025    Diabetic kidney evaluation - Urine ACR  03/06/2025   FOOT EXAM  03/06/2025   Medicare Annual Wellness (AWV)  05/06/2025   DTaP/Tdap/Td (2 - Td or Tdap) 05/24/2030   Pneumonia Vaccine 32+ Years old  Completed   Hepatitis C Screening  Completed   HPV VACCINES  Aged Out   Meningococcal B Vaccine  Aged Out    Health Maintenance  Health Maintenance Due  Topic Date Due   DEXA SCAN  Never done   OPHTHALMOLOGY EXAM  08/17/2022   COVID-19 Vaccine (7 - 2024-25 season) 03/18/2024   Colonoscopy  08/19/2024   Health Maintenance Items Addressed: Yes Referral sent to GI for colonoscopy  Additional Screening:  Vision Screening: Recommended annual ophthalmology exams for early detection of glaucoma and other disorders of the eye. Would you like a referral to an eye doctor? No  Patient schedule for August 2025.  Dental Screening: Recommended annual dental exams for proper oral hygiene  Community Resource Referral / Chronic Care Management: CRR required this visit?  No   CCM required this visit?  No   Plan:    I have personally reviewed and noted the following  in the patient's chart:   Medical and social history Use of alcohol, tobacco or illicit drugs  Current medications and supplements including opioid prescriptions. Patient is not currently taking opioid prescriptions. Functional ability and status Nutritional status Physical activity Advanced directives List of other physicians Hospitalizations, surgeries, and ER visits in previous 12 months Vitals Screenings to include cognitive, depression, and falls Referrals and appointments  In addition, I have reviewed and discussed with patient certain preventive protocols, quality metrics, and best practice recommendations. A written personalized care plan for preventive services as well as general preventive health recommendations were provided to patient.   Margette Sheldon, LPN   12/09/1094   After Visit Summary: (Mail) Due to  this being a telephonic visit, the after visit summary with patients personalized plan was offered to patient via mail   Notes: Patient aware of current care gaps.  Immunization record was verified by [employer].  An order was placed to [facility] to schedule a screening colonoscopy for this patient.  Patient declined bone density scan and vaccines.

## 2024-05-06 NOTE — Patient Instructions (Signed)
 Ms. Andrea Brown , Thank you for taking time out of your busy schedule to complete your Annual Wellness Visit with me. I enjoyed our conversation and look forward to speaking with you again next year. I, as well as your care team,  appreciate your ongoing commitment to your health goals. Please review the following plan we discussed and let me know if I can assist you in the future. Your Game plan/ To Do List    Referrals: If you haven't heard from the office you've been referred to, please reach out to them at the phone provided.  [facility] [address] [place],  [place]  [address]  [phone] Re: Screening Colonoscopy  Follow up Visits: Next Medicare AWV with our clinical staff: 05/12/2025 at 8:30 a.m. Phone Visit with Nurse Health Advisor   Have you seen your provider in the last 6 months (3 months if uncontrolled diabetes)? Yes Next Office Visit with your provider: 09/08/2024 at 9:10 a.m. Office Visit with Dr. Adan Holms  Clinician Recommendations:  Aim for 30 minutes of exercise or brisk walking, 6-8 glasses of water, and 5 servings of fruits and vegetables each day.       This is a list of the screening recommended for you and due dates:  Health Maintenance  Topic Date Due   DEXA scan (bone density measurement)  Never done   Eye exam for diabetics  08/17/2022   COVID-19 Vaccine (4 - 2024-25 season) 08/05/2023   Colon Cancer Screening  08/19/2024   Zoster (Shingles) Vaccine (1 of 2) 06/05/2024*   Flu Shot  07/04/2024   Hemoglobin A1C  09/05/2024   Yearly kidney function blood test for diabetes  03/06/2025   Yearly kidney health urinalysis for diabetes  03/06/2025   Complete foot exam   03/06/2025   Medicare Annual Wellness Visit  05/06/2025   DTaP/Tdap/Td vaccine (2 - Td or Tdap) 05/24/2030   Pneumonia Vaccine  Completed   Hepatitis C Screening  Completed   HPV Vaccine  Aged Out   Meningitis B Vaccine  Aged Out  *Topic was postponed. The date  shown is not the original due date.    Advanced directives: (Declined) Advance directive discussed with you today. Even though you declined this today, please call our office should you change your mind, and we can give you the proper paperwork for you to fill out. Advance Care Planning is important because it:  [x]  Makes sure you receive the medical care that is consistent with your values, goals, and preferences  [x]  It provides guidance to your family and loved ones and reduces their decisional burden about whether or not they are making the right decisions based on your wishes.  Follow the link provided in your after visit summary or read over the paperwork we have mailed to you to help you started getting your Advance Directives in place. If you need assistance in completing these, please reach out to us  so that we can help you!  See attachments for Preventive Care and Fall Prevention Tips.

## 2024-07-18 DIAGNOSIS — H52203 Unspecified astigmatism, bilateral: Secondary | ICD-10-CM | POA: Diagnosis not present

## 2024-07-18 DIAGNOSIS — H25813 Combined forms of age-related cataract, bilateral: Secondary | ICD-10-CM | POA: Diagnosis not present

## 2024-07-18 DIAGNOSIS — E119 Type 2 diabetes mellitus without complications: Secondary | ICD-10-CM | POA: Diagnosis not present

## 2024-07-18 DIAGNOSIS — H5213 Myopia, bilateral: Secondary | ICD-10-CM | POA: Diagnosis not present

## 2024-07-28 ENCOUNTER — Encounter: Payer: Self-pay | Admitting: Family Medicine

## 2024-08-07 DIAGNOSIS — H2513 Age-related nuclear cataract, bilateral: Secondary | ICD-10-CM | POA: Diagnosis not present

## 2024-08-07 DIAGNOSIS — E119 Type 2 diabetes mellitus without complications: Secondary | ICD-10-CM | POA: Diagnosis not present

## 2024-08-07 DIAGNOSIS — H25013 Cortical age-related cataract, bilateral: Secondary | ICD-10-CM | POA: Diagnosis not present

## 2024-08-24 ENCOUNTER — Other Ambulatory Visit: Payer: Self-pay | Admitting: Family Medicine

## 2024-08-24 DIAGNOSIS — E1121 Type 2 diabetes mellitus with diabetic nephropathy: Secondary | ICD-10-CM

## 2024-08-24 DIAGNOSIS — E1122 Type 2 diabetes mellitus with diabetic chronic kidney disease: Secondary | ICD-10-CM

## 2024-08-26 NOTE — Telephone Encounter (Signed)
 Requested medication (s) are due for refill today: yes  Requested medication (s) are on the active medication list: yes  Last refill:  03/06/24 #180 1 RF  Future visit scheduled: yes  Notes to clinic:  Cr outside normal range   Requested Prescriptions  Pending Prescriptions Disp Refills   amLODipine  (NORVASC ) 10 MG tablet [Pharmacy Med Name: AMLODIPINE  BESYLATE 10 MG Oral Tablet] 90 tablet     Sig: TAKE 1 TABLET EVERY DAY     Cardiovascular: Calcium Channel Blockers 2 Passed - 08/26/2024  7:49 AM      Passed - Last BP in normal range    BP Readings from Last 1 Encounters:  03/06/24 129/61         Passed - Last Heart Rate in normal range    Pulse Readings from Last 1 Encounters:  03/06/24 67         Passed - Valid encounter within last 6 months    Recent Outpatient Visits           5 months ago Type 2 diabetes mellitus with diabetic nephropathy, without long-term current use of insulin  (HCC)   [facility] [place]. [facility] Delbert Clam, MD   1 year ago Type 2 diabetes mellitus with diabetic nephropathy, without long-term current use of insulin  [facility])   [facility] [place]. [facility] [place], [place], [place]   1 year ago Type 2 diabetes mellitus with diabetic nephropathy, without long-term current use of insulin  [facility])   [facility] [place]. [facility] Delbert Clam, MD   2 years ago Type 2 diabetes mellitus with diabetic nephropathy, without long-term current use of insulin  [facility])   [facility] [place]. [facility] Delbert Clam, MD   2 years ago Type 2 diabetes mellitus with diabetic nephropathy, without long-term current use of insulin  [facility])   [facility] Of [place]. [facility] [place], [place], PA-C              Signed Prescriptions Disp Refills    glipiZIDE  (GLUCOTROL ) 10 MG tablet 180 tablet 0    Sig: TAKE 1 TABLET TWICE DAILY BEFORE MEALS     Endocrinology:  Diabetes - Sulfonylureas Failed - 08/26/2024  7:49 AM      Failed - Cr in normal range and within 360 days    Creat  Date Value Ref Range Status  10/05/2016 2.31 (H) 0.50 - 0.99 mg/dL Final    Comment:      For patients > or = 75 years of age: The upper reference limit for Creatinine is approximately 13% higher for people identified as African-American.      Creatinine, Ser  Date Value Ref Range Status  03/06/2024 2.06 (H) 0.57 - 1.00 mg/dL Final   Creatinine, POC  Date Value Ref Range Status  09/11/2023 77.0 mg/dL Final    Comment:    Abstracted by  HIM   Creatinine, Urine  Date Value Ref Range Status  10/05/2016 116 20 - 320 mg/dL Final         Passed - HBA1C is between 0 and 7.9 and within 180 days    HbA1c, POC (controlled diabetic range)  Date Value Ref Range Status  03/06/2024 6.6 0.0 - 7.0 % Final  Passed - Valid encounter within last 6 months    Recent Outpatient Visits           5 months ago Type 2 diabetes mellitus with diabetic nephropathy, without long-term current use of insulin  (HCC)   [facility] [place]. [facility] Delbert Clam, MD   1 year ago Type 2 diabetes mellitus with diabetic nephropathy, without long-term current use of insulin  [facility])   [facility] [place]. [facility] [place], [place], [place]   1 year ago Type 2 diabetes mellitus with diabetic nephropathy, without long-term current use of insulin  [facility])   [facility] [facility] [place]. [facility] Delbert Clam, MD   2 years ago Type 2 diabetes mellitus with diabetic nephropathy, without long-term current use of insulin  [facility])   [place] [facility] Of [place]. [facility] Delbert Clam, MD   2 years ago Type 2 diabetes  mellitus with diabetic nephropathy, without long-term current use of insulin  [facility])   [place] [facility] [place]. [facility] [place], [place], PA-C

## 2024-08-26 NOTE — Telephone Encounter (Signed)
 Requested Prescriptions  Pending Prescriptions Disp Refills   glipiZIDE  (GLUCOTROL ) 10 MG tablet [Pharmacy Med Name: GLIPIZIDE  10 MG Oral Tablet] 180 tablet 0    Sig: TAKE 1 TABLET TWICE DAILY BEFORE MEALS     Endocrinology:  Diabetes - Sulfonylureas Failed - 08/26/2024  7:48 AM      Failed - Cr in normal range and within 360 days    Creat  Date Value Ref Range Status  10/05/2016 2.31 (H) 0.50 - 0.99 mg/dL Final    Comment:      For patients > or = 75 years of age: The upper reference limit for Creatinine is approximately 13% higher for people identified as African-American.      Creatinine, Ser  Date Value Ref Range Status  03/06/2024 2.06 (H) 0.57 - 1.00 mg/dL Final   Creatinine, POC  Date Value Ref Range Status  09/11/2023 77.0 mg/dL Final    Comment:    Abstracted by  HIM   Creatinine, Urine  Date Value Ref Range Status  10/05/2016 116 20 - 320 mg/dL Final         Passed - HBA1C is between 0 and 7.9 and within 180 days    HbA1c, POC (controlled diabetic range)  Date Value Ref Range Status  03/06/2024 6.6 0.0 - 7.0 % Final         Passed - Valid encounter within last 6 months    Recent Outpatient Visits           5 months ago Type 2 diabetes mellitus with diabetic nephropathy, without long-term current use of insulin  (HCC)   [place] [facility] [place]. [facility] Delbert Clam, MD   1 year ago Type 2 diabetes mellitus with diabetic nephropathy, without long-term current use of insulin  [facility])   [place] [facility] [place]. [facility] [place], [place], [place]   1 year ago Type 2 diabetes mellitus with diabetic nephropathy, without long-term current use of insulin  [facility])   [place] [facility] Of [place]. [facility] Delbert Clam, MD   2 years ago Type 2 diabetes mellitus with diabetic nephropathy, without long-term current use of insulin  [facility])   [place] [facility] Of [place]. [facility] Delbert Clam, MD   2 years ago Type 2 diabetes mellitus with diabetic nephropathy, without long-term current use of insulin  [facility])    [facility] [place]. [facility], Jon M, PA-C               amLODipine  (NORVASC ) 10 MG tablet [Pharmacy Med Name: AMLODIPINE  BESYLATE 10 MG Oral Tablet] 90 tablet     Sig: TAKE 1 TABLET EVERY DAY     Cardiovascular: Calcium Channel Blockers 2 Passed - 08/26/2024  7:48 AM      Passed - Last BP in normal range    BP Readings from Last 1 Encounters:  03/06/24 129/61         Passed - Last Heart Rate in normal range    Pulse Readings from Last 1 Encounters:  03/06/24 67         Passed - Valid encounter within last 6 months    Recent Outpatient Visits           5 months ago Type 2 diabetes mellitus with diabetic nephropathy, without long-term  current use of insulin  [facility])   [place] [facility] - [facility] Of [place]. [facility] Delbert Clam, MD   1 year ago Type 2 diabetes mellitus with diabetic nephropathy, without long-term current use of insulin  [facility])   [place] [facility] Of [place]. [facility] [place], [place], [place]   1 year ago Type 2 diabetes mellitus with diabetic nephropathy, without long-term current use of insulin  [facility])   [place] [facility] [place]. [facility] Delbert Clam, MD   2 years ago Type 2 diabetes mellitus with diabetic nephropathy, without long-term current use of insulin  [facility])   [place] [facility] [place]. [facility] Delbert Clam, MD   2 years ago Type 2 diabetes mellitus with diabetic nephropathy, without long-term current use of insulin  [facility])   [facility] - A Dept Of . [facility] [place], [place], PA-C

## 2024-08-27 DIAGNOSIS — N184 Chronic kidney disease, stage 4 (severe): Secondary | ICD-10-CM | POA: Diagnosis not present

## 2024-08-27 DIAGNOSIS — I129 Hypertensive chronic kidney disease with stage 1 through stage 4 chronic kidney disease, or unspecified chronic kidney disease: Secondary | ICD-10-CM | POA: Diagnosis not present

## 2024-08-27 DIAGNOSIS — D631 Anemia in chronic kidney disease: Secondary | ICD-10-CM | POA: Diagnosis not present

## 2024-08-27 DIAGNOSIS — N1832 Chronic kidney disease, stage 3b: Secondary | ICD-10-CM | POA: Diagnosis not present

## 2024-08-27 DIAGNOSIS — E1122 Type 2 diabetes mellitus with diabetic chronic kidney disease: Secondary | ICD-10-CM | POA: Diagnosis not present

## 2024-09-03 ENCOUNTER — Telehealth: Payer: Self-pay | Admitting: Family Medicine

## 2024-09-03 NOTE — Telephone Encounter (Signed)
 Called pt, pt will be present at appt.

## 2024-09-05 ENCOUNTER — Telehealth: Payer: Self-pay | Admitting: Family Medicine

## 2024-09-05 NOTE — Telephone Encounter (Signed)
 1st attempt phone kept ringing no lvm to leave a message to resch appt

## 2024-09-08 ENCOUNTER — Ambulatory Visit: Admitting: Family Medicine

## 2024-09-22 ENCOUNTER — Encounter: Payer: Self-pay | Admitting: Family Medicine

## 2024-09-22 ENCOUNTER — Ambulatory Visit: Attending: Family Medicine | Admitting: Family Medicine

## 2024-09-22 VITALS — BP 134/66 | HR 69 | Ht 61.0 in | Wt 135.6 lb

## 2024-09-22 DIAGNOSIS — H269 Unspecified cataract: Secondary | ICD-10-CM

## 2024-09-22 DIAGNOSIS — E1121 Type 2 diabetes mellitus with diabetic nephropathy: Secondary | ICD-10-CM | POA: Diagnosis not present

## 2024-09-22 DIAGNOSIS — Z7985 Long-term (current) use of injectable non-insulin antidiabetic drugs: Secondary | ICD-10-CM | POA: Diagnosis not present

## 2024-09-22 DIAGNOSIS — E785 Hyperlipidemia, unspecified: Secondary | ICD-10-CM | POA: Diagnosis not present

## 2024-09-22 DIAGNOSIS — N184 Chronic kidney disease, stage 4 (severe): Secondary | ICD-10-CM

## 2024-09-22 DIAGNOSIS — I129 Hypertensive chronic kidney disease with stage 1 through stage 4 chronic kidney disease, or unspecified chronic kidney disease: Secondary | ICD-10-CM | POA: Diagnosis not present

## 2024-09-22 DIAGNOSIS — Z7984 Long term (current) use of oral hypoglycemic drugs: Secondary | ICD-10-CM

## 2024-09-22 DIAGNOSIS — E1122 Type 2 diabetes mellitus with diabetic chronic kidney disease: Secondary | ICD-10-CM

## 2024-09-22 LAB — POCT GLYCOSYLATED HEMOGLOBIN (HGB A1C): HbA1c, POC (controlled diabetic range): 5.8 % (ref 0.0–7.0)

## 2024-09-22 MED ORDER — HYDROCHLOROTHIAZIDE 12.5 MG PO TABS: 12.5000 mg | ORAL_TABLET | Freq: Every day | ORAL | 1 refills | Status: AC

## 2024-09-22 MED ORDER — ISOSORBIDE MONONITRATE ER 60 MG PO TB24: 60.0000 mg | ORAL_TABLET | Freq: Every day | ORAL | 1 refills | Status: AC

## 2024-09-22 MED ORDER — AMLODIPINE BESYLATE 10 MG PO TABS: 10.0000 mg | ORAL_TABLET | Freq: Every day | ORAL | 1 refills | Status: AC

## 2024-09-22 MED ORDER — OZEMPIC (0.25 OR 0.5 MG/DOSE) 2 MG/3ML ~~LOC~~ SOPN: 0.2500 mg | PEN_INJECTOR | SUBCUTANEOUS | 6 refills | Status: AC

## 2024-09-22 MED ORDER — SIMVASTATIN 10 MG PO TABS: 10.0000 mg | ORAL_TABLET | Freq: Every day | ORAL | 1 refills | Status: AC

## 2024-09-22 MED ORDER — GLIPIZIDE 10 MG PO TABS: 10.0000 mg | ORAL_TABLET | Freq: Two times a day (BID) | ORAL | 1 refills | Status: AC

## 2024-09-22 NOTE — Patient Instructions (Signed)
 VISIT SUMMARY:  Today, you had a follow-up visit to discuss your diabetes, hypertension, chronic kidney disease, and other health concerns. Your diabetes is well-controlled, and your A1c has improved. We also discussed your upcoming cataract surgery and smoking habits.  YOUR PLAN:  -CHRONIC KIDNEY DISEASE STAGE 4: Chronic kidney disease stage 4 means your kidneys are significantly damaged and not working as well as they should. We have reduced your hydrochlorothiazide  dose to minimize its impact on your kidneys. Please follow up with your nephrologist in three months and get your kidney function labs done as ordered.  -TYPE 2 DIABETES MELLITUS: Type 2 diabetes means your body does not use insulin  properly. Your diabetes is well-controlled with an A1c of 5.8. Continue taking Ozempic  and Glipizide  as prescribed. Monitor for any symptoms of low blood sugar and let us  know if you experience any issues.  -HYPERTENSION: Hypertension means high blood pressure. Continue taking your current medications to manage your blood pressure.  -HYPERLIPIDEMIA: Hyperlipidemia means you have high levels of fats in your blood. Continue taking your current medications, and we have ordered labs to check your cholesterol levels.  -CATARACTS: Cataracts are a clouding of the eye's lens, leading to vision problems. Your surgery was postponed due to financial reasons, so please reschedule it when you can.  -NICOTINE DEPENDENCE (SMOKER): Nicotine dependence means you are addicted to smoking. You have reduced your smoking frequency, which is good. We discussed smoking cessation strategies and offered support, but you declined cessation aids due to weight concerns.  INSTRUCTIONS:  Please follow up with your nephrologist in three months. Reschedule your cataract surgery when possible. Get your kidney function and cholesterol labs done as ordered.

## 2024-09-22 NOTE — Progress Notes (Signed)
 Subjective:  Patient ID: Andrea Brown, female    DOB: February 19, 1951  Age: 73 y.o. MRN: [id number]  CC: Medical Management of Chronic Issues     Discussed the use of AI scribe software for clinical note transcription with the patient, who gave verbal consent to proceed.  History of Present Illness Andrea Brown is a 73 year old female with a history of Diabetes Mellitus Type 2 (A1c 5.9), Hypertension, Hyperlipidemia, CKD stage 4 and tobacco use who presents for a follow-up visit.  She requires cataract surgery for both eyes, with the right eye more affected. The surgery was postponed due to unexpected costs, and she plans to reschedule. Her ophthalmologist found no signs of diabetic retinopathy.  Diabetes is managed with Ozempic  weekly and glipizide  10 mg daily. Her A1c improved to 5.8 from 6.6, attributed to weight loss and regular exercise, including walking 30-40 minutes daily. She experiences no hypoglycemia, lightheadedness, or dizziness. She has not eaten yet today but plans to eat after the visit.  Hypertension is managed with hydrochlorothiazide , recently reduced from 25 mg to 12.5 mg due to potential kidney effects and she is currently on other antihypertensives. She recently changed nephrologists and is now under the care of Dr. Jerrye, with a follow-up scheduled in March to monitor kidney function.  She smokes but has reduced her intake and is not interested in cessation aids due to concerns about weight gain. She is comfortable with her current weight of 135 pounds and does not want to exceed 150 pounds.  No numbness in her hands or feet and no issues with her current medication regimen aside from the recent change in hydrochlorothiazide  dosage.    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   Diabetes mellitus without complication (HCC)    Hyperlipidemia    Hypertension    Papilloma of right breast    Smoker     Past  Surgical History:  Procedure Laterality Date   BREAST EXCISIONAL BIOPSY     BREAST LUMPECTOMY WITH RADIOACTIVE SEED LOCALIZATION Right 02/23/2020   Procedure: RIGHT BREAST LUMPECTOMY X 3 WITH RADIOACTIVE SEED LOCALIZATION;  Surgeon: Curvin Deward MOULD, MD;  Location: [facility];  Service: General;  Laterality: Right;   TOTAL ABDOMINAL HYSTERECTOMY  2000    Family History  Problem Relation Age of Onset   Diabetes Mother    Hypertension Mother    Colon cancer Neg Hx    Esophageal cancer Neg Hx    Pancreatic cancer Neg Hx    Rectal cancer Neg Hx    Stomach cancer Neg Hx     Social History   Socioeconomic History   Marital status: Single    Spouse name: Not on file   Number of children: Not on file   Years of education: Not on file   Highest education level: Not on file  Occupational History   Not on file  Tobacco Use   Smoking status: Every Day    Current packs/day: 0.25    Types: Cigarettes   Smokeless tobacco: Never   Tobacco comments:    patient is trying EOD  Substance and Sexual Activity   Alcohol use: No   Drug use: No   Sexual activity: Not Currently    Birth control/protection: Surgical  Other Topics Concern   Not on file  Social History Narrative   Not on file   Social Drivers of Health   Financial  Resource Strain: Low Risk  (05/06/2024)   Overall Financial Resource Strain (CARDIA)    Difficulty of Paying Living Expenses: Not hard at all  Food Insecurity: No Food Insecurity (05/06/2024)   Hunger Vital Sign    Worried About Running Out of Food in the Last Year: Never true    Ran Out of Food in the Last Year: Never true  Transportation Needs: No Transportation Needs (05/06/2024)   PRAPARE - Administrator, Civil Service (Medical): No    Lack of Transportation (Non-Medical): No  Physical Activity: Sufficiently Active (05/06/2024)   Exercise Vital Sign    Days of Exercise per Week: 5 days    Minutes of Exercise per Session: 30 min  Stress:  No Stress Concern Present (05/06/2024)   [employer] of Occupational Health - Occupational Stress Questionnaire    Feeling of Stress : Not at all  Social Connections: Moderately Isolated (05/06/2024)   Social Connection and Isolation Panel    Frequency of Communication with Friends and Family: More than three times a week    Frequency of Social Gatherings with Friends and Family: Three times a week    Attends Religious Services: 1 to 4 times per year    Active Member of Clubs or Organizations: No    Attends Banker Meetings: Never    Marital Status: Divorced    No Known Allergies  Outpatient Medications Prior to Visit  Medication Sig Dispense Refill   Accu-Chek Softclix Lancets lancets USE AS INSTRUCTED 200 each 3   aspirin  EC 81 MG tablet Take 1 tablet (81 mg total) by mouth daily. Swallow whole. 120 tablet 3   Blood Glucose Monitoring Suppl (ACCU-CHEK GUIDE ME) w/Device KIT 1 each by Does not apply route 3 (three) times daily before meals. 1 kit 0   glucose blood (ACCU-CHEK GUIDE) test strip USE AS INSTRUCTED 1 TO 2 TIMES PER DAY 200 strip 3   Misc. Devices MISC Blood pressure monitor; diagnosis-hypertension 1 each 0   amLODipine  (NORVASC ) 10 MG tablet TAKE 1 TABLET EVERY DAY 90 tablet 0   glipiZIDE  (GLUCOTROL ) 10 MG tablet TAKE 1 TABLET TWICE DAILY BEFORE MEALS 180 tablet 0   hydrochlorothiazide  (HYDRODIURIL ) 25 MG tablet Take 1 tablet (25 mg total) by mouth daily. 90 tablet 1   isosorbide  mononitrate (IMDUR ) 60 MG 24 hr tablet Take 1 tablet (60 mg total) by mouth daily. 90 tablet 1   Semaglutide ,0.25 or 0.5MG /DOS, (OZEMPIC , 0.25 OR 0.5 MG/DOSE,) 2 MG/3ML SOPN Inject 0.25 mg into the skin once a week. 3 mL 6   simvastatin  (ZOCOR ) 10 MG tablet Take 1 tablet (10 mg total) by mouth at bedtime. 90 tablet 1   No facility-administered medications prior to visit.     ROS Review of Systems  Constitutional:  Negative for activity change and appetite change.  HENT:   Negative for sinus pressure and sore throat.   Eyes:  Positive for visual disturbance.  Respiratory:  Negative for chest tightness, shortness of breath and wheezing.   Cardiovascular:  Negative for chest pain and palpitations.  Gastrointestinal:  Negative for abdominal distention, abdominal pain and constipation.  Genitourinary: Negative.   Musculoskeletal: Negative.   Psychiatric/Behavioral:  Negative for behavioral problems and dysphoric mood.     Objective:  BP 134/66   Pulse 69   Ht 5' 1 (1.549 m)   Wt 135 lb 9.6 oz (61.5 kg)   SpO2 97%   BMI 25.62 kg/m      09/22/2024  8:45 AM 05/06/2024    9:02 AM 03/06/2024   11:08 AM  BP/Weight  Systolic BP 134  870  Diastolic BP 66  61  Wt. (Lbs) 135.6 142   BMI 25.62 kg/m2 26.83 kg/m2       Physical Exam Constitutional:      Appearance: She is well-developed.  Cardiovascular:     Rate and Rhythm: Normal rate.     Heart sounds: Normal heart sounds. No murmur heard. Pulmonary:     Effort: Pulmonary effort is normal.     Breath sounds: Normal breath sounds. No wheezing or rales.  Chest:     Chest wall: No tenderness.  Abdominal:     General: Bowel sounds are normal. There is no distension.     Palpations: Abdomen is soft. There is no mass.     Tenderness: There is no abdominal tenderness.  Musculoskeletal:        General: Normal range of motion.     Right lower leg: No edema.     Left lower leg: No edema.  Neurological:     Mental Status: She is alert and oriented to person, place, and time.  Psychiatric:        Mood and Affect: Mood normal.        Latest Ref Rng & Units 03/06/2024   11:23 AM 07/24/2023    9:43 AM 01/23/2023    9:03 AM  CMP  Glucose 70 - 99 mg/dL 886  862  73   BUN 8 - 27 mg/dL 45  56  33   Creatinine 0.57 - 1.00 mg/dL 7.93  7.74  7.88   Sodium 134 - 144 mmol/L 144  141  143   Potassium 3.5 - 5.2 mmol/L 4.7  4.3  4.2   Chloride 96 - 106 mmol/L 106  98  106   CO2 20 - 29 mmol/L 22  27  22     Calcium 8.7 - 10.3 mg/dL 9.5  9.1  9.5   Total Protein 6.0 - 8.5 g/dL 7.5  7.2  7.4   Total Bilirubin 0.0 - 1.2 mg/dL 0.2  0.2  0.2   Alkaline Phos 44 - 121 IU/L 69  70  68   AST 0 - 40 IU/L 18  20  19    ALT 0 - 32 IU/L 8  10  9      Lipid Panel     Component Value Date/Time   CHOL 157 03/06/2024 1123   TRIG 66 03/06/2024 1123   HDL 62 03/06/2024 1123   CHOLHDL 2.6 07/24/2023 0943   CHOLHDL 3.7 10/05/2016 1048   VLDL 19 10/05/2016 1048   LDLCALC 82 03/06/2024 1123   LDLDIRECT 40 09/09/2009 2145    CBC    Component Value Date/Time   WBC 7.3 07/24/2023 0943   WBC 5.5 10/05/2016 1048   RBC 3.96 07/24/2023 0943   RBC 3.97 10/05/2016 1048   HGB 11.1 07/24/2023 0943   HCT 33.4 (L) 07/24/2023 0943   PLT 252 07/24/2023 0943   MCV 84 07/24/2023 0943   MCH 28.0 07/24/2023 0943   MCH 28.2 10/05/2016 1048   MCHC 33.2 07/24/2023 0943   MCHC 32.7 10/05/2016 1048   RDW 14.0 07/24/2023 0943   LYMPHSABS 2.4 07/24/2023 0943   MONOABS 275 10/05/2016 1048   EOSABS 0.3 07/24/2023 0943   BASOSABS 0.0 07/24/2023 0943    Lab Results  Component Value Date   HGBA1C 5.8 09/22/2024    Lab Results  Component Value  Date   HGBA1C 5.8 09/22/2024   HGBA1C 6.6 03/06/2024   HGBA1C 5.9 (H) 07/24/2023       Assessment & Plan Chronic kidney disease stage 4 Managed with nephrology follow-up. Hydrochlorothiazide  dose reduced to minimize renal impact. - Follow-up with nephrologist in three months. - Refill Hydrochlorothiazide  12.5 mg. - Order labs for kidney function.  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 On Ozempic  for weight management. No diabetic retinopathy. - Continue Ozempic  and Glipizide . - Monitor for hypoglycemia symptoms; adjust Glipizide  if needed.  Hypertension associated with stage IV chronic kidney disease secondary to type 2 diabetes mellitus -Controlled Management continues with current  medications.  Hyperlipidemia Management continues with current medications. - Order labs for cholesterol levels. - Low-cholesterol diet  Cataracts Surgery postponed due to financial reasons. No diabetic retinopathy. - Reschedule cataract surgery.  Nicotine dependence (smoker) Reduced smoking frequency. Declined pharmacological cessation aids due to weight concerns. - Discuss smoking cessation strategies and offer support.     Healthcare maintenance Colonoscopy no longer indicated due to age  Meds ordered this encounter  Medications   hydrochlorothiazide  (HYDRODIURIL ) 12.5 MG tablet    Sig: Take 1 tablet (12.5 mg total) by mouth daily.    Dispense:  45 tablet    Refill:  1    Dose Decrease   amLODipine  (NORVASC ) 10 MG tablet    Sig: Take 1 tablet (10 mg total) by mouth daily.    Dispense:  90 tablet    Refill:  1   glipiZIDE  (GLUCOTROL ) 10 MG tablet    Sig: Take 1 tablet (10 mg total) by mouth 2 (two) times daily before a meal.    Dispense:  180 tablet    Refill:  1   isosorbide  mononitrate (IMDUR ) 60 MG 24 hr tablet    Sig: Take 1 tablet (60 mg total) by mouth daily.    Dispense:  90 tablet    Refill:  1   Semaglutide ,0.25 or 0.5MG /DOS, (OZEMPIC , 0.25 OR 0.5 MG/DOSE,) 2 MG/3ML SOPN    Sig: Inject 0.25 mg into the skin once a week.    Dispense:  3 mL    Refill:  6   simvastatin  (ZOCOR ) 10 MG tablet    Sig: Take 1 tablet (10 mg total) by mouth at bedtime.    Dispense:  90 tablet    Refill:  1    Follow-up: Return in about 6 months (around 03/23/2025) for Chronic medical conditions.       Corrina Sabin, MD, FAAFP. [facility] [place], [place] [phone]   09/22/2024, 9:12 AM

## 2024-09-23 ENCOUNTER — Ambulatory Visit: Payer: Self-pay | Admitting: Family Medicine

## 2024-09-23 LAB — CMP14+EGFR
ALT: 6 IU/L (ref 0–32)
AST: 21 IU/L (ref 0–40)
Albumin: 4.6 g/dL (ref 3.8–4.8)
Alkaline Phosphatase: 65 IU/L (ref 49–135)
BUN/Creatinine Ratio: 17 (ref 12–28)
BUN: 40 mg/dL — ABNORMAL HIGH (ref 8–27)
Bilirubin Total: 0.3 mg/dL (ref 0.0–1.2)
CO2: 22 mmol/L (ref 20–29)
Calcium: 9.5 mg/dL (ref 8.7–10.3)
Chloride: 107 mmol/L — ABNORMAL HIGH (ref 96–106)
Creatinine, Ser: 2.33 mg/dL — ABNORMAL HIGH (ref 0.57–1.00)
Globulin, Total: 2.5 g/dL (ref 1.5–4.5)
Glucose: 143 mg/dL — ABNORMAL HIGH (ref 70–99)
Potassium: 4.2 mmol/L (ref 3.5–5.2)
Sodium: 144 mmol/L (ref 134–144)
Total Protein: 7.1 g/dL (ref 6.0–8.5)
eGFR: 21 mL/min/1.73 — ABNORMAL LOW (ref >59)

## 2024-09-23 LAB — LP+NON-HDL CHOLESTEROL
Cholesterol, Total: 156 mg/dL (ref 100–199)
HDL: 59 mg/dL (ref >39)
LDL Chol Calc (NIH): 83 mg/dL (ref 0–99)
Total Non-HDL-Chol (LDL+VLDL): 97 mg/dL (ref 0–129)
Triglycerides: 72 mg/dL (ref 0–149)
VLDL Cholesterol Cal: 14 mg/dL (ref 5–40)

## 2025-01-09 NOTE — Progress Notes (Signed)
 Andrea Brown                                          MRN: [id number]   01/09/2025   The [facility] Quality Team Specialist reviewed this patient medical record for the purposes of chart review for care gap closure. The following were reviewed: chart review for care gap closure-colorectal cancer screening.    [facility] Quality Team

## 2025-03-23 ENCOUNTER — Ambulatory Visit: Admitting: Family Medicine

## 2025-05-12 ENCOUNTER — Ambulatory Visit
